# Patient Record
Sex: Male | Born: 1955 | Race: White | Hispanic: No | Marital: Married | Smoking: Former smoker
Health system: Southern US, Community
[De-identification: ages and names within clinical notes are randomized; demographics above are authoritative.]

## PROBLEM LIST (undated history)

## (undated) DIAGNOSIS — R338 Other retention of urine: Secondary | ICD-10-CM

## (undated) DIAGNOSIS — I10 Essential (primary) hypertension: Secondary | ICD-10-CM

## (undated) DIAGNOSIS — T4145XA Adverse effect of unspecified anesthetic, initial encounter: Secondary | ICD-10-CM

## (undated) DIAGNOSIS — R768 Other specified abnormal immunological findings in serum: Secondary | ICD-10-CM

## (undated) DIAGNOSIS — Z8679 Personal history of other diseases of the circulatory system: Secondary | ICD-10-CM

## (undated) DIAGNOSIS — Z960 Presence of urogenital implants: Secondary | ICD-10-CM

## (undated) DIAGNOSIS — N401 Enlarged prostate with lower urinary tract symptoms: Secondary | ICD-10-CM

## (undated) DIAGNOSIS — T8859XA Other complications of anesthesia, initial encounter: Secondary | ICD-10-CM

## (undated) DIAGNOSIS — Z860100 Personal history of colon polyps, unspecified: Secondary | ICD-10-CM

## (undated) DIAGNOSIS — Z978 Presence of other specified devices: Secondary | ICD-10-CM

## (undated) HISTORY — PX: INGUINAL HERNIA REPAIR: SUR1180

## (undated) HISTORY — DX: Essential (primary) hypertension: I10

## (undated) HISTORY — PX: ANAL FISSURE REPAIR: SHX2312

## (undated) HISTORY — PX: COLONOSCOPY WITH PROPOFOL: SHX5780

---

## 1999-08-27 ENCOUNTER — Emergency Department (HOSPITAL_COMMUNITY): Admission: EM | Admit: 1999-08-27 | Discharge: 1999-08-27 | Payer: Self-pay | Admitting: Emergency Medicine

## 2000-01-14 ENCOUNTER — Emergency Department (HOSPITAL_COMMUNITY): Admission: EM | Admit: 2000-01-14 | Discharge: 2000-01-14 | Payer: Self-pay

## 2000-07-31 ENCOUNTER — Encounter: Payer: Self-pay | Admitting: Neurology

## 2000-07-31 ENCOUNTER — Encounter: Admission: RE | Admit: 2000-07-31 | Discharge: 2000-07-31 | Payer: Self-pay | Admitting: Neurology

## 2003-11-17 ENCOUNTER — Observation Stay (HOSPITAL_COMMUNITY): Admission: EM | Admit: 2003-11-17 | Discharge: 2003-11-18 | Payer: Self-pay | Admitting: Emergency Medicine

## 2006-07-02 ENCOUNTER — Ambulatory Visit: Payer: Self-pay | Admitting: Family Medicine

## 2006-10-28 ENCOUNTER — Emergency Department (HOSPITAL_COMMUNITY): Admission: EM | Admit: 2006-10-28 | Discharge: 2006-10-28 | Payer: Self-pay | Admitting: Emergency Medicine

## 2008-05-18 ENCOUNTER — Emergency Department (HOSPITAL_COMMUNITY): Admission: EM | Admit: 2008-05-18 | Discharge: 2008-05-18 | Payer: Self-pay | Admitting: Emergency Medicine

## 2013-03-05 ENCOUNTER — Other Ambulatory Visit (HOSPITAL_COMMUNITY): Payer: Self-pay | Admitting: Urology

## 2013-03-05 DIAGNOSIS — D49519 Neoplasm of unspecified behavior of unspecified kidney: Secondary | ICD-10-CM

## 2013-03-12 ENCOUNTER — Ambulatory Visit (HOSPITAL_COMMUNITY): Admission: RE | Admit: 2013-03-12 | Payer: BC Managed Care – PPO | Source: Ambulatory Visit

## 2013-11-10 LAB — HM COLONOSCOPY

## 2013-12-01 DIAGNOSIS — M5136 Other intervertebral disc degeneration, lumbar region: Secondary | ICD-10-CM | POA: Insufficient documentation

## 2013-12-01 DIAGNOSIS — M51369 Other intervertebral disc degeneration, lumbar region without mention of lumbar back pain or lower extremity pain: Secondary | ICD-10-CM | POA: Insufficient documentation

## 2013-12-01 DIAGNOSIS — M5416 Radiculopathy, lumbar region: Secondary | ICD-10-CM | POA: Insufficient documentation

## 2015-05-15 ENCOUNTER — Encounter (HOSPITAL_COMMUNITY): Payer: Self-pay | Admitting: *Deleted

## 2015-05-15 ENCOUNTER — Emergency Department (HOSPITAL_COMMUNITY)
Admission: EM | Admit: 2015-05-15 | Discharge: 2015-05-15 | Disposition: A | Discharge status: Home / Self Care | Payer: BLUE CROSS/BLUE SHIELD | Attending: Emergency Medicine | Admitting: Emergency Medicine

## 2015-05-15 DIAGNOSIS — R339 Retention of urine, unspecified: Secondary | ICD-10-CM | POA: Diagnosis not present

## 2015-05-15 DIAGNOSIS — F17200 Nicotine dependence, unspecified, uncomplicated: Secondary | ICD-10-CM | POA: Insufficient documentation

## 2015-05-15 DIAGNOSIS — N401 Enlarged prostate with lower urinary tract symptoms: Secondary | ICD-10-CM | POA: Insufficient documentation

## 2015-05-15 DIAGNOSIS — R300 Dysuria: Secondary | ICD-10-CM | POA: Diagnosis present

## 2015-05-15 DIAGNOSIS — N400 Enlarged prostate without lower urinary tract symptoms: Secondary | ICD-10-CM

## 2015-05-15 LAB — COMPREHENSIVE METABOLIC PANEL
ALBUMIN: 4.2 g/dL (ref 3.5–5.0)
ALK PHOS: 63 U/L (ref 38–126)
ALT: 147 U/L — AB (ref 17–63)
ANION GAP: 11 (ref 5–15)
AST: 84 U/L — AB (ref 15–41)
BUN: 10 mg/dL (ref 6–20)
CHLORIDE: 103 mmol/L (ref 101–111)
CO2: 20 mmol/L — ABNORMAL LOW (ref 22–32)
Calcium: 9 mg/dL (ref 8.9–10.3)
Creatinine, Ser: 0.87 mg/dL (ref 0.61–1.24)
GFR calc Af Amer: >60 mL/min (ref >60)
GFR calc non Af Amer: >60 mL/min (ref >60)
Glucose, Bld: 158 mg/dL — ABNORMAL HIGH (ref 65–99)
Potassium: 3.7 mmol/L (ref 3.5–5.1)
SODIUM: 134 mmol/L — AB (ref 135–145)
Total Bilirubin: 1.5 mg/dL — ABNORMAL HIGH (ref 0.3–1.2)
Total Protein: 8 g/dL (ref 6.5–8.1)

## 2015-05-15 LAB — CBC WITH DIFFERENTIAL/PLATELET
BASOS ABS: 0 10*3/uL (ref 0.0–0.1)
BASOS PCT: 0 %
EOS ABS: 0 10*3/uL (ref 0.0–0.7)
Eosinophils Relative: 0 %
HCT: 43 % (ref 39.0–52.0)
HEMOGLOBIN: 15.4 g/dL (ref 13.0–17.0)
Lymphocytes Relative: 13 %
Lymphs Abs: 0.9 10*3/uL (ref 0.7–4.0)
MCH: 33.3 pg (ref 26.0–34.0)
MCHC: 35.8 g/dL (ref 30.0–36.0)
MCV: 93.1 fL (ref 78.0–100.0)
Monocytes Absolute: 0.2 10*3/uL (ref 0.1–1.0)
Monocytes Relative: 2 %
NEUTROS PCT: 85 %
Neutro Abs: 6.1 10*3/uL (ref 1.7–7.7)
Platelets: 160 10*3/uL (ref 150–400)
RBC: 4.62 MIL/uL (ref 4.22–5.81)
RDW: 12.3 % (ref 11.5–15.5)
WBC: 7.2 10*3/uL (ref 4.0–10.5)

## 2015-05-15 LAB — URINALYSIS, ROUTINE W REFLEX MICROSCOPIC
Bilirubin Urine: NEGATIVE
Glucose, UA: NEGATIVE mg/dL
Ketones, ur: 40 mg/dL — AB
LEUKOCYTES UA: NEGATIVE
NITRITE: NEGATIVE
Protein, ur: NEGATIVE mg/dL
SPECIFIC GRAVITY, URINE: 1.019 (ref 1.005–1.030)
pH: 6 (ref 5.0–8.0)

## 2015-05-15 LAB — URINE MICROSCOPIC-ADD ON

## 2015-05-15 MED ORDER — OXYCODONE-ACETAMINOPHEN 5-325 MG PO TABS: 1.0000 | ORAL_TABLET | Freq: Once | ORAL | Status: DC

## 2015-05-15 MED ORDER — ONDANSETRON 4 MG PO TBDP
8.0000 mg | ORAL_TABLET | Freq: Once | ORAL | Status: AC
  Administered 2015-05-15: 8 mg via ORAL
  Filled 2015-05-15: qty 2

## 2015-05-15 MED ORDER — KETOROLAC TROMETHAMINE 30 MG/ML IJ SOLN
60.0000 mg | Freq: Once | INTRAMUSCULAR | Status: DC
  Filled 2015-05-15: qty 2

## 2015-05-15 NOTE — Discharge Instructions (Signed)
Continue your Terazosin.  Follow up with Urology.  Acute Urinary Retention, Male Acute urinary retention is the temporary inability to urinate. This is a common problem in older men. As men age their prostates become larger and block the flow of urine from the bladder. This is usually a problem that has come on gradually.  HOME CARE INSTRUCTIONS If you are sent home with a Foley catheter and a drainage system, you will need to discuss the best course of action with your health care provider. While the catheter is in, maintain a good intake of fluids. Keep the drainage bag emptied and lower than your catheter. This is so that contaminated urine will not flow back into your bladder, which could lead to a urinary tract infection. There are two main types of drainage bags. One is a large bag that usually is used at night. It has a good capacity that will allow you to sleep through the night without having to empty it. The second type is called a leg bag. It has a smaller capacity, so it needs to be emptied more frequently. However, the main advantage is that it can be attached by a leg strap and can go underneath your clothing, allowing you the freedom to move about or leave your home. Only take over-the-counter or prescription medicines for pain, discomfort, or fever as directed by your health care provider.  SEEK MEDICAL CARE IF:  You develop a low-grade fever.  You experience spasms or leakage of urine with the spasms. SEEK IMMEDIATE MEDICAL CARE IF:  1. You develop chills or fever. 2. Your catheter stops draining urine. 3. Your catheter falls out. 4. You start to develop increased bleeding that does not respond to rest and increased fluid intake. MAKE SURE YOU: 1. Understand these instructions. 2. Will watch your condition. 3. Will get help right away if you are not doing well or get worse.   This information is not intended to replace advice given to you by your health care provider. Make  sure you discuss any questions you have with your health care provider.   Document Released: 08/21/2000 Document Revised: 09/29/2014 Document Reviewed: 10/24/2012 Elsevier Interactive Patient Education 2016 [employer]  Benign Prostatic Hypertrophy The prostate gland is part of the reproductive system of men. A normal prostate is about the size and shape of a walnut. The prostate gland produces a fluid that is mixed with sperm to make semen. This gland surrounds the urethra and is located in front of the rectum and just below the bladder. The bladder is where urine is stored. The urethra is the tube through which urine passes from the bladder to get out of the body. The prostate grows as a man ages. An enlarged prostate not caused by cancer is called benign prostatic hypertrophy (BPH). An enlarged prostate can press on the urethra. This can make it harder to pass urine. In the early stages of enlargement, the bladder can get by with a narrowed urethra by forcing the urine through. If the problem gets worse, medical or surgical treatment may be required.  This condition should be followed by your health care provider. The accumulation of urine in the bladder can cause infection. Back pressure and infection can progress to bladder damage and kidney (renal) failure. If needed, your health care provider may refer you to a specialist in kidney and prostate disease (urologist). CAUSES  BPH is a common health problem in men older than 50 years. This condition is a normal  part of aging. However, not all men will develop problems from this condition. If the enlargement grows away from the urethra, then there will not be any compression of the urethra and resistance to urine flow.If the growth is toward the urethra and compresses it, you will experience difficulty urinating.  SYMPTOMS   Not able to completely empty your bladder.  Getting up often during the night to urinate.  Need to urinate frequently  during the day.  Difficultly starting urine flow.  Decrease in size and strength of your urine stream.  Dribbling after urination.  Pain on urination (more common with infection).  Inability to pass urine. This needs immediate treatment.  The development of a urinary tract infection. DIAGNOSIS  These tests will help your health care provider understand your problem: 5. A thorough history and physical examination. 6. A urination history, with the number of times you urinate, the amounts of urine, the strength of the urine stream, and the feeling of emptiness or fullness after urinating. 7. A postvoid bladder scan that measures any amount of urine that may remain in your bladder after you finish urinating. 8. Digital rectal exam. In a rectal exam, your health care provider checks your prostate by putting a gloved, lubricated finger into your rectum to feel the back of your prostate gland. This exam detects the size of your gland and abnormal lumps or growths. 9. Exam of your urine (urinalysis). 10. Prostate specific antigen (PSA) screening. This is a blood test used to screen for prostate cancer. 11. Rectal ultrasonography. This test uses sound waves to electronically produce a picture of your prostate gland. TREATMENT  Once symptoms begin, your health care provider will monitor your condition. Of the men with this condition, one third will have symptoms that stabilize, one third will have symptoms that improve, and one third will have symptoms that progress in the first year. Mild symptoms may not need treatment. Simple observation and yearly exams may be all that is required. Medicines and surgery are options for more severe problems. Your health care provider can help you make an informed decision for what is best. Two classes of medicines are available for relief of prostate symptoms: 4. Medicines that shrink the prostate. This helps relieve symptoms. These medicines take time to work,  and it may be months before any improvement is seen. 1. Uncommon side effects include problems with sexual function. 5. Medicines to relax the muscle of the prostate. This also relieves the obstruction by reducing any compression on the urethra.This group of medicines work much faster than those that reduce the size of the prostate gland. Usually, one can experience improvement in days to weeks.. 1. Side effects can include dizziness, fatigue, lightheadedness, and retrograde ejaculation (diminished volume of ejaculate). Several types of surgical treatments are available for relief of prostate symptoms: 1. Transurethral resection of the prostate (TURP)--In this treatment, an instrument is inserted through opening at the tip of the penis. It is used to cut away pieces of the inner core of the prostate. The pieces are removed through the same opening of the penis. This removes the obstruction and helps get rid of the symptoms. 2. Transurethral incision (TUIP)--In this procedure, small cuts are made in the prostate. This lessens the prostates pressure on the urethra. 3. Transurethral microwave thermotherapy (TUMT)--This procedure uses microwaves to create heat. The heat destroys and removes a small amount of prostate tissue. 4. Transurethral needle ablation (TUNA)--This is a procedure that uses radio frequencies to do the  same as TUMT. 5. Interstitial laser coagulation (ILC)--This is a procedure that uses a laser to do the same as TUMT and TUNA. 6. Transurethral electrovaporization (TUVP)--This is a procedure that uses electrodes to do the same as the procedures listed above. SEEK MEDICAL CARE IF:  1. You develop a fever. 2. There is unexplained back pain. 3. Symptoms are not helped by medicines prescribed. 4. You develop side effects from the medicine you are taking. 5. Your urine becomes very dark or has a bad smell. 6. Your lower abdomen becomes distended and you have difficulty passing your  urine. SEEK IMMEDIATE MEDICAL CARE IF:   You are suddenly unable to urinate. This is an emergency. You should be seen immediately.  There are large amounts of blood or clots in the urine.  Your urinary problems become unmanageable.  You develop lightheadedness, severe dizziness, or you feel faint.  You develop moderate to severe low back or flank pain.  You develop chills or fever.   This information is not intended to replace advice given to you by your health care provider. Make sure you discuss any questions you have with your health care provider.   Document Released: 05/15/2005 Document Revised: 05/20/2013 Document Reviewed: 11/28/2012 Elsevier Interactive Patient Education 2016 [place], Adult A Foley catheter is a soft, flexible tube that is placed into the bladder to drain urine. A Foley catheter may be inserted if:  You leak urine or are not able to control when you urinate (urinary incontinence).  You are not able to urinate when you need to (urinary retention).  You had prostate surgery or surgery on the genitals.  You have certain medical conditions, such as multiple sclerosis, dementia, or a spinal cord injury. If you are going home with a Foley catheter in place, follow the instructions below. TAKING CARE OF THE CATHETER 12. Wash your hands with soap and water. 13. Using mild soap and warm water on a clean washcloth:  Clean the area on your body closest to the catheter insertion site using a circular motion, moving away from the catheter. Never wipe toward the catheter because this could sweep bacteria up into the urethra and cause infection.  Remove all traces of soap. Pat the area dry with a clean towel. For males, reposition the foreskin. 57. Attach the catheter to your leg so there is no tension on the catheter. Use adhesive tape or a leg strap. If you are using adhesive tape, remove any sticky residue left behind by the previous tape  you used. 15. Keep the drainage bag below the level of the bladder, but keep it off the floor. 16. Check throughout the day to be sure the catheter is working and urine is draining freely. Make sure the tubing does not become kinked. 17. Do not pull on the catheter or try to remove it. Pulling could damage internal tissues. TAKING CARE OF THE DRAINAGE BAGS You will be given two drainage bags to take home. One is a large overnight drainage bag, and the other is a smaller leg bag that fits underneath clothing. You may wear the overnight bag at any time, but you should never wear the smaller leg bag at night. Follow the instructions below for how to empty, change, and clean your drainage bags. Emptying the Drainage Bag You must empty your drainage bag when it is  - full or at least 2-3 times a day. 6. Wash your hands with soap and water. 7. Keep  the drainage bag below your hips, below the level of your bladder. This stops urine from going back into the tubing and into your bladder. 8. Hold the dirty bag over the toilet or a clean container. 9. Open the pour spout at the bottom of the bag and empty the urine into the toilet or container. Do not let the pour spout touch the toilet, container, or any other surface. Doing so can place bacteria on the bag, which can cause an infection. 10. Clean the pour spout with a gauze pad or cotton ball that has rubbing alcohol on it. 11. Close the pour spout. 12. Attach the bag to your leg with adhesive tape or a leg strap. 13. Wash your hands well. Changing the Drainage Bag Change your drainage bag once a month or sooner if it starts to smell bad or look dirty. Below are steps to follow when changing the drainage bag. 7. Wash your hands with soap and water. 8. Pinch off the rubber catheter so that urine does not spill out. 9. Disconnect the catheter tube from the drainage tube at the connection valve. Do not let the tubes touch any surface. 10. Clean the end of  the catheter tube with an alcohol wipe. Use a different alcohol wipe to clean the end of the drainage tube. 11. Connect the catheter tube to the drainage tube of the clean drainage bag. 12. Attach the new bag to the leg with adhesive tape or a leg strap. Avoid attaching the new bag too tightly. 13. Wash your hands well. Cleaning the Drainage Bag 7. Wash your hands with soap and water. 8. Wash the bag in warm, soapy water. 9. Rinse the bag thoroughly with warm water. 10. Fill the bag with a solution of white vinegar and water (1 cup vinegar to 1 qt warm water [.2 L vinegar to 1 L warm water]). Close the bag and soak it for 30 minutes in the solution. 11. Rinse the bag with warm water. 12. Hang the bag to dry with the pour spout open and hanging downward. 13. Store the clean bag (once it is dry) in a clean plastic bag. 14. Wash your hands well. PREVENTING INFECTION  Wash your hands before and after handling your catheter.  Take showers daily and wash the area where the catheter enters your body. Do not take baths. Replace wet leg straps with dry ones, if this applies.  Do not use powders, sprays, or lotions on the genital area. Only use creams, lotions, or ointments as directed by your caregiver.  For females, wipe from front to back after each bowel movement.  Drink enough fluids to keep your urine clear or pale yellow unless you have a fluid restriction.  Do not let the drainage bag or tubing touch or lie on the floor.  Wear cotton underwear to absorb moisture and to keep your skin drier. SEEK MEDICAL CARE IF:   Your urine is cloudy or smells unusually bad.  Your catheter becomes clogged.  You are not draining urine into the bag or your bladder feels full.  Your catheter starts to leak. SEEK IMMEDIATE MEDICAL CARE IF:   You have pain, swelling, redness, or pus where the catheter enters the body.  You have pain in the abdomen, legs, lower back, or bladder.  You have a  fever.  You see blood fill the catheter, or your urine is pink or red.  You have nausea, vomiting, or chills.  Your catheter gets pulled out.  MAKE SURE YOU:   Understand these instructions.  Will watch your condition.  Will get help right away if you are not doing well or get worse.   This information is not intended to replace advice given to you by your health care provider. Make sure you discuss any questions you have with your health care provider.   Document Released: 05/15/2005 Document Revised: 09/29/2013 Document Reviewed: 05/06/2012 Elsevier Interactive Patient Education [employer].

## 2015-05-15 NOTE — ED Notes (Addendum)
Pt states that the pain he is experiencing is making him nauseated.  Pt states that he has been constipated since this morning, pt attempted to give himself an enema, he states that this not relieve his pain or his constipation.

## 2015-05-15 NOTE — ED Notes (Signed)
The pt has had difficulty urinating since 0900am. He has been voiding in small amounts until 2100 and he has not been able to void since then.  Hx of prostate problems

## 2015-05-15 NOTE — ED Notes (Signed)
The pt and the pts wife want a note placed in the chart not to gived narcotics or sedatives of any type

## 2015-05-15 NOTE — ED Provider Notes (Signed)
By signing my name below, I, Irene Pap, attest that this documentation has been prepared under the direction and in the presence of Schleicher, DO. Electronically Signed: Irene Pap, ED Scribe. 05/15/2015. 3:54 AM.  TIME SEEN: 3:50 AM  CHIEF COMPLAINT: dysuria  HPI:  HPI Comments: Douglas Barnes is a 59 y.o. male history of hepatitis C, BPH and previous episode of urinary retention who presents to the Emergency Department complaining of difficulty urinating onset 19 hours ago. Pt has been having difficulty urinating since 9 AM when he was also constipated. He reports giving himself an enema to no relief. He reports that he has a hx of prostate problems and has been taking Terazosin, but reports that it has not worked today. He states that he has had a foley catheter once before. He reports that he has been voiding in small amounts until 9 PM last night, and has been unable to urinate since then. He reports associated nausea and has been taking ibuprofen to no relief. He states now in the ED that he feels much better after an in and out catheter which revealed 700 mL. He denies changes in medication, taking over-the-counter medication of ibuprofen, fever, chills, vomiting, diarrhea, bladder or bowel incontinence, back pain, numbness, weakness, or lightheadedness.  Denies alcohol or acetaminophen use.   ROS: See HPI Constitutional: no fever  Eyes: no drainage  ENT: no runny nose   Cardiovascular:  no chest pain  Resp: no SOB  GI: no vomiting GU: dysuria Integumentary: no rash  Allergy: no hives  Musculoskeletal: no leg swelling  Neurological: no slurred speech ROS otherwise negative  PAST MEDICAL HISTORY/PAST SURGICAL HISTORY:  History reviewed. No pertinent past medical history.  MEDICATIONS:  Prior to Admission medications   Not on File    ALLERGIES:  No Known Allergies  SOCIAL HISTORY:  Social History  Substance Use Topics  . Smoking status: Current Every  Day Smoker  . Smokeless tobacco: Not on file  . Alcohol Use: No    FAMILY HISTORY: No family history on file.  EXAM: BP 156/95 mmHg  Pulse 109  Temp(Src) 98.1 F (36.7 C)  Resp 22  Ht 5\' 8"  (1.727 m)  Wt 167 lb 7 oz (75.949 kg)  BMI 25.46 kg/m2  SpO2 96% CONSTITUTIONAL: Alert and oriented and responds appropriately to questions. Well-appearing; well-nourished HEAD: Normocephalic EYES: Conjunctivae clear, PERRL ENT: normal nose; no rhinorrhea; moist mucous membranes; pharynx without lesions noted NECK: Supple, no meningismus, no LAD  CARD: RRR; S1 and S2 appreciated; no murmurs, no clicks, no rubs, no gallops RESP: Normal chest excursion without splinting or tachypnea; breath sounds clear and equal bilaterally; no wheezes, no rhonchi, no rales, no hypoxia or respiratory distress, speaking full sentences ABD/GI: Normal bowel sounds; non-distended; soft, non-tender, no rebound, no guarding, no peritoneal signs BACK:  The back appears normal and is non-tender to palpation, there is no CVA tenderness EXT: Normal ROM in all joints; non-tender to palpation; no edema; normal capillary refill; no cyanosis, no calf tenderness or swelling    SKIN: Normal color for age and race; warm NEURO: Moves all extremities equally, sensation to light touch intact diffusely, cranial nerves II through XII intact, normal gait PSYCH: The patient's mood and manner are appropriate. Grooming and personal hygiene are appropriate.  MEDICAL DECISION MAKING: Patient here with urinary retention from BPH. Bladder scan showed only 100 mL of urine. Given he appeared uncomfortable, I&O catheterization performed, and patient voided approximate 700 mL.  He  reports feeling much better. Have offered him a Foley catheter which he refuses. Urine shows blood but no other sign of infection other than bacteria. Culture is pending. Hemodynamically stable. Labs show mild elevation of his AST, ALT and total bilirubin which he reports  is chronic because of his hepatitis C. No right upper quadrant tenderness on exam.  ED PROGRESS: Patient reports that he feels that he needs to urinate again and is unable to do so. He is now requesting that we place a Foley catheter. We'll also give him a leg bag. He has urology follow-up. Discussed return precautions and Foley catheter care. He verbalized understanding and is comfortable with this plan.     I personally performed the services described in this documentation, which was scribed in my presence. The recorded information has been reviewed and is accurate.    [place], DO 05/15/15 [phone]

## 2015-05-16 LAB — URINE CULTURE: CULTURE: NO GROWTH

## 2015-05-26 ENCOUNTER — Encounter (HOSPITAL_COMMUNITY): Payer: Self-pay | Admitting: Emergency Medicine

## 2015-05-26 ENCOUNTER — Emergency Department (HOSPITAL_COMMUNITY)
Admission: EM | Admit: 2015-05-26 | Discharge: 2015-05-26 | Disposition: A | Discharge status: Home / Self Care | Payer: BLUE CROSS/BLUE SHIELD | Attending: Emergency Medicine | Admitting: Emergency Medicine

## 2015-05-26 DIAGNOSIS — R7401 Elevation of levels of liver transaminase levels: Secondary | ICD-10-CM

## 2015-05-26 DIAGNOSIS — R110 Nausea: Secondary | ICD-10-CM | POA: Insufficient documentation

## 2015-05-26 DIAGNOSIS — N401 Enlarged prostate with lower urinary tract symptoms: Secondary | ICD-10-CM | POA: Insufficient documentation

## 2015-05-26 DIAGNOSIS — Z87438 Personal history of other diseases of male genital organs: Secondary | ICD-10-CM

## 2015-05-26 DIAGNOSIS — F17200 Nicotine dependence, unspecified, uncomplicated: Secondary | ICD-10-CM | POA: Diagnosis not present

## 2015-05-26 DIAGNOSIS — R339 Retention of urine, unspecified: Secondary | ICD-10-CM | POA: Insufficient documentation

## 2015-05-26 DIAGNOSIS — R740 Nonspecific elevation of levels of transaminase and lactic acid dehydrogenase [LDH]: Secondary | ICD-10-CM

## 2015-05-26 DIAGNOSIS — R000 Tachycardia, unspecified: Secondary | ICD-10-CM | POA: Insufficient documentation

## 2015-05-26 DIAGNOSIS — R1030 Lower abdominal pain, unspecified: Secondary | ICD-10-CM | POA: Diagnosis present

## 2015-05-26 LAB — COMPREHENSIVE METABOLIC PANEL
ALBUMIN: 4.1 g/dL (ref 3.5–5.0)
ALT: 125 U/L — ABNORMAL HIGH (ref 17–63)
ANION GAP: 12 (ref 5–15)
AST: 75 U/L — AB (ref 15–41)
Alkaline Phosphatase: 58 U/L (ref 38–126)
BILIRUBIN TOTAL: 0.8 mg/dL (ref 0.3–1.2)
BUN: 8 mg/dL (ref 6–20)
CO2: 21 mmol/L — AB (ref 22–32)
Calcium: 9.2 mg/dL (ref 8.9–10.3)
Chloride: 107 mmol/L (ref 101–111)
Creatinine, Ser: 0.92 mg/dL (ref 0.61–1.24)
GFR calc Af Amer: >60 mL/min (ref >60)
GFR calc non Af Amer: >60 mL/min (ref >60)
GLUCOSE: 154 mg/dL — AB (ref 65–99)
POTASSIUM: 3.7 mmol/L (ref 3.5–5.1)
SODIUM: 140 mmol/L (ref 135–145)
TOTAL PROTEIN: 8 g/dL (ref 6.5–8.1)

## 2015-05-26 LAB — URINALYSIS, ROUTINE W REFLEX MICROSCOPIC
Bilirubin Urine: NEGATIVE
Glucose, UA: NEGATIVE mg/dL
KETONES UR: 15 mg/dL — AB
LEUKOCYTES UA: NEGATIVE
NITRITE: NEGATIVE
PH: 5 (ref 5.0–8.0)
Protein, ur: NEGATIVE mg/dL
SPECIFIC GRAVITY, URINE: 1.013 (ref 1.005–1.030)

## 2015-05-26 LAB — CBC WITH DIFFERENTIAL/PLATELET
BASOS ABS: 0 10*3/uL (ref 0.0–0.1)
BASOS PCT: 0 %
Eosinophils Absolute: 0 10*3/uL (ref 0.0–0.7)
Eosinophils Relative: 0 %
HEMATOCRIT: 45.1 % (ref 39.0–52.0)
Hemoglobin: 15.7 g/dL (ref 13.0–17.0)
Lymphocytes Relative: 16 %
Lymphs Abs: 1.5 10*3/uL (ref 0.7–4.0)
MCH: 32.5 pg (ref 26.0–34.0)
MCHC: 34.8 g/dL (ref 30.0–36.0)
MCV: 93.4 fL (ref 78.0–100.0)
MONO ABS: 0.5 10*3/uL (ref 0.1–1.0)
Monocytes Relative: 5 %
NEUTROS ABS: 7.4 10*3/uL (ref 1.7–7.7)
NEUTROS PCT: 79 %
Platelets: 173 10*3/uL (ref 150–400)
RBC: 4.83 MIL/uL (ref 4.22–5.81)
RDW: 12.5 % (ref 11.5–15.5)
WBC: 9.4 10*3/uL (ref 4.0–10.5)

## 2015-05-26 LAB — URINE MICROSCOPIC-ADD ON

## 2015-05-26 LAB — LIPASE, BLOOD: Lipase: 27 U/L (ref 11–51)

## 2015-05-26 NOTE — ED Notes (Signed)
Pt from home for eval of urinary retention, pt states hx of BPH and states had catheter taken out yesterday, was able to urinate last night but has not been able to urinate since 0900 this morning. Refusal of pain meds in triage, pt unable to sit due to pain.

## 2015-05-26 NOTE — Discharge Instructions (Signed)
Keep your foley in place until you see your urologist. Continue your home medications for BPH. Follow up with [facility] in the morning at your already scheduled appointment. Use tylenol or motrin as needed for pain. Return to the ER for changes or worsening symptoms.   Acute Urinary Retention, Male Acute urinary retention is when you are unable to pee (urinate). Acute urinary retention is common in older men. Prostates can get bigger, which blocks the flow of pee.  HOME CARE  Drink enough fluids to keep your pee clear or pale yellow.  If you are sent home with a tube that drains the bladder (catheter), there will be a drainage bag attached to it. There are two types of bags. One is big that you can wear at night without having to empty it. One is smaller and needs to be emptied more often.  Keep the drainage bag empty.  Keep the drainage bag lower than your catheter.  Only take medicine as told by your doctor. GET HELP IF:  You have a low-grade fever.  You have spasms or you are leaking pee when you have spasms. GET HELP RIGHT AWAY IF:   You have chills or a fever.  Your catheter stops draining pee.  Your catheter falls out.  You have increased bleeding that does not stop after you have rested and increased the amount of fluids you had been drinking. MAKE SURE YOU:   Understand these instructions.  Will watch your condition.  Will get help right away if you are not doing well or get worse.   This information is not intended to replace advice given to you by your health care provider. Make sure you discuss any questions you have with your health care provider.   Document Released: 11/01/2007 Document Revised: 09/29/2014 Document Reviewed: 10/24/2012 Elsevier Interactive Patient Education 2016 Else[employer] Inc.  Nausea, Adult Nausea means you feel sick to your stomach or need to throw up (vomit). It may be a sign of a more serious problem. If nausea gets worse, you may  throw up. If you throw up a lot, you may lose too much body fluid (dehydration). HOME CARE   Get plenty of rest.  Ask your doctor how to replace body fluid losses (rehydrate).  Eat small amounts of food. Sip liquids more often.  Take all medicines as told by your doctor. GET HELP RIGHT AWAY IF:  You have a fever.  You pass out (faint).  You keep throwing up or have blood in your throw up.  You are very weak, have dry lips or a dry mouth, or you are very thirsty (dehydrated).  You have dark or bloody poop (stool).  You have very bad chest or belly (abdominal) pain.  You do not get better after 2 days, or you get worse.  You have a headache. MAKE SURE YOU:  Understand these instructions.  Will watch your condition.  Will get help right away if you are not doing well or get worse.   This information is not intended to replace advice given to you by your health care provider. Make sure you discuss any questions you have with your health care provider.   Document Released: 05/04/2011 Document Revised: 08/07/2011 Document Reviewed: 05/04/2011 Elsevier Interactive Patient Education [employer].

## 2015-05-26 NOTE — ED Provider Notes (Signed)
CSN: [id number]     Arrival date & time 05/26/15  1833 History   First MD Initiated Contact with Patient 05/26/15 1959     Chief Complaint  Patient presents with  . Urinary Retention     (Consider location/radiation/quality/duration/timing/severity/associated sxs/prior Treatment) HPI Comments: Douglas Barnes is a 59 y.o. male with a PMHx of hepC, BPH, and recurrent urinary retention, who presents to the ED with complaints of urinary retention 10 hours. Patient had his Foley catheter removed yesterday at 8 AM at [facility], he was able to urinate until 9 AM this morning and he began having urinary retention. He tried taking Rapaflo which did not help his symptoms. Associated symptoms include 10/10 suprapubic pressure pain which is constant and radiating into his lower back, worse with feeling the urge to urinate, and unrelieved with ibuprofen. Associated symptoms include nausea and increased urgency and frequency without any urine produced. He has an appointment with [facility] tomorrow but was told to come to the ER for any urinary retention after hours.   He denies fevers, chills, CP, SOB, vomiting, diarrhea, constipation, obstipation, melena, hematochezia, hematuria in yesterday's urine, testicular pain/swelling, penile discharge, rectal pain, myalgias, arthralgias, numbness, tingling, weakness, cauda equina symptoms/perianal numbness.   The history is provided by the patient and medical records. No language interpreter was used.    Past Medical History  Diagnosis Date  . BPH (benign prostatic hyperplasia)    History reviewed. No pertinent past surgical history. No family history on file. Social History  Substance Use Topics  . Smoking status: Current Every Day Smoker  . Smokeless tobacco: None  . Alcohol Use: No    Review of Systems  Constitutional: Negative for fever and chills.  Respiratory: Negative for shortness of breath.   Cardiovascular: Negative for  chest pain.  Gastrointestinal: Positive for nausea and abdominal pain (suprapubic). Negative for vomiting, diarrhea, constipation, blood in stool and rectal pain.  Genitourinary: Positive for frequency, decreased urine volume (retention) and difficulty urinating. Negative for hematuria, flank pain, discharge, scrotal swelling and testicular pain.  Musculoskeletal: Negative for myalgias and arthralgias.  Skin: Negative for color change.  Allergic/Immunologic: Negative for immunocompromised state.  Neurological: Negative for weakness and numbness.  Psychiatric/Behavioral: Negative for confusion.   10 Systems reviewed and are negative for acute change except as noted in the HPI.    Allergies  Review of patient's allergies indicates no known allergies.  Home Medications   Prior to Admission medications   Medication Sig Start Date End Date Taking? Authorizing Provider  terazosin (HYTRIN) 5 MG capsule Take 5 mg by mouth 2 (two) times daily.    Historical Provider, MD   BP 134/80 mmHg  Pulse 120  Temp(Src) 98 F (36.7 C) (Oral)  Resp 16  Ht 5\' 8"  (1.727 m)  Wt 74.844 kg  BMI 25.09 kg/m2  SpO2 97% Physical Exam  Constitutional: He is oriented to person, place, and time. He appears well-developed and well-nourished.  Non-toxic appearance. He appears distressed.  Afebrile, nontoxic, appears distressed, in pain pacing around the room  HENT:  Head: Normocephalic and atraumatic.  Mouth/Throat: Oropharynx is clear and moist and mucous membranes are normal.  Eyes: Conjunctivae and EOM are normal. Right eye exhibits no discharge. Left eye exhibits no discharge.  Neck: Normal range of motion. Neck supple.  Cardiovascular: Regular rhythm, normal heart sounds and intact distal pulses.  Tachycardia present.  Exam reveals no gallop and no friction rub.   No murmur heard. Tachycardic in the  110-120s, likely from pain. Reg rhythm, nl s1/s2, no m/r/g, distal pulses intact, no pedal edema    Pulmonary/Chest: Effort normal and breath sounds normal. No respiratory distress. He has no decreased breath sounds. He has no wheezes. He has no rhonchi. He has no rales.  Abdominal: Soft. Normal appearance and bowel sounds are normal. He exhibits no distension. There is tenderness in the suprapubic area. There is no rigidity, no rebound, no guarding, no CVA tenderness, no tenderness at McBurney's point and negative Murphy's sign.    Soft, nondistended, +BS throughout, with moderate suprapubic TTP, no r/g/r, neg murphy's, neg mcburney's, no CVA TTP   Musculoskeletal: Normal range of motion.  Neurological: He is alert and oriented to person, place, and time. He has normal strength. No sensory deficit.  Skin: Skin is warm, dry and intact. No rash noted.  Psychiatric: He has a normal mood and affect.  Nursing note and vitals reviewed.   ED Course  Procedures (including critical care time)  Genitourinary - Bladder Scan Volume (mL): 500 mL  Labs Review Labs Reviewed  COMPREHENSIVE METABOLIC PANEL - Abnormal; Notable for the following:    CO2 21 (*)    Glucose, Bld 154 (*)    AST 75 (*)    ALT 125 (*)    All other components within normal limits  URINALYSIS, ROUTINE W REFLEX MICROSCOPIC (NOT AT [facility]) - Abnormal; Notable for the following:    Hgb urine dipstick LARGE (*)    Ketones, ur 15 (*)    All other components within normal limits  URINE MICROSCOPIC-ADD ON - Abnormal; Notable for the following:    Squamous Epithelial / LPF 0-5 (*)    Bacteria, UA RARE (*)    All other components within normal limits  URINE CULTURE  CBC WITH DIFFERENTIAL/PLATELET  LIPASE, BLOOD    Imaging Review No results found. I have personally reviewed and evaluated these images and lab results as part of my medical decision-making.   EKG Interpretation None      MDM   Final diagnoses:  Urinary retention  Elevated transaminase level  Nausea  History of BPH    59 y.o. male here with  recurrent urinary retention. Appears uncomfortable, pacing room. Known hx of BPH. Had foley removed yesterday, urinated well until 9am today. +Suprapubic tenderness and pain. Pt declines wanting pain medications, just wants foley placed. Will obtain bladder scan, basic labs to eval for other etiologies of abd pain as well as check kidney function, and place foley. Will reassess shortly.   9:58 PM Bladder scan revealed 554mL, foley placed without incidence and draining without issue, no ongoing pain or nausea. CBC w/diff unremarkable. CMP with chronically elevated AST/ALT, unchanged from prior evals. Lipase WNL. U/A with hgb, 6-30 RBCs, 0-5 squamous and WBC but rare bacteria. Doubt this is UTI. More likely due to BPH. Will d/c home with foley in place. F/up with [facility] tomorrow. Tylenol/motrin for pain. Continue home terazosin. I explained the diagnosis and have given explicit precautions to return to the ER including for any other new or worsening symptoms. The patient understands and accepts the medical plan as it's been dictated and I have answered their questions. Discharge instructions concerning home care and prescriptions have been given. The patient is STABLE and is discharged to home in good condition.  BP 121/80 mmHg  Pulse 105  Temp(Src) 98 F (36.7 C) (Oral)  Resp 16  Ht 5\' 8"  (1.727 m)  Wt 74.844 kg  BMI 25.09 kg/m2  SpO2 97%    Tamika Nou Camprubi-Soms, PA-C 05/26/15 [place], MD 06/04/15 [phone]

## 2015-05-28 LAB — URINE CULTURE: Culture: NO GROWTH

## 2015-05-30 ENCOUNTER — Encounter (HOSPITAL_COMMUNITY): Payer: Self-pay | Admitting: Nurse Practitioner

## 2015-05-30 ENCOUNTER — Emergency Department (HOSPITAL_COMMUNITY)
Admission: EM | Admit: 2015-05-30 | Discharge: 2015-05-30 | Disposition: A | Discharge status: Home / Self Care | Payer: BLUE CROSS/BLUE SHIELD | Attending: Emergency Medicine | Admitting: Emergency Medicine

## 2015-05-30 DIAGNOSIS — N400 Enlarged prostate without lower urinary tract symptoms: Secondary | ICD-10-CM | POA: Diagnosis not present

## 2015-05-30 DIAGNOSIS — F17200 Nicotine dependence, unspecified, uncomplicated: Secondary | ICD-10-CM | POA: Insufficient documentation

## 2015-05-30 DIAGNOSIS — R1030 Lower abdominal pain, unspecified: Secondary | ICD-10-CM | POA: Insufficient documentation

## 2015-05-30 DIAGNOSIS — Z79899 Other long term (current) drug therapy: Secondary | ICD-10-CM | POA: Diagnosis not present

## 2015-05-30 DIAGNOSIS — R339 Retention of urine, unspecified: Secondary | ICD-10-CM | POA: Diagnosis not present

## 2015-05-30 DIAGNOSIS — R000 Tachycardia, unspecified: Secondary | ICD-10-CM | POA: Insufficient documentation

## 2015-05-30 DIAGNOSIS — R338 Other retention of urine: Secondary | ICD-10-CM

## 2015-05-30 LAB — I-STAT CHEM 8, ED
BUN: 11 mg/dL (ref 6–20)
CALCIUM ION: 1.06 mmol/L — AB (ref 1.12–1.23)
CHLORIDE: 101 mmol/L (ref 101–111)
Creatinine, Ser: 0.9 mg/dL (ref 0.61–1.24)
GLUCOSE: 135 mg/dL — AB (ref 65–99)
HCT: 49 % (ref 39.0–52.0)
Hemoglobin: 16.7 g/dL (ref 13.0–17.0)
Potassium: 3.5 mmol/L (ref 3.5–5.1)
SODIUM: 140 mmol/L (ref 135–145)
TCO2: 22 mmol/L (ref 0–100)

## 2015-05-30 LAB — URINALYSIS, ROUTINE W REFLEX MICROSCOPIC
Bilirubin Urine: NEGATIVE
Glucose, UA: NEGATIVE mg/dL
KETONES UR: NEGATIVE mg/dL
NITRITE: POSITIVE — AB
Protein, ur: NEGATIVE mg/dL
Specific Gravity, Urine: 1.006 (ref 1.005–1.030)
pH: 5.5 (ref 5.0–8.0)

## 2015-05-30 LAB — URINE MICROSCOPIC-ADD ON

## 2015-05-30 MED ORDER — HYOSCYAMINE SULFATE 0.125 MG SL SUBL: 0.1250 mg | SUBLINGUAL_TABLET | SUBLINGUAL | Status: DC | PRN

## 2015-05-30 MED ORDER — HYOSCYAMINE SULFATE 0.125 MG SL SUBL
0.1250 mg | SUBLINGUAL_TABLET | Freq: Once | SUBLINGUAL | Status: AC
  Administered 2015-05-30: 0.125 mg via SUBLINGUAL

## 2015-05-30 MED ORDER — HYOSCYAMINE SULFATE 0.125 MG PO TABS
0.1250 mg | ORAL_TABLET | Freq: Once | ORAL | Status: DC
  Filled 2015-05-30: qty 1

## 2015-05-30 NOTE — ED Notes (Addendum)
Attempted to flush pt Urethral Catheter with no success. PA [place] notified. Instructed to remove old catheter place new catheter.No need to bladder scan per PA Elmyra Ricks due to clinical presentation and distended suprapubic area.

## 2015-05-30 NOTE — ED Notes (Addendum)
He had catheter placed in ER on Thursday for enlarged prostate. Today he woke and felt symptoms like a UTI, took his terazosin and AZO, reports decreased urinary output throughout the day and has been unable to void at all since 11 am despite forcing fluids. Denies n/v/fevesr. Urology appt scheduled for tuesday

## 2015-05-30 NOTE — ED Provider Notes (Signed)
CSN: [id number]     Arrival date & time 05/30/15  1520 History   First MD Initiated Contact with Patient 05/30/15 1642     Chief Complaint  Patient presents with  . Urinary Retention     (Consider location/radiation/quality/duration/timing/severity/associated sxs/prior Treatment) HPI   Blood pressure 157/126, pulse 132, temperature 97.7 F (36.5 C), temperature source Oral, resp. rate 18, height 5\' 8"  (1.727 m), weight 75.467 kg, SpO2 96 %.  Douglas Barnes is a 60 y.o. male complaining of abdominal pain, nausea and urinary retention, has not urinated since 11 AM this morning and patient states he's been drinking a lot of water. He woke up this morning with some urethral burning sensation and discomfort so he took [employer] and Azo. Patient denies flank pain, emesis, chest pain, shortness of breath. He's followed by Dr. Junious Silk with whom he has an appointment in 2 days. Catheter was placed in the ED 4 days ago.  Past Medical History  Diagnosis Date  . BPH (benign prostatic hyperplasia)    History reviewed. No pertinent past surgical history. History reviewed. No pertinent family history. Social History  Substance Use Topics  . Smoking status: Current Every Day Smoker  . Smokeless tobacco: None  . Alcohol Use: No    Review of Systems  10 systems reviewed and found to be negative, except as noted in the HPI.   Allergies  Review of patient's allergies indicates no known allergies.  Home Medications   Prior to Admission medications   Medication Sig Start Date End Date Taking? Authorizing Provider  hyoscyamine (LEVSIN/SL) 0.125 MG SL tablet Place 1 tablet (0.125 mg total) under the tongue every 4 (four) hours as needed. Do not take more than 1.5 mg per day. 05/30/15   Tamar Miano, PA-C  ibuprofen (ADVIL,MOTRIN) 200 MG tablet Take 400 mg by mouth every 6 (six) hours as needed for moderate pain.     Historical Provider, MD  silodosin (RAPAFLO) 8 MG CAPS capsule Take 8  mg by mouth once.    Historical Provider, MD  terazosin (HYTRIN) 5 MG capsule Take 5 mg by mouth 2 (two) times daily.    Historical Provider, MD   BP 123/84 mmHg  Pulse 86  Temp(Src) 97.9 F (36.6 C) (Oral)  Resp 14  Ht 5\' 8"  (1.727 m)  Wt 75.467 kg  BMI 25.30 kg/m2  SpO2 94% Physical Exam  Constitutional: He is oriented to person, place, and time. He appears well-developed and well-nourished.  Pacing appears acutely uncomfortable  HENT:  Head: Normocephalic.  Eyes: Conjunctivae and EOM are normal.  Cardiovascular:  Tachycardia to the 130s  Pulmonary/Chest: Effort normal. No stridor.  Abdominal: Soft. He exhibits no distension and no mass. There is tenderness. There is no rebound and no guarding.  Suprapubic tenderness to palpation with no guarding or rebound.  Musculoskeletal: Normal range of motion.  Neurological: He is alert and oriented to person, place, and time.  Psychiatric: He has a normal mood and affect.  Nursing note and vitals reviewed.   ED Course  Procedures (including critical care time) Labs Review Labs Reviewed  URINALYSIS, ROUTINE W REFLEX MICROSCOPIC (NOT AT [facility]) - Abnormal; Notable for the following:    Color, Urine ORANGE (*)    APPearance CLOUDY (*)    Hgb urine dipstick LARGE (*)    Nitrite POSITIVE (*)    Leukocytes, UA TRACE (*)    All other components within normal limits  URINE MICROSCOPIC-ADD ON - Abnormal; Notable for the  following:    Squamous Epithelial / LPF 0-5 (*)    Bacteria, UA RARE (*)    All other components within normal limits  I-STAT CHEM 8, ED - Abnormal; Notable for the following:    Glucose, Bld 135 (*)    Calcium, Ion 1.06 (*)    All other components within normal limits  URINE CULTURE    Imaging Review No results found. I have personally reviewed and evaluated these images and lab results as part of my medical decision-making.   EKG Interpretation None      MDM   Final diagnoses:  Acute urinary retention    Filed Vitals:   05/30/15 1524 05/30/15 1816  BP: 157/126 123/84  Pulse: 132 86  Temp: 97.7 F (36.5 C) 97.9 F (36.6 C)  TempSrc: Oral Oral  Resp: 18 14  Height: 5\' 8"  (1.727 m)   Weight: 75.467 kg   SpO2: 96% 94%    Medications  hyoscyamine (LEVSIN SL) SL tablet 0.125 mg (0.125 mg Sublingual Given 05/30/15 1754)    Douglas Barnes is 60 y.o. male presenting with acute urinary retention since 29 AM, patient is agitated, abdominal exam tenderness, we tried to flush the Foley with no effect, Foley was changed out and patient had 700 mL of urine, he has no elevation in his creatinine. Urinalysis is confounded by the Azo that he took, urine culture is pending but the burning and discomfort sensation that he was feeling prior to the Foley clogging has resolved with insertion of the new Foley. I think that these symptoms may be related to irritation from the Foley and will start him on Levsin, we'll hold off on antibiotics at this time. Patient and his wife have expressed concern that when they go to urology they would like to see the urologist, he has an appointment tomorrow morning. I've advised them to simply asked at [facility] if they can either see the urologist or make an appointment to be seen by the urologist rather than changing out the Foley.  Evaluation does not show pathology that would require ongoing emergent intervention or inpatient treatment. Pt is hemodynamically stable and mentating appropriately. Discussed findings and plan with patient/guardian, who agrees with care plan. All questions answered. Return precautions discussed and outpatient follow up given.   Discharge Medication List as of 05/30/2015  6:19 PM    START taking these medications   Details  hyoscyamine (LEVSIN/SL) 0.125 MG SL tablet Place 1 tablet (0.125 mg total) under the tongue every 4 (four) hours as needed. Do not take more than 1.5 mg per day., Starting 05/30/2015, Until Discontinued, [employer], PA-C 05/30/15 [place], MD 06/02/15 [id number]

## 2015-05-30 NOTE — ED Notes (Signed)
Still awaiting meds from pharmacy.  Called pharmacy and they will send medication.

## 2015-05-30 NOTE — Discharge Instructions (Signed)
Please follow with your primary care doctor in the next 2 days for a check-up. They must obtain records for further management.   Do not hesitate to return to the Emergency Department for any new, worsening or concerning symptoms.    Acute Urinary Retention, Male Acute urinary retention is the temporary inability to urinate. This is a common problem in older men. As men age their prostates become larger and block the flow of urine from the bladder. This is usually a problem that has come on gradually.  HOME CARE INSTRUCTIONS If you are sent home with a Foley catheter and a drainage system, you will need to discuss the best course of action with your health care provider. While the catheter is in, maintain a good intake of fluids. Keep the drainage bag emptied and lower than your catheter. This is so that contaminated urine will not flow back into your bladder, which could lead to a urinary tract infection. There are two main types of drainage bags. One is a large bag that usually is used at night. It has a good capacity that will allow you to sleep through the night without having to empty it. The second type is called a leg bag. It has a smaller capacity, so it needs to be emptied more frequently. However, the main advantage is that it can be attached by a leg strap and can go underneath your clothing, allowing you the freedom to move about or leave your home. Only take over-the-counter or prescription medicines for pain, discomfort, or fever as directed by your health care provider.  SEEK MEDICAL CARE IF:  You develop a low-grade fever.  You experience spasms or leakage of urine with the spasms. SEEK IMMEDIATE MEDICAL CARE IF:   You develop chills or fever.  Your catheter stops draining urine.  Your catheter falls out.  You start to develop increased bleeding that does not respond to rest and increased fluid intake. MAKE SURE YOU:  Understand these instructions.  Will watch your  condition.  Will get help right away if you are not doing well or get worse.   This information is not intended to replace advice given to you by your health care provider. Make sure you discuss any questions you have with your health care provider.   Document Released: 08/21/2000 Document Revised: 09/29/2014 Document Reviewed: 10/24/2012 Elsevier Interactive Patient Education [employer].

## 2015-06-01 LAB — URINE CULTURE: Culture: NO GROWTH

## 2015-06-21 ENCOUNTER — Emergency Department (HOSPITAL_COMMUNITY)
Admission: EM | Admit: 2015-06-21 | Discharge: 2015-06-21 | Disposition: A | Discharge status: Home / Self Care | Payer: BLUE CROSS/BLUE SHIELD | Attending: Emergency Medicine | Admitting: Emergency Medicine

## 2015-06-21 ENCOUNTER — Encounter (HOSPITAL_COMMUNITY): Payer: Self-pay | Admitting: Emergency Medicine

## 2015-06-21 DIAGNOSIS — N400 Enlarged prostate without lower urinary tract symptoms: Secondary | ICD-10-CM | POA: Insufficient documentation

## 2015-06-21 DIAGNOSIS — R39198 Other difficulties with micturition: Secondary | ICD-10-CM | POA: Insufficient documentation

## 2015-06-21 DIAGNOSIS — R339 Retention of urine, unspecified: Secondary | ICD-10-CM | POA: Diagnosis not present

## 2015-06-21 DIAGNOSIS — F17200 Nicotine dependence, unspecified, uncomplicated: Secondary | ICD-10-CM | POA: Diagnosis not present

## 2015-06-21 DIAGNOSIS — R109 Unspecified abdominal pain: Secondary | ICD-10-CM | POA: Insufficient documentation

## 2015-06-21 LAB — URINALYSIS, ROUTINE W REFLEX MICROSCOPIC
BILIRUBIN URINE: NEGATIVE
Glucose, UA: NEGATIVE mg/dL
Ketones, ur: NEGATIVE mg/dL
Nitrite: NEGATIVE
PH: 6 (ref 5.0–8.0)
Protein, ur: NEGATIVE mg/dL
SPECIFIC GRAVITY, URINE: 1.007 (ref 1.005–1.030)

## 2015-06-21 LAB — URINE MICROSCOPIC-ADD ON: Squamous Epithelial / LPF: NONE SEEN

## 2015-06-21 NOTE — ED Notes (Signed)
Pt has BPH. Pt was seen at doctor today and scheduled surgery and asked his doctor to remove the foley that he had in place due to penis irritation. Since leaving doctors office, pt began to have increased pain in his bladder. Pt was in severe pain upon entering the ED and a bladder scan showed >200 mL of urine. Pt was taken back to room and immediately cathed with a 16 foley per MD Jakubowitz order. Pt had immediate relief and foley is still draining at this time. A&Ox4 and ambulatory.

## 2015-06-21 NOTE — ED Notes (Signed)
Bed: [id number] Expected date:  Expected time:  Means of arrival:  Comments: tri

## 2015-06-21 NOTE — ED Provider Notes (Signed)
CSN: [id number]     Arrival date & time 06/21/15  1742 History   First MD Initiated Contact with Patient 06/21/15 1831     Chief Complaint  Patient presents with  . Urinary Retention     (Consider location/radiation/quality/duration/timing/severity/associated sxs/prior Treatment) HPI Patient had chronic indwelling Foley catheter removed today. He was instructed to self catheterize by his urologist. However when he self catheterized he noted blood clots and blood clots would not pass through catheter. He presents with feeling of abdominal discomfort and distention typical urinary retention he's had in the past. No treatment prior to coming here. Discomfort is severe. Nothing makes symptoms better or worse. Abdominal discomfort is just diffuse Past Medical History  Diagnosis Date  . BPH (benign prostatic hyperplasia)    History reviewed. No pertinent past surgical history. No family history on file. Social History  Substance Use Topics  . Smoking status: Current Every Day Smoker  . Smokeless tobacco: None  . Alcohol Use: No    Review of Systems  Constitutional: Negative.   HENT: Negative.   Respiratory: Negative.   Cardiovascular: Negative.   Gastrointestinal: Negative.   Genitourinary: Positive for difficulty urinating.  Musculoskeletal: Negative.   Skin: Negative.   Neurological: Negative.   Psychiatric/Behavioral: Negative.   All other systems reviewed and are negative.     Allergies  Review of patient's allergies indicates no known allergies.  Home Medications   Prior to Admission medications   Medication Sig Start Date End Date Taking? Authorizing Provider  hyoscyamine (LEVSIN/SL) 0.125 MG SL tablet Place 1 tablet (0.125 mg total) under the tongue every 4 (four) hours as needed. Do not take more than 1.5 mg per day. 05/30/15  Yes Nicole Pisciotta, PA-C  ibuprofen (ADVIL,MOTRIN) 200 MG tablet Take 400 mg by mouth every 6 (six) hours as needed for moderate pain.    Yes  Historical Provider, MD  silodosin (RAPAFLO) 8 MG CAPS capsule Take 8 mg by mouth once.   Yes Historical Provider, MD   BP 165/125 mmHg  Pulse 132  Temp(Src) 98.4 F (36.9 C) (Oral)  Resp 25  SpO2 100% Physical Exam  Constitutional: He appears well-developed and well-nourished. No distress.  HENT:  Head: Normocephalic and atraumatic.  Eyes: Conjunctivae are normal. Pupils are equal, round, and reactive to light.  Neck: Neck supple. No tracheal deviation present. No thyromegaly present.  Cardiovascular: Normal rate and regular rhythm.   No murmur heard. Pulmonary/Chest: Effort normal and breath sounds normal.  Abdominal: Soft. Bowel sounds are normal. He exhibits no distension. There is no tenderness.  Genitourinary: Penis normal.  Musculoskeletal: Normal range of motion. He exhibits no edema or tenderness.  Neurological: He is alert. Coordination normal.  Skin: Skin is warm and dry. No rash noted.  Psychiatric: He has a normal mood and affect.  Nursing note and vitals reviewed.   ED Course  Procedures (including critical care time) Labs Review Labs Reviewed  URINALYSIS, ROUTINE W REFLEX MICROSCOPIC (NOT AT [facility])    Imaging Review No results found. I have personally reviewed and evaluated these images and lab results as part of my medical decision-making.   EKG Interpretation None     I examined patient after Foley catheter inserted by nurse. Patient had immediate relief after catheter inserted and felt ready to go home. Results for orders placed or performed during the hospital encounter of 06/21/15  Urinalysis, Routine w reflex microscopic (not at [facility])  Result Value Ref Range   Color, Urine YELLOW YELLOW  APPearance CLOUDY (A) CLEAR   Specific Gravity, Urine 1.007 1.005 - 1.030   pH 6.0 5.0 - 8.0   Glucose, UA NEGATIVE NEGATIVE mg/dL   Hgb urine dipstick LARGE (A) NEGATIVE   Bilirubin Urine NEGATIVE NEGATIVE   Ketones, ur NEGATIVE NEGATIVE mg/dL   Protein, ur  NEGATIVE NEGATIVE mg/dL   Nitrite NEGATIVE NEGATIVE   Leukocytes, UA MODERATE (A) NEGATIVE  Urine microscopic-add on  Result Value Ref Range   Squamous Epithelial / LPF NONE SEEN NONE SEEN   WBC, UA TOO NUMEROUS TO COUNT 0 - 5 WBC/hpf   RBC / HPF 6-30 0 - 5 RBC/hpf   Bacteria, UA RARE (A) NONE SEEN   No results found.  MDM  He will go home with Foley catheter with leg bag. Urine sent for culture. He is asked to call Dr. Junious Silk tomorrow for follow-up. Blood pressure recheck in 3 weeks Diagnosis #1 urinary retention #2 elevated blood pressure Final diagnoses:  None        Orlie Dakin, MD 06/21/15 [id number]

## 2015-06-21 NOTE — Discharge Instructions (Signed)
Foley Catheter Care, Adult Call Dr. Junious Silk tomorrow to let him know that you needed to come here to get a catheter inserted. Get your blood pressure rechecked within the next 3 weeks. Today's was mildly elevated at 146/98. A Foley catheter is a soft, flexible tube. This tube is placed into your bladder to drain pee (urine). If you go home with this catheter in place, follow the instructions below. TAKING CARE OF THE CATHETER 1. Wash your hands with soap and water. 2. Put soap and water on a clean washcloth.  Clean the skin where the tube goes into your body.  Clean away from the tube site.  Never wipe toward the tube.  Clean the area using a circular motion.  Remove all the soap. Pat the area dry with a clean towel. For males, reposition the skin that covers the end of the penis (foreskin). 3. Attach the tube to your leg with tape or a leg strap. Do not stretch the tube tight. If you are using tape, remove any stickiness left behind by past tape you used. 4. Keep the drainage bag below your hips. Keep it off the floor. 5. Check your tube during the day. Make sure it is working and draining. Make sure the tube does not curl, twist, or bend. 6. Do not pull on the tube or try to take it out. TAKING CARE OF THE DRAINAGE BAGS You will have a large overnight drainage bag and a small leg bag. You may wear the overnight bag any time. Never wear the small bag at night. Follow the directions below. Emptying the Drainage Bag Empty your drainage bag when it is  - full or at least 2-3 times a day. 1. Wash your hands with soap and water. 2. Keep the drainage bag below your hips. 3. Hold the dirty bag over the toilet or clean container. 4. Open the pour spout at the bottom of the bag. Empty the pee into the toilet or container. Do not let the pour spout touch anything. 5. Clean the pour spout with a gauze pad or cotton ball that has rubbing alcohol on it. 6. Close the pour spout. 7. Attach the bag  to your leg with tape or a leg strap. 8. Wash your hands well. Changing the Drainage Bag Change your bag once a month or sooner if it starts to smell or look dirty.  1. Wash your hands with soap and water. 2. Pinch the rubber tube so that pee does not spill out. 3. Disconnect the catheter tube from the drainage tube at the connection valve. Do not let the tubes touch anything. 4. Clean the end of the catheter tube with an alcohol wipe. Clean the end of a the drainage tube with a different alcohol wipe. 5. Connect the catheter tube to the drainage tube of the clean drainage bag. 6. Attach the new bag to the leg with tape or a leg strap. Avoid attaching the new bag too tightly. 7. Wash your hands well. Cleaning the Drainage Bag 1. Wash your hands with soap and water. 2. Wash the bag in warm, soapy water. 3. Rinse the bag with warm water. 4. Fill the bag with a mixture of white vinegar and water (1 cup vinegar to 1 quart warm water [.2 liter vinegar to 1 liter warm water]). Close the bag and soak it for 30 minutes in the solution. 5. Rinse the bag with warm water. 6. Hang the bag to dry with the pour spout  open and hanging downward. 7. Store the clean bag (once it is dry) in a clean plastic bag. 8. Wash your hands well. PREVENT INFECTION  Wash your hands before and after touching your tube.  Take showers every day. Wash the skin where the tube enters your body. Do not take baths. Replace wet leg straps with dry ones, if this applies.  Do not use powders, sprays, or lotions on the genital area. Only use creams, lotions, or ointments as told by your doctor.  For females, wipe from front to back after going to the bathroom.  Drink enough fluids to keep your pee clear or pale yellow unless you are told not to have too much fluid (fluid restriction).  Do not let the drainage bag or tubing touch or lie on the floor.  Wear cotton underwear to keep the area dry. GET HELP IF:  Your pee is  cloudy or smells unusually bad.  Your tube becomes clogged.  You are not draining pee into the bag or your bladder feels full.  Your tube starts to leak. GET HELP RIGHT AWAY IF:  You have pain, puffiness (swelling), redness, or yellowish-white fluid (pus) where the tube enters the body.  You have pain in the belly (abdomen), legs, lower back, or bladder.  You have a fever.  You see blood fill the tube, or your pee is pink or red.  You feel sick to your stomach (nauseous), throw up (vomit), or have chills.  Your tube gets pulled out. MAKE SURE YOU:   Understand these instructions.  Will watch your condition.  Will get help right away if you are not doing well or get worse.   This information is not intended to replace advice given to you by your health care provider. Make sure you discuss any questions you have with your health care provider.   Document Released: 09/09/2012 Document Revised: 06/05/2014 Document Reviewed: 09/09/2012 Elsevier Interactive Patient Education [employer].

## 2015-06-24 LAB — URINE CULTURE
Culture: 30000
Special Requests: NORMAL

## 2015-06-25 ENCOUNTER — Telehealth (HOSPITAL_BASED_OUTPATIENT_CLINIC_OR_DEPARTMENT_OTHER): Payer: Self-pay | Admitting: Emergency Medicine

## 2015-06-25 NOTE — Progress Notes (Signed)
ED Antimicrobial Stewardship Positive Culture Follow Up   Douglas Barnes is an 60 y.o. male who presented to [facility] on 06/21/2015 with a chief complaint of  Chief Complaint  Patient presents with  . Urinary Retention    Recent Results (from the past 720 hour(s))  Urine culture     Status: None   Collection Time: 05/26/15  8:35 PM  Result Value Ref Range Status   Specimen Description URINE, CATHETERIZED  Final   Special Requests NONE  Final   Culture NO GROWTH 1 DAY  Final   Report Status 05/28/2015 FINAL  Final  Urine culture     Status: None   Collection Time: 05/30/15  5:20 PM  Result Value Ref Range Status   Specimen Description URINE, CATHETERIZED  Final   Special Requests NONE  Final   Culture NO GROWTH 2 DAYS  Final   Report Status 06/01/2015 FINAL  Final  Urine culture     Status: None   Collection Time: 06/21/15  7:14 PM  Result Value Ref Range Status   Specimen Description URINE, RANDOM  Final   Special Requests Normal  Final   Culture   Final    30,000 COLONIES/mL KLEBSIELLA PNEUMONIAE Performed at [facility]    Report Status 06/24/2015 FINAL  Final   Organism ID, Bacteria KLEBSIELLA PNEUMONIAE  Final      Susceptibility   Klebsiella pneumoniae - MIC*    AMPICILLIN 16 RESISTANT Resistant     CEFAZOLIN <=4 SENSITIVE Sensitive     CEFTRIAXONE <=1 SENSITIVE Sensitive     CIPROFLOXACIN <=0.25 SENSITIVE Sensitive     GENTAMICIN <=1 SENSITIVE Sensitive     IMIPENEM <=0.25 SENSITIVE Sensitive     NITROFURANTOIN <=16 SENSITIVE Sensitive     TRIMETH/SULFA <=20 SENSITIVE Sensitive     AMPICILLIN/SULBACTAM 4 SENSITIVE Sensitive     PIP/TAZO <=4 SENSITIVE Sensitive     * 30,000 COLONIES/mL KLEBSIELLA PNEUMONIAE    Patient with foley and complaints of urinary retention. No other signs and symptoms of UTI. Asymptomatic bacteruria, no treatment indicated.   ED Provider: Lenn Sink, PA-C  Melburn Popper, PharmD Clinical Pharmacy Resident Pager:  [phone] 06/25/2015 9:19 AM

## 2015-06-25 NOTE — Telephone Encounter (Signed)
Post ED Visit - Positive Culture Follow-up  Culture report reviewed by antimicrobial stewardship pharmacist:  []  Elenor Quinones, Pharm.D. []  Heide Guile, Pharm.D., BCPS []  Parks Neptune, Pharm.D. []  Alycia Rossetti, Pharm.D., BCPS []  [place], Pharm.D., BCPS, AAHIVP []  Legrand Como, Pharm.D., BCPS, AAHIVP []  Milus Glazier, Pharm.D. []  Stephens November, Pharm.D.  Positive urine culture Treated with none, asymptomatic, no further patient follow-up is required at this time.  Hazle Nordmann 06/25/2015, 9:44 AM

## 2015-06-28 ENCOUNTER — Other Ambulatory Visit: Payer: Self-pay | Admitting: Urology

## 2015-06-30 ENCOUNTER — Encounter (HOSPITAL_BASED_OUTPATIENT_CLINIC_OR_DEPARTMENT_OTHER): Payer: Self-pay | Admitting: *Deleted

## 2015-07-01 ENCOUNTER — Encounter (HOSPITAL_BASED_OUTPATIENT_CLINIC_OR_DEPARTMENT_OTHER): Payer: Self-pay | Admitting: *Deleted

## 2015-07-01 NOTE — Progress Notes (Signed)
NPO AFTER MN.  ARRIVE AT 0600.  NEEDS HG.  

## 2015-07-08 NOTE — H&P (Signed)
History of Present Illness              f/u - PCP Dr. Hall Busing    1- right renal cyst - back pain, MRI 2014 lumbar spine -- I reviewed all the images (pt brought disk) and the report states a 1.5 cm complex cystic lesion off the inferior pole of the right kidney. It appears the complex nature of the cyst was that it was more dense than a simple cyst as it may have contained some hemorrhagic or proteinaceous debris. It was recommended he undergo CT of the abdomen and pelvis with IV contrast in a multiphasic nature. The MRI did in fact show multilevel arthropathy.  -Nov 2014 - Renal ultrasound - right kidney is normal in appearance, RLP hypoechoic structure with increased through transmission of 2.4 x 1.58 x 1.58 cm consistent with a benign cyst. No solid mass stone or hydronephrosis was seen. The ureter was noted. The left kidney appeared normal without mass and hydronephrosis. The bladder area appeared normal. The prostate was enlarged. The ureters were noted near the bladder. The postvoid was 12 cc.    2- BPH with BOO - voids with a weak stream. He has frequency and urgency on occasion. Nocturia x 3-4. He has had no gross hematuria. He does have a history of BPH and is maintained on terazosin.  -Oct 2014 discontinued Terazosin, started tamsulosin  -Nov 2014 started tamsulosin. He has nocturia x 2-3. His stream has improved but continues to remain weak and very slow on some days. He has no dysuria or gross hematuria. He wants to stop the medicine.     3--PCa screening -- June 2014: bun 11, cr 0.74, PSA 2.7; pt declined DRE - reports Dr. Hall Busing performed it Apr 2014 and indeed it is marked on the exam sheet.   -Sep 2015 normal DRE      Jan 2017 int hx   Patient returns for continued management of BPH and lower urinary tract symptoms. He had a back procedure done about 6 months ago and developed urinary retention. He tried Rapaflo for a while. he also tried terazosin and tamsulosin in the  past. He got back on track with developed constipation and retention again last month. He's had a catheter since then Rapaflo and Hytrin are listed in the med sheet but patient does not think he still continued. He did have one episode of gross hematuria after the Foley was placed requiring bladder irrigation.   Past Medical History Problems  1. History of Anxiety (F41.9) 2. History of Arthritis 3. History of esophageal reflux (Z87.19) 4. History of hepatitis (Z86.19)  Surgical History Problems  1. History of No Surgical Problems  Current Meds 1. Ibuprofen TABS;  Therapy: (Recorded:23Jan2017) to Recorded 2. Levsin 0.125 MG Oral Tablet; TAKE 1 TABLET 4 times daily PRN Bladder spasms/pain;  Therapy: [id number] to (Last Rx:06Jan2017)  Requested for: [id number] Ordered 3. Multi-Day Vitamins TABS;  Therapy: (Recorded:20Jul2016) to Recorded  Allergies Medication  1. No Known Drug Allergies  Family History Problems  1. Family history of Cancer : Mother 2. Family history of Colon Cancer : Father 3. Family history of Death In The Family Father 4. Family history of Death In The Family Mother 45. Family history of Family Health Status Number Of Children   1 son 1 daughter  Social History Problems  1. Denied: History of Alcohol Use (History) 2. Caffeine Use   4 per day 3. Former smoker [phone])   smoked for 10yrs quit  1  1/2 yrs ago 4. Marital History - Currently Married 5. Occupation:   GM  Vitals Vital Signs [Data Includes: Last 1 Day]  Recorded: 23Jan2017 12:59PM  Blood Pressure: 136 / 92 Temperature: 97.8 F Heart Rate: 98  Physical Exam Constitutional: Well nourished and well developed . No acute distress.  Pulmonary: No respiratory distress and normal respiratory rhythm and effort.  Cardiovascular: Heart rate and rhythm are normal . No peripheral edema.  Neuro/Psych:. Mood and affect are appropriate.    Results/Data Urine [Data Includes: Last 1 Day]   [id number]   COLOR YELLOW   APPEARANCE CLOUDY   SPECIFIC GRAVITY <1.005   pH 6.0   GLUCOSE NEGATIVE   BILIRUBIN NEGATIVE   KETONE NEGATIVE   BLOOD 3+   PROTEIN NEGATIVE   NITRITE POSITIVE   LEUKOCYTE ESTERASE 2+   SQUAMOUS EPITHELIAL/HPF 0-5 HPF  WBC 10-20 WBC/HPF  RBC 3-10 RBC/HPF  BACTERIA MANY HPF  CRYSTALS NONE SEEN HPF  CASTS NONE SEEN LPF  Yeast NONE SEEN HPF   Procedure  Procedure: Cystoscopy   Indication: Hematuria. Lower Urinary Tract Symptoms.  Informed Consent: Risks, benefits, and potential adverse events were discussed and informed consent was obtained from the patient.  Prep: The patient was prepped with betadine.  Antibiotic prophylaxis: Trimethoprim/Sulfamethoxazole.  Procedure Note:  Urethral meatus:. No abnormalities.  Anterior urethra: No abnormalities.  Prostatic urethra: No abnormalities . The lateral prostatic lobes were enlarged.  Bladder: Visulization was clear. The ureteral orifices were in the normal anatomic position bilaterally and had clear efflux of urine. A systematic survey of the bladder demonstrated no bladder tumors or stones. The mucosa was smooth without abnormalities. The patient tolerated the procedure well.  Complications: None. filled 200 ml, voided about 100 ml. he was then prepped and draped and in and out catheterized and told how to perform.    Assessment Assessed  1. BPH (benign prostatic hypertrophy) with urinary retention (N40.1,R33.8) 2. Other retention of urine (R33.8) 3. Complex renal cyst (N28.1) 4. Gross hematuria (R31.0)  Plan Acute urinary retention  1. PVR U/S; Status:Canceled - Date of Service;  BPH (benign prostatic hypertrophy) with urinary retention  2. Follow-up Schedule Surgery Office  Follow-up  Status: Complete  Done: [id number] 3. Cath, simple, wIinsert Temp Cath; Status:Hold For - Appointment,Date of Service;  Requested [id number];  Gross hematuria  4. AU CT-HEMATURIA PROTOCOL; Status:Hold For -  Appointment,PreCert,Date of  Service,Print; Requested [id number];  Health Maintenance  5. UA With REFLEX; [Do Not Release]; Status:Complete;   [id number] 12:48PM Other retention of urine  6. Cysto; Status:Complete;   [id number]  Discussion/Summary          BPH - with urinary retention-we discussed again the nature risk and benefits of alpha-blocker's, 5 alpha reductase inhibitors and procedures such as TURP or greenlight. We discussed risks stricture, incontinence, need for staged procedure, bleeding among others. All questions answered. We'll set him up for greenlight photo vaporization. In the meantime he was taught CIC today. His prostate is very large we might consider TURP as his prostate was quite friable.    Urinary retention-he'll continue CIC at least twice a day. Also we restarted Rapaflo.    Kidney / renal cysts, gross hematuria - likely from catheter. We'll evaluate with CT.      cc; Dr. Lemmie Evens Electronically signed by : Festus Aloe, M.D.; Jun 21 2015  3:01PM EST   Prior Cx klebsiella  was started on NF.

## 2015-07-09 ENCOUNTER — Ambulatory Visit (HOSPITAL_BASED_OUTPATIENT_CLINIC_OR_DEPARTMENT_OTHER): Payer: BLUE CROSS/BLUE SHIELD | Admitting: Anesthesiology

## 2015-07-09 ENCOUNTER — Encounter (HOSPITAL_BASED_OUTPATIENT_CLINIC_OR_DEPARTMENT_OTHER): Payer: Self-pay | Admitting: *Deleted

## 2015-07-09 ENCOUNTER — Inpatient Hospital Stay (HOSPITAL_COMMUNITY): Payer: BLUE CROSS/BLUE SHIELD | Admitting: Anesthesiology

## 2015-07-09 ENCOUNTER — Encounter (HOSPITAL_COMMUNITY)
Admission: AD | Disposition: A | Discharge status: Home / Self Care | Payer: Self-pay | Source: Ambulatory Visit | Attending: Urology

## 2015-07-09 ENCOUNTER — Encounter (HOSPITAL_BASED_OUTPATIENT_CLINIC_OR_DEPARTMENT_OTHER)
Admission: RE | Disposition: A | Discharge status: Home / Self Care | Payer: Self-pay | Source: Ambulatory Visit | Attending: Urology

## 2015-07-09 ENCOUNTER — Other Ambulatory Visit: Payer: Self-pay | Admitting: Urology

## 2015-07-09 ENCOUNTER — Encounter (HOSPITAL_COMMUNITY): Payer: Self-pay | Admitting: *Deleted

## 2015-07-09 ENCOUNTER — Inpatient Hospital Stay (HOSPITAL_BASED_OUTPATIENT_CLINIC_OR_DEPARTMENT_OTHER)
Admission: AD | Admit: 2015-07-09 | Discharge: 2015-07-11 | DRG: 713 | Disposition: A | Discharge status: Home / Self Care | Payer: BLUE CROSS/BLUE SHIELD | Source: Ambulatory Visit | Attending: Urology | Admitting: Urology

## 2015-07-09 ENCOUNTER — Ambulatory Visit (HOSPITAL_BASED_OUTPATIENT_CLINIC_OR_DEPARTMENT_OTHER)
Admission: RE | Admit: 2015-07-09 | Discharge: 2015-07-09 | Disposition: A | Discharge status: Home / Self Care | Payer: BLUE CROSS/BLUE SHIELD | Source: Ambulatory Visit | Attending: Urology | Admitting: Urology

## 2015-07-09 DIAGNOSIS — N401 Enlarged prostate with lower urinary tract symptoms: Principal | ICD-10-CM

## 2015-07-09 DIAGNOSIS — I10 Essential (primary) hypertension: Secondary | ICD-10-CM | POA: Diagnosis present

## 2015-07-09 DIAGNOSIS — Z86010 Personal history of colonic polyps: Secondary | ICD-10-CM

## 2015-07-09 DIAGNOSIS — K219 Gastro-esophageal reflux disease without esophagitis: Secondary | ICD-10-CM | POA: Diagnosis present

## 2015-07-09 DIAGNOSIS — F419 Anxiety disorder, unspecified: Secondary | ICD-10-CM | POA: Diagnosis present

## 2015-07-09 DIAGNOSIS — R3912 Poor urinary stream: Secondary | ICD-10-CM | POA: Diagnosis present

## 2015-07-09 DIAGNOSIS — F17210 Nicotine dependence, cigarettes, uncomplicated: Secondary | ICD-10-CM | POA: Diagnosis present

## 2015-07-09 DIAGNOSIS — N281 Cyst of kidney, acquired: Secondary | ICD-10-CM | POA: Diagnosis present

## 2015-07-09 DIAGNOSIS — N320 Bladder-neck obstruction: Secondary | ICD-10-CM | POA: Diagnosis present

## 2015-07-09 DIAGNOSIS — M1990 Unspecified osteoarthritis, unspecified site: Secondary | ICD-10-CM | POA: Diagnosis present

## 2015-07-09 DIAGNOSIS — M549 Dorsalgia, unspecified: Secondary | ICD-10-CM | POA: Diagnosis present

## 2015-07-09 DIAGNOSIS — R319 Hematuria, unspecified: Secondary | ICD-10-CM | POA: Diagnosis present

## 2015-07-09 DIAGNOSIS — R338 Other retention of urine: Secondary | ICD-10-CM | POA: Diagnosis present

## 2015-07-09 DIAGNOSIS — R310 Gross hematuria: Secondary | ICD-10-CM

## 2015-07-09 DIAGNOSIS — N138 Other obstructive and reflux uropathy: Secondary | ICD-10-CM | POA: Diagnosis present

## 2015-07-09 DIAGNOSIS — Z800 Family history of malignant neoplasm of digestive organs: Secondary | ICD-10-CM

## 2015-07-09 DIAGNOSIS — N3289 Other specified disorders of bladder: Secondary | ICD-10-CM | POA: Diagnosis not present

## 2015-07-09 HISTORY — DX: Benign prostatic hyperplasia with lower urinary tract symptoms: N40.1

## 2015-07-09 HISTORY — DX: Other retention of urine: R33.8

## 2015-07-09 HISTORY — DX: Other specified abnormal immunological findings in serum: R76.8

## 2015-07-09 HISTORY — DX: Personal history of colon polyps, unspecified: Z86.0100

## 2015-07-09 HISTORY — DX: Presence of other specified devices: Z97.8

## 2015-07-09 HISTORY — DX: Adverse effect of unspecified anesthetic, initial encounter: T41.45XA

## 2015-07-09 HISTORY — DX: Personal history of colonic polyps: Z86.010

## 2015-07-09 HISTORY — DX: Presence of urogenital implants: Z96.0

## 2015-07-09 HISTORY — PX: TRANSURETHRAL RESECTION OF PROSTATE: SHX73

## 2015-07-09 HISTORY — PX: GREEN LIGHT LASER TURP (TRANSURETHRAL RESECTION OF PROSTATE: SHX6260

## 2015-07-09 HISTORY — DX: Other complications of anesthesia, initial encounter: T88.59XA

## 2015-07-09 HISTORY — DX: Personal history of other diseases of the circulatory system: Z86.79

## 2015-07-09 LAB — HEMOGLOBIN AND HEMATOCRIT, BLOOD
HCT: 32.5 % — ABNORMAL LOW (ref 39.0–52.0)
HEMOGLOBIN: 11.7 g/dL — AB (ref 13.0–17.0)

## 2015-07-09 LAB — POCT HEMOGLOBIN-HEMACUE: HEMOGLOBIN: 16 g/dL (ref 13.0–17.0)

## 2015-07-09 LAB — SURGICAL PCR SCREEN
MRSA, PCR: NEGATIVE
Staphylococcus aureus: NEGATIVE

## 2015-07-09 SURGERY — TRANSURETHRAL RESECTION OF THE PROSTATE WITH GYRUS INSTRUMENTS
Anesthesia: General | Site: Prostate

## 2015-07-09 SURGERY — GREEN LIGHT LASER TURP (TRANSURETHRAL RESECTION OF PROSTATE
Anesthesia: General

## 2015-07-09 MED ORDER — MIDAZOLAM HCL 2 MG/2ML IJ SOLN
INTRAMUSCULAR | Status: AC
  Filled 2015-07-09: qty 2

## 2015-07-09 MED ORDER — PROPOFOL 10 MG/ML IV BOLUS
INTRAVENOUS | Status: AC
  Filled 2015-07-09: qty 20

## 2015-07-09 MED ORDER — DEXAMETHASONE SODIUM PHOSPHATE 10 MG/ML IJ SOLN
INTRAMUSCULAR | Status: AC
  Filled 2015-07-09: qty 1

## 2015-07-09 MED ORDER — ALBUTEROL SULFATE HFA 108 (90 BASE) MCG/ACT IN AERS
INHALATION_SPRAY | RESPIRATORY_TRACT | Status: AC
  Filled 2015-07-09: qty 6.7

## 2015-07-09 MED ORDER — CEFAZOLIN SODIUM-DEXTROSE 2-3 GM-% IV SOLR
INTRAVENOUS | Status: AC
  Filled 2015-07-09: qty 50

## 2015-07-09 MED ORDER — SODIUM CHLORIDE 0.9 % IV SOLN
INTRAVENOUS | Status: DC
  Administered 2015-07-10: 17:00:00 via INTRAVENOUS

## 2015-07-09 MED ORDER — CEFAZOLIN SODIUM 1-5 GM-% IV SOLN
1.0000 g | INTRAVENOUS | Status: DC
  Filled 2015-07-09: qty 50

## 2015-07-09 MED ORDER — PHENYLEPHRINE HCL 10 MG/ML IJ SOLN
INTRAMUSCULAR | Status: DC | PRN
  Administered 2015-07-09 (×2): 80 ug via INTRAVENOUS

## 2015-07-09 MED ORDER — PROPOFOL 10 MG/ML IV BOLUS
INTRAVENOUS | Status: DC | PRN
  Administered 2015-07-09: 40 mg via INTRAVENOUS
  Administered 2015-07-09: 200 mg via INTRAVENOUS

## 2015-07-09 MED ORDER — LACTATED RINGERS IV SOLN
INTRAVENOUS | Status: DC
  Administered 2015-07-09 (×2): via INTRAVENOUS
  Filled 2015-07-09: qty 1000

## 2015-07-09 MED ORDER — SENNOSIDES-DOCUSATE SODIUM 8.6-50 MG PO TABS
1.0000 | ORAL_TABLET | Freq: Two times a day (BID) | ORAL | Status: DC
  Administered 2015-07-10 (×3): 1 via ORAL
  Filled 2015-07-09 (×3): qty 1

## 2015-07-09 MED ORDER — PROPOFOL 10 MG/ML IV BOLUS
INTRAVENOUS | Status: DC | PRN
Start: 2015-07-09 — End: 2015-07-09
  Administered 2015-07-09: 200 mg via INTRAVENOUS

## 2015-07-09 MED ORDER — FENTANYL CITRATE (PF) 100 MCG/2ML IJ SOLN
INTRAMUSCULAR | Status: AC
  Filled 2015-07-09: qty 2

## 2015-07-09 MED ORDER — PROMETHAZINE HCL 25 MG/ML IJ SOLN
6.2500 mg | INTRAMUSCULAR | Status: DC | PRN
  Filled 2015-07-09: qty 1

## 2015-07-09 MED ORDER — DEXAMETHASONE SODIUM PHOSPHATE 10 MG/ML IJ SOLN
INTRAMUSCULAR | Status: DC | PRN
  Administered 2015-07-09: 10 mg via INTRAVENOUS

## 2015-07-09 MED ORDER — HYDROMORPHONE HCL 1 MG/ML IJ SOLN
0.2500 mg | INTRAMUSCULAR | Status: DC | PRN
  Administered 2015-07-09 (×4): 0.5 mg via INTRAVENOUS

## 2015-07-09 MED ORDER — HYDROMORPHONE HCL 1 MG/ML IJ SOLN
INTRAMUSCULAR | Status: AC
  Filled 2015-07-09: qty 1

## 2015-07-09 MED ORDER — SODIUM CHLORIDE 0.9 % IR SOLN
Status: DC | PRN
  Administered 2015-07-09: 1000 mL

## 2015-07-09 MED ORDER — FENTANYL CITRATE (PF) 100 MCG/2ML IJ SOLN
INTRAMUSCULAR | Status: AC
Start: 2015-07-09 — End: 2015-07-09
  Filled 2015-07-09: qty 2

## 2015-07-09 MED ORDER — OXYBUTYNIN CHLORIDE 5 MG PO TABS
5.0000 mg | ORAL_TABLET | Freq: Three times a day (TID) | ORAL | Status: DC
  Administered 2015-07-09: 5 mg via ORAL
  Filled 2015-07-09: qty 1

## 2015-07-09 MED ORDER — OXYBUTYNIN CHLORIDE 5 MG PO TABS
ORAL_TABLET | ORAL | Status: AC
  Filled 2015-07-09: qty 1

## 2015-07-09 MED ORDER — FENTANYL CITRATE (PF) 100 MCG/2ML IJ SOLN
INTRAMUSCULAR | Status: DC | PRN
  Administered 2015-07-09 (×3): 50 ug via INTRAVENOUS
  Administered 2015-07-09: 100 ug via INTRAVENOUS
  Administered 2015-07-09: 50 ug via INTRAVENOUS

## 2015-07-09 MED ORDER — ALBUTEROL SULFATE HFA 108 (90 BASE) MCG/ACT IN AERS
INHALATION_SPRAY | RESPIRATORY_TRACT | Status: DC | PRN
  Administered 2015-07-09 (×2): 2 via RESPIRATORY_TRACT

## 2015-07-09 MED ORDER — SUGAMMADEX SODIUM 200 MG/2ML IV SOLN
INTRAVENOUS | Status: AC
  Filled 2015-07-09: qty 2

## 2015-07-09 MED ORDER — HYOSCYAMINE SULFATE 0.125 MG SL SUBL
0.1250 mg | SUBLINGUAL_TABLET | SUBLINGUAL | Status: DC | PRN
  Administered 2015-07-10 – 2015-07-11 (×4): 0.125 mg via SUBLINGUAL

## 2015-07-09 MED ORDER — MIDAZOLAM HCL 5 MG/5ML IJ SOLN
INTRAMUSCULAR | Status: DC | PRN
  Administered 2015-07-09: 2 mg via INTRAVENOUS

## 2015-07-09 MED ORDER — MEPERIDINE HCL 50 MG/ML IJ SOLN: 6.2500 mg | INTRAMUSCULAR | Status: DC | PRN

## 2015-07-09 MED ORDER — HYOSCYAMINE SULFATE 0.125 MG SL SUBL
0.1250 mg | SUBLINGUAL_TABLET | SUBLINGUAL | Status: DC | PRN
  Administered 2015-07-09: 0.125 mg via SUBLINGUAL
  Filled 2015-07-09: qty 1

## 2015-07-09 MED ORDER — ONDANSETRON HCL 4 MG/2ML IJ SOLN
INTRAMUSCULAR | Status: AC
  Filled 2015-07-09: qty 2

## 2015-07-09 MED ORDER — FENTANYL CITRATE (PF) 100 MCG/2ML IJ SOLN
INTRAMUSCULAR | Status: DC | PRN
  Administered 2015-07-09: 100 ug via INTRAVENOUS
  Administered 2015-07-09 (×2): 50 ug via INTRAVENOUS

## 2015-07-09 MED ORDER — DEXTROSE-NACL 5-0.45 % IV SOLN: INTRAVENOUS | Status: DC

## 2015-07-09 MED ORDER — SUCCINYLCHOLINE CHLORIDE 20 MG/ML IJ SOLN
INTRAMUSCULAR | Status: DC | PRN
  Administered 2015-07-09: 100 mg via INTRAVENOUS

## 2015-07-09 MED ORDER — PHENYLEPHRINE 40 MCG/ML (10ML) SYRINGE FOR IV PUSH (FOR BLOOD PRESSURE SUPPORT)
PREFILLED_SYRINGE | INTRAVENOUS | Status: AC
  Filled 2015-07-09: qty 10

## 2015-07-09 MED ORDER — LIDOCAINE HCL (CARDIAC) 20 MG/ML IV SOLN
INTRAVENOUS | Status: DC | PRN
  Administered 2015-07-09: 50 mg via INTRAVENOUS

## 2015-07-09 MED ORDER — BELLADONNA ALKALOIDS-OPIUM 16.2-60 MG RE SUPP
RECTAL | Status: DC | PRN
  Administered 2015-07-09: 1 via RECTAL

## 2015-07-09 MED ORDER — ACETAMINOPHEN 500 MG PO TABS
1000.0000 mg | ORAL_TABLET | Freq: Four times a day (QID) | ORAL | Status: AC
  Administered 2015-07-10 (×2): 1000 mg via ORAL
  Filled 2015-07-09 (×3): qty 2

## 2015-07-09 MED ORDER — ROCURONIUM BROMIDE 100 MG/10ML IV SOLN
INTRAVENOUS | Status: AC
  Filled 2015-07-09: qty 1

## 2015-07-09 MED ORDER — FENTANYL CITRATE (PF) 100 MCG/2ML IJ SOLN
25.0000 ug | INTRAMUSCULAR | Status: DC | PRN
  Administered 2015-07-09 (×2): 50 ug via INTRAVENOUS
  Filled 2015-07-09: qty 1

## 2015-07-09 MED ORDER — SODIUM CHLORIDE 0.9 % IR SOLN
Status: DC | PRN
Start: 2015-07-09 — End: 2015-07-09
  Administered 2015-07-09 (×3): 3000 mL via INTRAVESICAL
  Administered 2015-07-09: 1000 mL
  Administered 2015-07-09 (×2): 3000 mL via INTRAVESICAL

## 2015-07-09 MED ORDER — BELLADONNA ALKALOIDS-OPIUM 16.2-60 MG RE SUPP
RECTAL | Status: AC
  Filled 2015-07-09: qty 1

## 2015-07-09 MED ORDER — ROCURONIUM BROMIDE 100 MG/10ML IV SOLN
INTRAVENOUS | Status: DC | PRN
  Administered 2015-07-09: 30 mg via INTRAVENOUS

## 2015-07-09 MED ORDER — LIDOCAINE HCL 2 % EX GEL
CUTANEOUS | Status: AC
  Filled 2015-07-09: qty 5

## 2015-07-09 MED ORDER — ONDANSETRON HCL 4 MG/2ML IJ SOLN: 4.0000 mg | INTRAMUSCULAR | Status: DC | PRN

## 2015-07-09 MED ORDER — PROPOFOL 500 MG/50ML IV EMUL
INTRAVENOUS | Status: AC
  Filled 2015-07-09: qty 50

## 2015-07-09 MED ORDER — IBUPROFEN 200 MG PO TABS
ORAL_TABLET | ORAL | Status: AC
  Filled 2015-07-09: qty 2

## 2015-07-09 MED ORDER — HYDROMORPHONE HCL 1 MG/ML IJ SOLN
1.0000 mg | INTRAMUSCULAR | Status: DC | PRN
  Administered 2015-07-09 – 2015-07-11 (×9): 1 mg via INTRAVENOUS
  Filled 2015-07-09 (×9): qty 1

## 2015-07-09 MED ORDER — ONDANSETRON HCL 4 MG/2ML IJ SOLN
INTRAMUSCULAR | Status: DC | PRN
  Administered 2015-07-09: 4 mg via INTRAVENOUS

## 2015-07-09 MED ORDER — CEFAZOLIN SODIUM-DEXTROSE 2-3 GM-% IV SOLR
2.0000 g | INTRAVENOUS | Status: AC
  Administered 2015-07-09: 2 g via INTRAVENOUS
  Filled 2015-07-09: qty 50

## 2015-07-09 MED ORDER — STERILE WATER FOR IRRIGATION IR SOLN
Status: DC | PRN
  Administered 2015-07-09: 500 mL

## 2015-07-09 MED ORDER — SODIUM CHLORIDE 0.9 % IR SOLN
Status: DC | PRN
  Administered 2015-07-09: 18000 mL

## 2015-07-09 MED ORDER — LACTATED RINGERS IV SOLN
INTRAVENOUS | Status: DC | PRN
  Administered 2015-07-09: 19:00:00 via INTRAVENOUS

## 2015-07-09 MED ORDER — OXYCODONE HCL 5 MG PO TABS
5.0000 mg | ORAL_TABLET | ORAL | Status: DC | PRN
  Administered 2015-07-10 – 2015-07-11 (×4): 5 mg via ORAL
  Filled 2015-07-09 (×4): qty 1

## 2015-07-09 MED ORDER — DEXAMETHASONE SODIUM PHOSPHATE 4 MG/ML IJ SOLN
INTRAMUSCULAR | Status: DC | PRN
  Administered 2015-07-09: 10 mg via INTRAVENOUS

## 2015-07-09 MED ORDER — LIDOCAINE HCL (CARDIAC) 20 MG/ML IV SOLN
INTRAVENOUS | Status: AC
  Filled 2015-07-09: qty 20

## 2015-07-09 MED ORDER — PROPOFOL 10 MG/ML IV BOLUS
INTRAVENOUS | Status: AC
  Filled 2015-07-09: qty 40

## 2015-07-09 MED ORDER — HYOSCYAMINE SULFATE 0.125 MG SL SUBL
SUBLINGUAL_TABLET | SUBLINGUAL | Status: AC
  Filled 2015-07-09: qty 1

## 2015-07-09 MED ORDER — IBUPROFEN 400 MG PO TABS
400.0000 mg | ORAL_TABLET | Freq: Four times a day (QID) | ORAL | Status: DC | PRN
  Administered 2015-07-09: 400 mg via ORAL
  Filled 2015-07-09: qty 1

## 2015-07-09 MED ORDER — LIDOCAINE HCL (CARDIAC) 20 MG/ML IV SOLN
INTRAVENOUS | Status: AC
  Filled 2015-07-09: qty 5

## 2015-07-09 MED ORDER — SUGAMMADEX SODIUM 200 MG/2ML IV SOLN
INTRAVENOUS | Status: DC | PRN
  Administered 2015-07-09: 200 mg via INTRAVENOUS

## 2015-07-09 MED ORDER — LACTATED RINGERS IV SOLN: INTRAVENOUS | Status: DC

## 2015-07-09 MED ORDER — KETOROLAC TROMETHAMINE 30 MG/ML IJ SOLN
INTRAMUSCULAR | Status: AC
  Filled 2015-07-09: qty 1

## 2015-07-09 MED ORDER — ACETAMINOPHEN 10 MG/ML IV SOLN
INTRAVENOUS | Status: AC
  Filled 2015-07-09: qty 100

## 2015-07-09 MED ORDER — FENTANYL CITRATE (PF) 100 MCG/2ML IJ SOLN
INTRAMUSCULAR | Status: AC
  Filled 2015-07-09: qty 4

## 2015-07-09 MED ORDER — LIDOCAINE HCL (CARDIAC) 20 MG/ML IV SOLN
INTRAVENOUS | Status: DC | PRN
  Administered 2015-07-09: 100 mg via INTRAVENOUS

## 2015-07-09 MED ORDER — CIPROFLOXACIN IN D5W 400 MG/200ML IV SOLN
400.0000 mg | Freq: Once | INTRAVENOUS | Status: AC
  Administered 2015-07-09: 400 mg via INTRAVENOUS

## 2015-07-09 MED ORDER — CIPROFLOXACIN IN D5W 400 MG/200ML IV SOLN
INTRAVENOUS | Status: AC
  Filled 2015-07-09: qty 200

## 2015-07-09 MED ORDER — ONDANSETRON HCL 4 MG/2ML IJ SOLN: 4.0000 mg | Freq: Once | INTRAMUSCULAR | Status: DC | PRN

## 2015-07-09 MED ORDER — ACETAMINOPHEN 10 MG/ML IV SOLN
INTRAVENOUS | Status: DC | PRN
  Administered 2015-07-09: 1000 mg via INTRAVENOUS

## 2015-07-09 SURGICAL SUPPLY — 28 items
BAG DRAIN URO-CYSTO SKYTR STRL (DRAIN) ×3 IMPLANT
BAG URINE DRAINAGE (UROLOGICAL SUPPLIES) ×3 IMPLANT
CATH COUDE FOLEY 2W 5CC 18FR (CATHETERS) IMPLANT
CATH FOLEY 2WAY SLVR  5CC 18FR (CATHETERS)
CATH FOLEY 2WAY SLVR 18FR 30CC (CATHETERS) ×3 IMPLANT
CATH FOLEY 2WAY SLVR 5CC 18FR (CATHETERS) IMPLANT
CLOTH BEACON ORANGE TIMEOUT ST (SAFETY) ×3 IMPLANT
ELECT BIVAP BIPO 22/24 DONUT (ELECTROSURGICAL)
ELECT LOOP MED HF 24F 12D (CUTTING LOOP) IMPLANT
ELECTRD BIVAP BIPO 22/24 DONUT (ELECTROSURGICAL) IMPLANT
GLOVE BIO SURGEON STRL SZ7.5 (GLOVE) ×6 IMPLANT
GOWN STRL REUS W/ TWL XL LVL3 (GOWN DISPOSABLE) ×1 IMPLANT
GOWN STRL REUS W/TWL XL LVL3 (GOWN DISPOSABLE) ×2
HOLDER FOLEY CATH W/STRAP (MISCELLANEOUS) ×3 IMPLANT
IV NS 1000ML (IV SOLUTION) ×2
IV NS 1000ML BAXH (IV SOLUTION) ×1 IMPLANT
IV NS IRRIG 3000ML ARTHROMATIC (IV SOLUTION) ×15 IMPLANT
IV SET EXTENSION GRAVITY 40 LF (IV SETS) ×3 IMPLANT
KIT ROOM TURNOVER WOR (KITS) ×3 IMPLANT
LASER FIBER /GREENLIGHT LASER (Laser) ×3 IMPLANT
LASER GREENLIGHT RENTAL P/PROC (Laser) ×3 IMPLANT
LOOP CUT BIPOLAR 24F LRG (ELECTROSURGICAL) IMPLANT
MANIFOLD NEPTUNE II (INSTRUMENTS) ×3 IMPLANT
PACK CYSTO (CUSTOM PROCEDURE TRAY) ×3 IMPLANT
SYR 30ML LL (SYRINGE) IMPLANT
SYRINGE IRR TOOMEY STRL 70CC (SYRINGE) IMPLANT
TUBE CONNECTING 12'X1/4 (SUCTIONS) ×1
TUBE CONNECTING 12X1/4 (SUCTIONS) ×2 IMPLANT

## 2015-07-09 SURGICAL SUPPLY — 18 items
BAG URINE DRAINAGE (UROLOGICAL SUPPLIES) ×4 IMPLANT
BAG URO CATCHER STRL LF (MISCELLANEOUS) ×2 IMPLANT
CATH FOLEY 3WAY 30CC 22FR (CATHETERS) IMPLANT
CATH HEMA 3WAY 30CC 22FR COUDE (CATHETERS) ×2 IMPLANT
GLOVE BIO SURGEON STRL SZ7.5 (GLOVE) ×2 IMPLANT
GLOVE BIOGEL M STRL SZ7.5 (GLOVE) ×6 IMPLANT
GOWN STRL REUS W/TWL LRG LVL3 (GOWN DISPOSABLE) ×4 IMPLANT
HOLDER FOLEY CATH W/STRAP (MISCELLANEOUS) ×4 IMPLANT
IV NS IRRIG 3000ML ARTHROMATIC (IV SOLUTION) ×12 IMPLANT
KIT BASIN OR (CUSTOM PROCEDURE TRAY) ×2 IMPLANT
LOOP CUT BIPOLAR 24F LRG (ELECTROSURGICAL) ×2 IMPLANT
MANIFOLD NEPTUNE II (INSTRUMENTS) ×2 IMPLANT
NS IRRIG 1000ML POUR BTL (IV SOLUTION) ×2 IMPLANT
PACK CYSTO (CUSTOM PROCEDURE TRAY) ×2 IMPLANT
PLUG CATH AND CAP STER (CATHETERS) ×2 IMPLANT
SYR 30ML LL (SYRINGE) ×4 IMPLANT
SYRINGE IRR TOOMEY STRL 70CC (SYRINGE) ×2 IMPLANT
TUBING CONNECTING 10 (TUBING) ×2 IMPLANT

## 2015-07-09 NOTE — Anesthesia Procedure Notes (Addendum)
Procedure Name: LMA Insertion Date/Time: 07/09/2015 7:46 AM Performed by: Wanita Chamberlain Pre-anesthesia Checklist: Patient being monitored, Suction available, Emergency Drugs available, Patient identified and Timeout performed Patient Re-evaluated:Patient Re-evaluated prior to inductionOxygen Delivery Method: Circle system utilized Preoxygenation: Pre-oxygenation with 100% oxygen Intubation Type: IV induction Ventilation: Mask ventilation without difficulty LMA: LMA inserted LMA Size: 5.0 Number of attempts: 1 Airway Equipment and Method: Bite block Placement Confirmation: positive ETCO2 and breath sounds checked- equal and bilateral Tube secured with: Tape Dental Injury: Teeth and Oropharynx as per pre-operative assessment

## 2015-07-09 NOTE — Anesthesia Postprocedure Evaluation (Signed)
Anesthesia Post Note  Patient: Douglas Barnes  Procedure(s) Performed: Procedure(s) (LRB): GREEN LIGHT LASER TURP (TRANSURETHRAL RESECTION OF PROSTATE (N/A)  Patient location during evaluation: PACU Anesthesia Type: General Level of consciousness: awake and alert Pain management: pain level controlled Vital Signs Assessment: post-procedure vital signs reviewed and stable Respiratory status: spontaneous breathing, nonlabored ventilation, respiratory function stable and patient connected to nasal cannula oxygen Cardiovascular status: blood pressure returned to baseline and stable Postop Assessment: no signs of nausea or vomiting Anesthetic complications: no    Last Vitals:  Filed Vitals:   07/09/15 1000 07/09/15 1044  BP: 113/79 118/77  Pulse: 78 76  Temp:  36.8 C  Resp: 15 16    Last Pain:  Filed Vitals:   07/09/15 1053  PainSc: Kulm [name]

## 2015-07-09 NOTE — Transfer of Care (Signed)
Immediate Anesthesia Transfer of Care Note  Patient: Douglas Barnes  Procedure(s) Performed: Procedure(s): CYSTOSCOPY WITH CLOT EVACUATION //FULGERATION OF BLEEDERS (N/A)  Patient Location: PACU  Anesthesia Type:General  Level of Consciousness: awake, alert  and oriented  Airway & Oxygen Therapy: Patient Spontanous Breathing and Patient connected to face mask oxygen  Post-op Assessment: Report given to RN and Post -op Vital signs reviewed and stable  Post vital signs: Reviewed and stable  Last Vitals:  Filed Vitals:   07/09/15 1440  BP: 151/91  Pulse: 96  Temp: 36.4 C  Resp: 16    Complications: No apparent anesthesia complications

## 2015-07-09 NOTE — Anesthesia Procedure Notes (Signed)
Procedure Name: Intubation Date/Time: 07/09/2015 8:06 PM Performed by: Noralyn Pick D Pre-anesthesia Checklist: Patient identified, Emergency Drugs available, Suction available and Patient being monitored Patient Re-evaluated:Patient Re-evaluated prior to inductionOxygen Delivery Method: Circle System Utilized Preoxygenation: Pre-oxygenation with 100% oxygen Intubation Type: IV induction Ventilation: Mask ventilation without difficulty Laryngoscope Size: Mac and 4 Grade View: Grade III Tube type: Oral Tube size: 7.5 mm Number of attempts: 1 Airway Equipment and Method: Stylet and Oral airway Placement Confirmation: ETT inserted through vocal cords under direct vision,  positive ETCO2 and breath sounds checked- equal and bilateral Secured at: 22 cm Tube secured with: Tape Dental Injury: Teeth and Oropharynx as per pre-operative assessment

## 2015-07-09 NOTE — Progress Notes (Signed)
Resumed care of patient at this time. Report received from C. Sullivan, RN. Will continue to monitor pt closely and carry out plan of care. Asaro, Andruw Battie I   

## 2015-07-09 NOTE — Anesthesia Preprocedure Evaluation (Signed)
Anesthesia Evaluation  Patient identified by MRN, date of birth, ID band Patient awake    Reviewed: Allergy & Precautions, NPO status   Airway Mallampati: I  TM Distance: >3 FB Neck ROM: Full    Dental   Pulmonary Current Smoker,    Pulmonary exam normal        Cardiovascular Normal cardiovascular exam     Neuro/Psych    GI/Hepatic   Endo/Other    Renal/GU      Musculoskeletal   Abdominal   Peds  Hematology   Anesthesia Other Findings   Reproductive/Obstetrics                             Anesthesia Physical Anesthesia Plan  ASA: II and emergent  Anesthesia Plan: General   Post-op Pain Management:    Induction: Intravenous  Airway Management Planned: Oral ETT  Additional Equipment:   Intra-op Plan:   Post-operative Plan: Extubation in OR  Informed Consent: I have reviewed the patients History and Physical, chart, labs and discussed the procedure including the risks, benefits and alternatives for the proposed anesthesia with the patient or authorized representative who has indicated his/her understanding and acceptance.     Plan Discussed with: CRNA and Surgeon  Anesthesia Plan Comments:         Anesthesia Quick Evaluation

## 2015-07-09 NOTE — Op Note (Signed)
Preoperative diagnosis: BPH, urinary retention Postoperative diagnosis: Same  Procedure: Greenlight photo vaporization of the prostate  Surgeon: Junious Silk  Anesthesia: Gen.  Indication for procedure: 60 year old with BPH and urinary retention. He had been symptomatic for a few years. He was brought for surgical management. He failed a voiding trial. He was also having recurrent gross hematuria. Findings: On exam under anesthesia the penis was circumcised without mass or lesion. Testicles were centered bilaterally and palpably normal. On digital rectal exam the prostate was about 60 g and smooth without hard area or nodule.  On cystoscopy there was trilobar hypertrophy with a large median lobe and a left greater than right lateral lobe. The bladder was otherwise unremarkable. There were no stones or foreign bodies in the bladder.  Description of procedure: After consent was obtained patient brought to the operating room. After adequate anesthesia he was placed in lithotomy position and prepped and draped in the usual sterile fashion. A timeout was performed to confirm the patient and procedure. An exam under anesthesia was performed and I placed a B&O suppository. The laser scope was passed per urethra and the bladder inspected. I couldn't initially identify the ureteral orifices are the trigone. Made an incision at 5:00 and 7:00 to define the median lobe bronchus down to the bladder neck and down toward the veru. Looking through these incisions I was able to identify the right ureteral orifice and the trigone but was not ever able to locate the left ureteral orifice. However I did noted the region of the right UO and the trigone This in view periodically. Also these areas were normal after all vaporization. The left lateral lobe was much bigger and I believe the left ureteral orifice was hidden and more lateral. I then vaporized the median lobe keeping the laser pointed medially and working right and  then left and met down toward the veru. This created a good channel. I then vaporized the lateral lobes starting with the right from bladder neck to apex and then the left lateral lobe. The bladder was drained and there was good hemostasis and excellent channel under low pressure. There was some residual lateral lobe tissue on both sides which was vaporized. Again this created a good channel. The trigone and right ureteral orifice were again visualized noted to be normal. The trigone as far across as I could see it toward the left was normal. The scope was removed and an 18 French catheter was placed the balloon inflated to 21 mL and seated at the bladder neck. The irrigation was clear. He was awakened taken to recovery room in stable condition.  Complications: None  Blood loss: Minimal  Specimens: None  Drains: 18 French Foley catheter

## 2015-07-09 NOTE — H&P (Signed)
Douglas Barnes is an 60 y.o. male.    Chief Complaint: Clot retention after laser prostate surgery  HPI:   1 - Clot retention after laser prostate surgery - pt s/p greenlight PVP this AM for refractory BPH / outlet obstruction. Went home with foley but then developed bloody drainage with clots, then retention and severe SP pain. Presented to our office and found to be in frank retention where 10F hematuria cathere placed, 500cc clot removed and prepared for direct hospital admission.  No strong blood thinners. Had "half a biscuit" around 11:30 AM.   Past Medical History  Diagnosis Date  . BPH (benign prostatic hypertrophy) with urinary retention   . Foley catheter in place   . Complication of anesthesia     "fear of not waking up"  . History of colon polyps   . Hepatitis C antibody test positive     dx 1997--  pt is getting treatment in near future (last enzymes 12 /2016 stable per pt and in epic)  . History of hypertension     no medication since 2014 lost weight and decrease stress    Past Surgical History  Procedure Laterality Date  . Inguinal hernia repair Right age 20  . Colonoscopy with propofol  last one 2015    History reviewed. No pertinent family history. Social History:  reports that he has been smoking Cigarettes.  He has a 3.75 pack-year smoking history. He has never used smokeless tobacco. He reports that he does not drink alcohol. His drug history is not on file.  Allergies: No Known Allergies  Medications Prior to Admission  Medication Sig Dispense Refill  . hyoscyamine (LEVSIN/SL) 0.125 MG SL tablet Place 1 tablet (0.125 mg total) under the tongue every 4 (four) hours as needed. Do not take more than 1.5 mg per day. (Patient taking differently: Place 0.125 mg under the tongue every 4 (four) hours as needed for cramping. Do not take more than 1.5 mg per day.) 13 tablet 0  . ibuprofen (ADVIL,MOTRIN) 200 MG tablet Take 400 mg by mouth every 6 (six) hours as  needed for moderate pain.       Results for orders placed or performed during the hospital encounter of 07/09/15 (from the past 48 hour(s))  Hemoglobin-hemacue, POC     Status: None   Collection Time: 07/09/15  6:34 AM  Result Value Ref Range   Hemoglobin 16.0 13.0 - 17.0 g/dL   No results found.  Review of Systems  Constitutional: Negative.   HENT: Negative.   Eyes: Negative.   Cardiovascular: Negative.   Genitourinary: Positive for urgency and hematuria.       On / off clot retention.   Musculoskeletal: Negative.   Skin: Negative.   Neurological: Negative.   Endo/Heme/Allergies: Negative.   Psychiatric/Behavioral: Negative.     Blood pressure 151/91, pulse 96, temperature 97.6 F (36.4 C), temperature source Oral, resp. rate 16, height 5\' 8"  (1.727 m), weight 75.751 kg (167 lb), SpO2 96 %. Physical Exam  Constitutional: He appears well-developed.  HENT:  Head: Normocephalic.  Eyes: Pupils are equal, round, and reactive to light.  Neck: Normal range of motion.  Cardiovascular: Normal rate.   Respiratory: Effort normal.  GI: Soft.  Genitourinary:  Grossly bloody urine per foley.   Musculoskeletal: Normal range of motion.  Neurological: He is alert.  Skin: Skin is warm.  Psychiatric: He has a normal mood and affect. His behavior is normal. Judgment and thought content normal.  Assessment/Plan  1 - Clot retention after laser prostate surgery - admit, IVF, NPO, Pain control NS CBI, low threshold for take-back for operative cysto - clot evac and fulgeration should he not clear on CBI or if Hgb declien.   Alexis Frock, MD 07/09/2015, 4:49 PM

## 2015-07-09 NOTE — Op Note (Signed)
Douglas Barnes, Douglas Barnes NO.:  [id number]  MEDICAL RECORD NO.:  [id number]  LOCATION:  49                         FACILITY:  [facility]  PHYSICIAN:  Alexis Frock, MD     DATE OF BIRTH:  [birth year]  DATE OF PROCEDURE: 07/09/2015                               OPERATIVE REPORT  DIAGNOSIS:  Refractory clot, urinary retention.  PROCEDURE:  Cystoscopy, clot evacuation, and fulguration of bleeders.  ESTIMATED BLOOD LOSS:  Approximately 300 mL of old formed clot. Approximately 100 mL of new blood.  FINDINGS: 1. Large volume formed clot in the urinary bladder. 2. Oozing from the right apical prostatic fossa. 3. Very large volume prostatic fossa.  DRAINS:  A 22-French 3-way Foley catheter to normal saline irrigation, approximately 4 drops per second, efflux light pink.  30 mL sterile water in the balloon on light traction.  INDICATIONS:  Mr. Douglas Barnes is a 60 year old gentleman with history of obstructive voiding symptoms, who underwent GreenLight photovaporization of prostate earlier today and had uneventful immediate postoperative course.  He was discharged home, but unfortunately developed worsening gross hematuria with clots, eventually leading to clot urinary retention.  He presented to the office where he was found to be in frank clot retention.  Bedside irrigation and clot evacuation was performed; however, level of continued bleeding was impressive; therefore, he was directly admitted, was placed on continuous bladder irrigation.  He underwent a brief trial of this with catheter on traction, but remained with hematuria and residual clots and it was felt that proceeding with operative intervention, cysto clot evacuation would be warranted. Informed consent was obtained and placed in the medical record.  PROCEDURE IN DETAIL:  The patient being Douglas Barnes, was verified. Procedure being cystoscopy with clot evacuation was confirmed. Procedure was carried  out.  Time-out was performed.  Intravenous antibiotics were administered.  General anesthesia was introduced.  The patient was placed into a low lithotomy position and sterile field was created by prepping and draping the patient's penis, perineum, and proximal thighs using iodine x3 after removal of his in-situ catheter. Next, cystourethroscopy was performed using a 26-French resectoscope sheath with visual obturator.  Inspection of the anterior urethra was unremarkable.  Inspection of the posterior urethra revealed large volume prostatic fossa as expected.  There was large volume clot at the urinary bladder.  This was irrigated with a Toomey syringe, approximately 300 mL of old clot was evacuated.  Next, using loop electrode, very careful fulguration was performed in the entire prostatic fossa, which then resulted in excellent hemostasis.  There was more impressive blood from the right apical region.  Special attention was directed to fulguration of this area.  Following these maneuvers, hemostasis appeared excellent, the bladder was carefully inspected.  There was no evidence of perforation.  The resectoscope sheath was then exchanged for a new 22- [place] 3-way Foley catheter with 30 mL sterile water in the balloon. This was placed on catheter strap traction and normal saline irrigation, approximately 3- 4 drops per second.  The efflux was light pink and procedure was terminated.  The patient tolerated the procedure well.  There were no immediate periprocedural complications.  The patient was taken to the postanesthesia care unit  in stable condition for continued in-house observation, bladder irrigation and serial labs.          ______________________________ Alexis Frock, MD     TM/MEDQ  D:  07/09/2015  T:  07/09/2015  Job:  [id number]

## 2015-07-09 NOTE — Progress Notes (Signed)
Foley catheter clotted, unable to drain.  Catheter irrigated with 50cc NS and several large clots evacuated. Dr. Tresa Moore notified. Stacey Drain

## 2015-07-09 NOTE — Interval H&P Note (Signed)
History and Physical Interval Note:  07/09/2015 7:29 AM  Douglas Barnes  has presented today for surgery, with the diagnosis of BENIGN PROSTATIC HYPERTROPHY, RETENTION  The various methods of treatment have been discussed with the patient and family. After consideration of risks, benefits and other options for treatment, the patient has consented to  Procedure(s): GREEN LIGHT LASER TURP (TRANSURETHRAL RESECTION OF PROSTATE (N/A) as a surgical intervention .  The patient's history has been reviewed, patient examined, no change in status, stable for surgery. Discussed post-op catheter, bleeding among the rest. I have reviewed the patient's chart and labs.  Questions were answered to the patient's satisfaction.  He has been well. Urine clear. No fever. On abx.    Haywood Meinders

## 2015-07-09 NOTE — Anesthesia Postprocedure Evaluation (Signed)
Anesthesia Post Note  Patient: CLETUS DELOSANGELES  Procedure(s) Performed: Procedure(s) (LRB): CYSTOSCOPY WITH CLOT EVACUATION //FULGERATION OF BLEEDERS (N/A)  Patient location during evaluation: PACU Anesthesia Type: General Level of consciousness: awake and alert Pain management: pain level controlled Vital Signs Assessment: post-procedure vital signs reviewed and stable Respiratory status: spontaneous breathing, nonlabored ventilation, respiratory function stable and patient connected to nasal cannula oxygen Cardiovascular status: blood pressure returned to baseline and stable Postop Assessment: no signs of nausea or vomiting Anesthetic complications: no    Last Vitals:  Filed Vitals:   07/09/15 2205 07/09/15 2215  BP:  120/86  Pulse: 93 84  Temp:  36.3 C  Resp: 12 16    Last Pain:  Filed Vitals:   07/09/15 2233  PainSc: 6                  Trinady Milewski DAVID

## 2015-07-09 NOTE — Transfer of Care (Signed)
Immediate Anesthesia Transfer of Care Note  Patient: Douglas Barnes  Procedure(s) Performed: Procedure(s): GREEN LIGHT LASER TURP (TRANSURETHRAL RESECTION OF PROSTATE (N/A)  Patient Location: PACU  Anesthesia Type:General  Level of Consciousness: awake, alert , oriented and patient cooperative  Airway & Oxygen Therapy: Patient Spontanous Breathing and Patient connected to nasal cannula oxygen  Post-op Assessment: Report given to RN and Post -op Vital signs reviewed and stable  Post vital signs: Reviewed and stable  Last Vitals:  Filed Vitals:   07/09/15 0621  BP: 129/89  Pulse: 82  Temp: 37.1 C  Resp: 16    Complications: No apparent anesthesia complications

## 2015-07-09 NOTE — Progress Notes (Addendum)
   Pt s/p Greenlight PVP and developed bleeding at home. He clotted off catheter in office. A large 3 way was placed, catheter irrigated and pt admitted.   PE: In NAD, in good spirits Catheter has clotted off again.   I irrigated catheter, some small clots, but pt still bleeding. I started CBI.   A/P BPH with obstruction Urinary retention Gross hematuria  I believe he is going to need to go back to OR for cysto, clot evac, prostate fulguration/TURP to stop this bleeding. If he stops on CBI he will be at high risk to start again. I discussed the nature, risks, benefits and alternative with patient. Discussed with patient Dr. Tresa Moore would perform the procedure on call this evening. I discussed with Dr. Tresa Moore and appreciate his care of this patient.

## 2015-07-09 NOTE — Progress Notes (Signed)
Resumed care of patient. Agree with previous assessment. Will continue to monitor. 

## 2015-07-09 NOTE — Anesthesia Preprocedure Evaluation (Addendum)
Anesthesia Evaluation  Patient identified by MRN, date of birth, ID band Patient awake    Reviewed: Allergy & Precautions, NPO status , Patient's Chart, lab work & pertinent test results  History of Anesthesia Complications Negative for: history of anesthetic complications  Airway Mallampati: II  TM Distance: >3 FB Neck ROM: Full    Dental  (+) Dental Advisory Given, Partial Upper   Pulmonary Current Smoker,    Pulmonary exam normal breath sounds clear to auscultation       Cardiovascular Exercise Tolerance: Good negative cardio ROS Normal cardiovascular exam Rhythm:Regular Rate:Normal     Neuro/Psych Anxiety negative neurological ROS  negative psych ROS   GI/Hepatic negative GI ROS, (+) Hepatitis -, C  Endo/Other  negative endocrine ROS  Renal/GU negative Renal ROS   BPH with retention     Musculoskeletal negative musculoskeletal ROS (+)   Abdominal   Peds  Hematology negative hematology ROS (+)   Anesthesia Other Findings Day of surgery medications reviewed with the patient.  Pt with chronic back pain. Now getting steroid injections that control pain. 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  Reproductive/Obstetrics                           Anesthesia Physical Anesthesia Plan  ASA: II  Anesthesia Plan: General   Post-op Pain Management:    Induction: Intravenous  Airway Management Planned: LMA  Additional Equipment:   Intra-op Plan:   Post-operative Plan: Extubation in OR  Informed Consent: I have reviewed the patients History and Physical, chart, labs and discussed the procedure including the risks, benefits and alternatives for the proposed anesthesia with the patient or authorized representative who has indicated his/her understanding and acceptance.   Dental advisory given  Plan Discussed  with: CRNA  Anesthesia Plan Comments: (Risks/benefits of general anesthesia discussed with patient including risk of damage to teeth, lips, gum, and tongue, nausea/vomiting, allergic reactions to medications, and the possibility of heart attack, stroke and death.  All patient questions answered.  Patient wishes to proceed.)        Anesthesia Quick Evaluation

## 2015-07-09 NOTE — Discharge Instructions (Signed)
Foley Catheter Care, Adult A Foley catheter is a soft, flexible tube that is placed into the bladder to drain urine. A Foley catheter may be inserted if:  You leak urine or are not able to control when you urinate (urinary incontinence).  You are not able to urinate when you need to (urinary retention).  You had prostate surgery or surgery on the genitals.  You have certain medical conditions, such as multiple sclerosis, dementia, or a spinal cord injury. If you are going home with a Foley catheter in place, follow the instructions below. TAKING CARE OF THE CATHETER  Wash your hands with soap and water.  Using mild soap and warm water on a clean washcloth:  Clean the area on your body closest to the catheter insertion site using a circular motion, moving away from the catheter. Never wipe toward the catheter because this could sweep bacteria up into the urethra and cause infection.  Remove all traces of soap. Pat the area dry with a clean towel. For males, reposition the foreskin.  Attach the catheter to your leg so there is no tension on the catheter. Use adhesive tape or a leg strap. If you are using adhesive tape, remove any sticky residue left behind by the previous tape you used.  Keep the drainage bag below the level of the bladder, but keep it off the floor.  Check throughout the day to be sure the catheter is working and urine is draining freely. Make sure the tubing does not become kinked.  Do not pull on the catheter or try to remove it. Pulling could damage internal tissues. TAKING CARE OF THE DRAINAGE BAGS You will be given two drainage bags to take home. One is a large overnight drainage bag, and the other is a smaller leg bag that fits underneath clothing. You may wear the overnight bag at any time, but you should never wear the smaller leg bag at night. Follow the instructions below for how to empty, change, and clean your drainage bags. Emptying the Drainage Bag You  must empty your drainage bag when it is  - full or at least 2-3 times a day.  Wash your hands with soap and water.  Keep the drainage bag below your hips, below the level of your bladder. This stops urine from going back into the tubing and into your bladder.  Hold the dirty bag over the toilet or a clean container.  Open the pour spout at the bottom of the bag and empty the urine into the toilet or container. Do not let the pour spout touch the toilet, container, or any other surface. Doing so can place bacteria on the bag, which can cause an infection.  Clean the pour spout with a gauze pad or cotton ball that has rubbing alcohol on it.  Close the pour spout.  Attach the bag to your leg with adhesive tape or a leg strap.  Wash your hands well. Changing the Drainage Bag Change your drainage bag once a month or sooner if it starts to smell bad or look dirty. Below are steps to follow when changing the drainage bag.  Wash your hands with soap and water.  Pinch off the rubber catheter so that urine does not spill out.  Disconnect the catheter tube from the drainage tube at the connection valve. Do not let the tubes touch any surface.  Clean the end of the catheter tube with an alcohol wipe. Use a different alcohol wipe to clean  the end of the drainage tube.  Connect the catheter tube to the drainage tube of the clean drainage bag.  Attach the new bag to the leg with adhesive tape or a leg strap. Avoid attaching the new bag too tightly.  Wash your hands well. Cleaning the Drainage Bag 1. Wash your hands with soap and water. 2. Wash the bag in warm, soapy water. 3. Rinse the bag thoroughly with warm water. 4. Fill the bag with a solution of white vinegar and water (1 cup vinegar to 1 qt warm water [.2 L vinegar to 1 L warm water]). Close the bag and soak it for 30 minutes in the solution. 5. Rinse the bag with warm water. 6. Hang the bag to dry with the pour spout open and hanging  downward. 7. Store the clean bag (once it is dry) in a clean plastic bag. 8. Wash your hands well. PREVENTING INFECTION  Wash your hands before and after handling your catheter.  Take showers daily and wash the area where the catheter enters your body. Do not take baths. Replace wet leg straps with dry ones, if this applies.  Do not use powders, sprays, or lotions on the genital area. Only use creams, lotions, or ointments as directed by your caregiver.  For females, wipe from front to back after each bowel movement.  Drink enough fluids to keep your urine clear or pale yellow unless you have a fluid restriction.  Do not let the drainage bag or tubing touch or lie on the floor.  Wear cotton underwear to absorb moisture and to keep your skin drier. SEEK MEDICAL CARE IF:   Your urine is cloudy or smells unusually bad.  Your catheter becomes clogged.  You are not draining urine into the bag or your bladder feels full.  Your catheter starts to leak. SEEK IMMEDIATE MEDICAL CARE IF:   You have pain, swelling, redness, or pus where the catheter enters the body.  You have pain in the abdomen, legs, lower back, or bladder.  You have a fever.  You see blood fill the catheter, or your urine is pink or red.  You have nausea, vomiting, or chills.  Your catheter gets pulled out. MAKE SURE YOU:   Understand these instructions.  Will watch your condition.  Will get help right away if you are not doing well or get worse.   This information is not intended to replace advice given to you by your health care provider. Make sure you discuss any questions you have with your health care provider.   Document Released: 05/15/2005 Document Revised: 09/29/2013 Document Reviewed: 05/06/2012 Elsevier Interactive Patient Education 2016 [place] Light Laser Prostate Treatment Green light laser therapy is a procedure that uses a special high-energy laser for vaporizing extra  prostate tissue. It is less invasive than traditional methods of prostate surgery, which involve cutting out the prostate tissue. Because the tissue is vaporized rather than cut out there is generally less blood loss. LET [facility] CARE PROVIDER KNOW ABOUT:  Any allergies you have.  Any medicines you are taking, including vitamins, herbs, eye drops, creams, and over-the-counter medication.  Previous problems you or members of your family have had with the use of anesthetics.  Any blood disorders you have.  Previous surgeries you have had.  Medical conditions you have. RISKS AND COMPLICATIONS Generally, green light laser prostate treatment is a safe procedure. However, as with any procedure, complications can occur. Possible complications include:  Urinary tract infection.  Erectile dysfunction (rare).  Dry ejaculation--Semen is not released when you reach sexual climax.  Scar tissue in the urinary passage. BEFORE THE PROCEDURE   Your health care provider may discuss medicines you are taking and may advise you to stop taking specific ones.  You may be given antibiotic medicine to take as a precaution against bacterial infection.  Do not eat or drink anything for 8 hours before your procedure or as directed by your health care provider. You may have a sip of water to take any necessary medicines. PROCEDURE Depending on the size and shape of your prostate, the procedure may take 30-60 minutes. You will be given one of the following:   A medicine that makes you go to sleep (general anesthetic).  A medicine injected into your spine that numbs your body below the waist (spinal anesthetic). Sedation is usually given with spinal anesthetic so you will be relaxed. A tube containing viewing scopes and instruments will be inserted through your penis so that no cuts (incisions) are needed. A thin fiber is put through the tube and positioned next to the excess prostate tissue. Pulses of  laser light come from the end of the fiber and are projected onto the excess tissue. The laser beam is absorbed by your blood, which becomes hot enough to vaporize the excess prostate tissue. This laser beam will seal off the blood vessels, decreasing bleeding. The tube with the viewing scopes, instruments, and thin fiber will be removed and replaced with a temporary catheter. AFTER THE PROCEDURE  After the surgery, you will be sent to the recovery room for a short time. Depending on factors such as the amount of prostate tissue vaporized, the strength of your bladder, and the amount of bleeding expected, the catheter may be removed. Generally, overnight stay is not needed and you will be sent home on the same day as the procedure. You may be sent home with elastic support stockings to help prevent blood clots in your legs.    This information is not intended to replace advice given to you by your health care provider. Make sure you discuss any questions you have with your health care provider.   Document Released: 08/22/2007 Document Revised: 05/20/2013 Document Reviewed: 11/04/2012 Elsevier Interactive Patient Education 2016 [place] [facility] Care Instructions  Activity: Get plenty of rest for the remainder of the day. A responsible adult should stay with you for 24 hours following the procedure.  For the next 24 hours, DO NOT: -Drive a car -Paediatric nurse -Drink alcoholic beverages -Take any medication unless instructed by your physician -Make any legal decisions or sign important papers.  Meals: Start with liquid foods such as gelatin or soup. Progress to regular foods as tolerated. Avoid greasy, spicy, heavy foods. If nausea and/or vomiting occur, drink only clear liquids until the nausea and/or vomiting subsides. Call your physician if vomiting continues.  Special Instructions/Symptoms: Your throat may feel dry or sore from the anesthesia or the breathing  tube placed in your throat during surgery. If this causes discomfort, gargle with warm salt water. The discomfort should disappear within 24 hours.  If you had a scopolamine patch placed behind your ear for the management of post- operative nausea and/or vomiting:  1. The medication in the patch is effective for 72 hours, after which it should be removed.  Wrap patch in a tissue and discard in the trash. Wash hands thoroughly with soap and  include dry mouth, dizziness or visual disturbances. 3. Avoid touching the patch. Wash your hands with soap and water after contact with the patch.      

## 2015-07-09 NOTE — Brief Op Note (Signed)
07/09/2015  9:02 PM  PATIENT:  Douglas Barnes  60 y.o. male  PRE-OPERATIVE DIAGNOSIS:  refractory gross hematuria  POST-OPERATIVE DIAGNOSIS:  gross hematuria Benign prostatic hyperplasia with obstruction  PROCEDURE:  Procedure(s): CYSTOSCOPY WITH CLOT EVACUATION //FULGERATION OF BLEEDERS (N/A)  SURGEON:  Surgeon(s) and Role:    * Alexis Frock, MD - Primary  PHYSICIAN ASSISTANT:   ASSISTANTS: Verdis Frederickson MD   ANESTHESIA:   general  EBL:     BLOOD ADMINISTERED:none  DRAINS: 55F 3 way foley to NS irrigation   LOCAL MEDICATIONS USED:  NONE  SPECIMEN:  No Specimen  DISPOSITION OF SPECIMEN:  N/A  COUNTS:  YES  TOURNIQUET:  * No tourniquets in log *  DICTATION: .Other Dictation: Dictation Number [id number]  PLAN OF CARE: Admit for overnight observation  PATIENT DISPOSITION:  PACU - hemodynamically stable.   Delay start of Pharmacological VTE agent (>24hrs) due to surgical blood loss or risk of bleeding: yes

## 2015-07-10 DIAGNOSIS — R338 Other retention of urine: Secondary | ICD-10-CM | POA: Diagnosis present

## 2015-07-10 DIAGNOSIS — N401 Enlarged prostate with lower urinary tract symptoms: Secondary | ICD-10-CM | POA: Diagnosis present

## 2015-07-10 DIAGNOSIS — M549 Dorsalgia, unspecified: Secondary | ICD-10-CM | POA: Diagnosis present

## 2015-07-10 DIAGNOSIS — K219 Gastro-esophageal reflux disease without esophagitis: Secondary | ICD-10-CM | POA: Diagnosis present

## 2015-07-10 DIAGNOSIS — F17210 Nicotine dependence, cigarettes, uncomplicated: Secondary | ICD-10-CM | POA: Diagnosis present

## 2015-07-10 DIAGNOSIS — R339 Retention of urine, unspecified: Secondary | ICD-10-CM | POA: Diagnosis present

## 2015-07-10 DIAGNOSIS — R310 Gross hematuria: Secondary | ICD-10-CM | POA: Diagnosis present

## 2015-07-10 DIAGNOSIS — F419 Anxiety disorder, unspecified: Secondary | ICD-10-CM | POA: Diagnosis present

## 2015-07-10 DIAGNOSIS — N138 Other obstructive and reflux uropathy: Secondary | ICD-10-CM | POA: Diagnosis present

## 2015-07-10 DIAGNOSIS — N3289 Other specified disorders of bladder: Secondary | ICD-10-CM | POA: Diagnosis not present

## 2015-07-10 DIAGNOSIS — R3912 Poor urinary stream: Secondary | ICD-10-CM | POA: Diagnosis present

## 2015-07-10 DIAGNOSIS — M1990 Unspecified osteoarthritis, unspecified site: Secondary | ICD-10-CM | POA: Diagnosis present

## 2015-07-10 DIAGNOSIS — N320 Bladder-neck obstruction: Secondary | ICD-10-CM | POA: Diagnosis present

## 2015-07-10 DIAGNOSIS — I10 Essential (primary) hypertension: Secondary | ICD-10-CM | POA: Diagnosis present

## 2015-07-10 DIAGNOSIS — N281 Cyst of kidney, acquired: Secondary | ICD-10-CM | POA: Diagnosis present

## 2015-07-10 DIAGNOSIS — Z800 Family history of malignant neoplasm of digestive organs: Secondary | ICD-10-CM | POA: Diagnosis not present

## 2015-07-10 DIAGNOSIS — Z86010 Personal history of colonic polyps: Secondary | ICD-10-CM | POA: Diagnosis not present

## 2015-07-10 LAB — BASIC METABOLIC PANEL
ANION GAP: 8 (ref 5–15)
BUN: 17 mg/dL (ref 6–20)
CALCIUM: 8.1 mg/dL — AB (ref 8.9–10.3)
CO2: 24 mmol/L (ref 22–32)
CREATININE: 0.89 mg/dL (ref 0.61–1.24)
Chloride: 105 mmol/L (ref 101–111)
GLUCOSE: 182 mg/dL — AB (ref 65–99)
Potassium: 3.9 mmol/L (ref 3.5–5.1)
Sodium: 137 mmol/L (ref 135–145)

## 2015-07-10 LAB — HEMOGLOBIN AND HEMATOCRIT, BLOOD
HEMATOCRIT: 30.7 % — AB (ref 39.0–52.0)
Hemoglobin: 10.8 g/dL — ABNORMAL LOW (ref 13.0–17.0)

## 2015-07-10 NOTE — Progress Notes (Signed)
Spoke with MD on Call, Toya Smothers regarding giving pt 1,000 mg of Tylenol with Hepatitis C. Per MD it is okay to give scheduled doses of Tylenol for this pt. Will continue to monitor closely. Carnella [place] I

## 2015-07-10 NOTE — Progress Notes (Signed)
1 Day Post-Op Subjective: Douglas Barnes, foley to traction with minimal CBI with urine pink to clear. Hemodynamically stabel. C/o some minimal bladder spasms, improved with hyoscyamine.    Objective: Vital signs in last 24 hours: Temp:  [97.4 F (36.3 C)-98.4 F (36.9 C)] 98.4 F (36.9 C) (02/11 0520) Pulse Rate:  [76-107] 88 (02/11 0520) Resp:  [11-16] 16 (02/11 0520) BP: (109-152)/(63-97) 152/64 mmHg (02/11 0520) SpO2:  [96 %-100 %] 98 % (02/11 0520) Weight:  [75.751 kg (167 lb)] 75.751 kg (167 lb) (02/10 1440)  Intake/Output from previous day: 02/10 0701 - 02/11 0700 In: 2600 [I.V.:800] Out: 7240 [Urine:7240] Intake/Output this shift:    Physical Exam:  General: Alert and oriented CV: RRR Lungs: Clear Abdomen: Soft, ND Foley: Hematuria catheter in place, off traction, urine pink to clear without clot Ext: NT, No erythema  Lab Results:  Recent Labs  07/09/15 0634 07/09/15 2154 07/10/15 0515  HGB 16.0 11.7* 10.8*  HCT  --  32.5* 30.7*   BMET  Recent Labs  07/10/15 0515  NA 137  K 3.9  CL 105  CO2 24  GLUCOSE 182*  BUN 17  CREATININE 0.89  CALCIUM 8.1*     Studies/Results: No results found.  Assessment/Plan: POD 1 s/p Greenlight TURP 0000000 complicated by post-operative clot urinary retention and need for cystoscopy clot evacuation and fulguration, now improving with clear urine on minimal CBI.  -Continue foley catheter, take off traction, CBI off, may restart to keep urine pink to clear as needed, may d/c CBI this afternoon if does well all day -Regular diet -Saline lock -Bowel regimen -ICS, OOB with assistance   LOS: 1 day   Star Age 07/10/2015, 7:34 AM    I have seen and examined the patient and agree with assesment and plan.  S: POD 1 s/p PVP, then clot evac / fulgeration.   O: NAD with family at bedside Non-labored breathing Foley c/d/i with clear yellow urine on slow CBI  A/P 1 - stop bladder irrigation, but keep foley. 2 -  remain in house, likely DC AM tomorrow as long as no more clots / retention off CBI x 24 hrs.

## 2015-07-11 DIAGNOSIS — R319 Hematuria, unspecified: Secondary | ICD-10-CM | POA: Diagnosis present

## 2015-07-11 LAB — BASIC METABOLIC PANEL
ANION GAP: 6 (ref 5–15)
BUN: 15 mg/dL (ref 6–20)
CALCIUM: 8.3 mg/dL — AB (ref 8.9–10.3)
CHLORIDE: 108 mmol/L (ref 101–111)
CO2: 28 mmol/L (ref 22–32)
CREATININE: 0.83 mg/dL (ref 0.61–1.24)
GFR calc non Af Amer: >60 mL/min (ref >60)
GLUCOSE: 117 mg/dL — AB (ref 65–99)
Potassium: 3.6 mmol/L (ref 3.5–5.1)
Sodium: 142 mmol/L (ref 135–145)

## 2015-07-11 LAB — HEMOGLOBIN AND HEMATOCRIT, BLOOD
HCT: 27.3 % — ABNORMAL LOW (ref 39.0–52.0)
Hemoglobin: 9.8 g/dL — ABNORMAL LOW (ref 13.0–17.0)

## 2015-07-11 MED ORDER — BELLADONNA ALKALOIDS-OPIUM 16.2-60 MG RE SUPP
1.0000 | Freq: Four times a day (QID) | RECTAL | Status: DC | PRN
  Administered 2015-07-11: 1 via RECTAL
  Filled 2015-07-11: qty 1

## 2015-07-11 NOTE — Progress Notes (Signed)
Patient and spouse given discharge, medication, and follow up instructions, verbalized understanding, personal medications returned from pharmacy, IV removed, family to transport home.

## 2015-07-11 NOTE — Discharge Summary (Signed)
Physician Discharge Summary  Patient ID: Douglas Barnes MRN: [id number] DOB/AGE: July 19, 1955 60 y.o.  Admit date: 07/09/2015 Discharge date: 07/11/2015  Admission Diagnoses: gross hematuria refractory gross hematuria  Discharge Diagnoses:  Active Problems:   BPH with obstruction/lower urinary tract symptoms   Hematuria, gross  POA Past Medical History  Diagnosis Date  . BPH (benign prostatic hypertrophy) with urinary retention   . Foley catheter in place   . Complication of anesthesia     "fear of not waking up"  . History of colon polyps   . Hepatitis C antibody test positive     dx 1997--  pt is getting treatment in near future (last enzymes 12 /2016 stable per pt and in epic)  . History of hypertension     no medication since 2014 lost weight and decrease stress    Discharged Condition: good  Procedures: 07/09/15 Cystourethroscopy with clot evacuation and fulguration of bleeding tissue  Hospital Course:   Patient underwent KTP greenlight laser transurethral resection of the prostate on 07/09/2015 he returned several hours postoperatively due to clot urinary retention, several clots were manually irrigated and it was determined that he needed to return to the operating room for clot evacuation and fulguration. This performed on the evening of 07/09/2015. He was noted to have a very large prostate with significant remaining tissue which was fulgurated and roughly 500 mL of clot was evacuated. Hematuria catheter was placed and he was put on continuous bladder irrigation. Urine immediately following the procedure was cherry red with no clots on moderate drip CBI. On postoperative day 1 on minimal CBI had clear pink urine. CBI was stopped. Pain was controlled, he was ambulatory. He was deemed stable for discharge with Foley catheter in place on postoperative day 2. He'll follow up in clinic next week with Dr. Junious Silk on Wednesday for trial of void. Foley catheter teaching and leg bag  were provided by the nursing staff.   Significant Diagnostic Studies: No results found.  Discharge Exam: Blood pressure 120/72, pulse 85, temperature 98.7 F (37.1 C), temperature source Oral, resp. rate 16, height 5\' 8"  (1.727 m), weight 75.751 kg (167 lb), SpO2 98 %.   Physical Exam:  Vital signs in last 24 hours: Temp:  [97.6 F (36.4 C)-98.7 F (37.1 C)] 98.7 F (37.1 C) (02/12 0530) Pulse Rate:  [81-86] 85 (02/12 0530) Resp:  [16] 16 (02/12 0530) BP: (115-135)/(58-75) 120/72 mmHg (02/12 0530) SpO2:  [98 %-99 %] 98 % (02/12 0530) Constitutional:  Alert and oriented, No acute distress Cardiovascular: Regular rate and rhythm, No JVD Respiratory: Normal respiratory effort, SORA GI: Abdomen is soft, nontender, nondistended, no abdominal masses Genitourinary: No CVAT. Foley catheter in place draining clear urine. Rectal: deferred Neurologic: Grossly intact, no focal deficits Psychiatric: Normal mood and affect   Disposition: 01-Home or Self Care     Medication List    ASK your doctor about these medications        hyoscyamine 0.125 MG SL tablet  Commonly known as:  LEVSIN/SL  Place 1 tablet (0.125 mg total) under the tongue every 4 (four) hours as needed. Do not take more than 1.5 mg per day.     ibuprofen 200 MG tablet  Commonly known as:  ADVIL,MOTRIN  Take 400 mg by mouth every 6 (six) hours as needed for moderate pain.        Signed: Star Age 07/11/2015, 7:41 AM   I have seen / examined the patient and agree as per above.  Briefly.  S: s/p cysto clot-evacuation from re-bleed after prostate lase surgery. No recurrent gross hematuria / catheter clotting x 24 hrs off irrigation.  O: NAD, Wife at bedside RRR Non-labored breathing Foley c/d/i with medium yellow urine, no clots off irrigation NO c/c/e  A/P: DC home with foley, will keep appt as scheduled next week for trial of void.

## 2015-07-12 ENCOUNTER — Encounter (HOSPITAL_BASED_OUTPATIENT_CLINIC_OR_DEPARTMENT_OTHER): Payer: Self-pay | Admitting: Urology

## 2015-07-13 ENCOUNTER — Encounter (HOSPITAL_COMMUNITY): Payer: Self-pay | Admitting: Urology

## 2015-08-30 DIAGNOSIS — A630 Anogenital (venereal) warts: Secondary | ICD-10-CM | POA: Diagnosis not present

## 2015-09-27 ENCOUNTER — Other Ambulatory Visit (HOSPITAL_COMMUNITY): Payer: Self-pay | Admitting: Gastroenterology

## 2015-09-27 DIAGNOSIS — B1920 Unspecified viral hepatitis C without hepatic coma: Secondary | ICD-10-CM

## 2015-09-28 DIAGNOSIS — B1920 Unspecified viral hepatitis C without hepatic coma: Secondary | ICD-10-CM | POA: Diagnosis not present

## 2015-10-13 ENCOUNTER — Ambulatory Visit (HOSPITAL_COMMUNITY)
Admission: RE | Admit: 2015-10-13 | Discharge: 2015-10-13 | Disposition: A | Discharge status: Home / Self Care | Payer: BLUE CROSS/BLUE SHIELD | Source: Ambulatory Visit | Attending: Gastroenterology | Admitting: Gastroenterology

## 2015-10-13 DIAGNOSIS — B1920 Unspecified viral hepatitis C without hepatic coma: Secondary | ICD-10-CM | POA: Diagnosis not present

## 2015-10-13 DIAGNOSIS — I77811 Abdominal aortic ectasia: Secondary | ICD-10-CM | POA: Insufficient documentation

## 2015-10-13 DIAGNOSIS — K8020 Calculus of gallbladder without cholecystitis without obstruction: Secondary | ICD-10-CM | POA: Diagnosis not present

## 2015-11-03 DIAGNOSIS — M5416 Radiculopathy, lumbar region: Secondary | ICD-10-CM | POA: Diagnosis not present

## 2015-11-03 DIAGNOSIS — M5136 Other intervertebral disc degeneration, lumbar region: Secondary | ICD-10-CM | POA: Diagnosis not present

## 2015-11-04 DIAGNOSIS — K8020 Calculus of gallbladder without cholecystitis without obstruction: Secondary | ICD-10-CM | POA: Diagnosis not present

## 2015-11-04 DIAGNOSIS — K219 Gastro-esophageal reflux disease without esophagitis: Secondary | ICD-10-CM | POA: Diagnosis not present

## 2015-11-04 DIAGNOSIS — Z23 Encounter for immunization: Secondary | ICD-10-CM | POA: Diagnosis not present

## 2015-11-04 DIAGNOSIS — B1920 Unspecified viral hepatitis C without hepatic coma: Secondary | ICD-10-CM | POA: Diagnosis not present

## 2015-12-03 DIAGNOSIS — Z23 Encounter for immunization: Secondary | ICD-10-CM | POA: Diagnosis not present

## 2015-12-03 DIAGNOSIS — B1920 Unspecified viral hepatitis C without hepatic coma: Secondary | ICD-10-CM | POA: Diagnosis not present

## 2016-01-26 ENCOUNTER — Encounter: Payer: Self-pay | Admitting: Emergency Medicine

## 2016-01-26 ENCOUNTER — Emergency Department
Admission: EM | Admit: 2016-01-26 | Discharge: 2016-01-26 | Disposition: A | Discharge status: Home / Self Care | Payer: BLUE CROSS/BLUE SHIELD | Attending: Emergency Medicine | Admitting: Emergency Medicine

## 2016-01-26 DIAGNOSIS — I10 Essential (primary) hypertension: Secondary | ICD-10-CM | POA: Insufficient documentation

## 2016-01-26 DIAGNOSIS — R030 Elevated blood-pressure reading, without diagnosis of hypertension: Secondary | ICD-10-CM | POA: Diagnosis not present

## 2016-01-26 DIAGNOSIS — Z79899 Other long term (current) drug therapy: Secondary | ICD-10-CM | POA: Insufficient documentation

## 2016-01-26 DIAGNOSIS — Z0130 Encounter for examination of blood pressure without abnormal findings: Secondary | ICD-10-CM | POA: Diagnosis not present

## 2016-01-26 DIAGNOSIS — F17210 Nicotine dependence, cigarettes, uncomplicated: Secondary | ICD-10-CM | POA: Diagnosis not present

## 2016-01-26 DIAGNOSIS — M5136 Other intervertebral disc degeneration, lumbar region: Secondary | ICD-10-CM | POA: Diagnosis not present

## 2016-01-26 DIAGNOSIS — IMO0001 Reserved for inherently not codable concepts without codable children: Secondary | ICD-10-CM

## 2016-01-26 DIAGNOSIS — M5416 Radiculopathy, lumbar region: Secondary | ICD-10-CM | POA: Diagnosis not present

## 2016-01-26 LAB — BASIC METABOLIC PANEL
Anion gap: 7 (ref 5–15)
BUN: 10 mg/dL (ref 6–20)
CHLORIDE: 109 mmol/L (ref 101–111)
CO2: 24 mmol/L (ref 22–32)
CREATININE: 0.81 mg/dL (ref 0.61–1.24)
Calcium: 9.2 mg/dL (ref 8.9–10.3)
GFR calc Af Amer: >60 mL/min (ref >60)
GFR calc non Af Amer: >60 mL/min (ref >60)
Glucose, Bld: 123 mg/dL — ABNORMAL HIGH (ref 65–99)
Potassium: 3.9 mmol/L (ref 3.5–5.1)
SODIUM: 140 mmol/L (ref 135–145)

## 2016-01-26 LAB — CBC
HEMATOCRIT: 44.1 % (ref 40.0–52.0)
HEMOGLOBIN: 16.1 g/dL (ref 13.0–18.0)
MCH: 33.8 pg (ref 26.0–34.0)
MCHC: 36.6 g/dL — AB (ref 32.0–36.0)
MCV: 92.4 fL (ref 80.0–100.0)
Platelets: 187 10*3/uL (ref 150–440)
RBC: 4.77 MIL/uL (ref 4.40–5.90)
RDW: 12.8 % (ref 11.5–14.5)
WBC: 8.1 10*3/uL (ref 3.8–10.6)

## 2016-01-26 NOTE — ED Triage Notes (Signed)
Pt was sent over from PCP for further eval of high blood pressure. Pt was being seen for his Lumbar radiculitis and had an injection done today.

## 2016-01-26 NOTE — ED Provider Notes (Signed)
[facility] Emergency Department Provider Note  ____________________________________________  Time seen: Approximately 9:17 AM  I have reviewed the triage vital signs and the nursing notes.   HISTORY  Chief Complaint Hypertension   HPI Douglas Barnes is a 60 y.o. male with a history of hypertension not on medication since 2014 after losing weight, BPH, hepatitis C, and chronic back pain presents from physical rehab clinic for evaluation of elevated blood pressure. Patient was at the clinic today for a steroid injection in his spine which he gets regularly. He was noted to have elevated blood pressure in 3 separate measurements and he was sent to the ED for further evaluation. Patient denies chest pain, shortness of breath, abdominal pain, numbness or weakness of his extremities, facial droop, gait instability. Patient has a h/o chronic back pain and reports that his pain is 5/10 and same as always, located in the lumbar region, non radiating.  NO saddle anesthesia, urinary or bowel incontinence or retention. He reports he is used to be on blood pressure medication however hasn't required any in the last 3 years since losing weight. Patient is unclear why he was sent here for evaluation. He does endorse a mild frontal headache that his been having on a daily basis for months that usually resolves with tylenol or motrin. Currently HA is 1/10. No changes in vision, N/V  Past Medical History:  Diagnosis Date  . BPH (benign prostatic hypertrophy) with urinary retention   . Complication of anesthesia    "fear of not waking up"  . Foley catheter in place   . Hepatitis C antibody test positive    dx 1997--  pt is getting treatment in near future (last enzymes 12 /2016 stable per pt and in epic)  . History of colon polyps   . History of hypertension    no medication since 2014 lost weight and decrease stress    Patient Active Problem List   Diagnosis Date Noted    . Hematuria 07/11/2015  . BPH with obstruction/lower urinary tract symptoms 07/09/2015  . Hematuria, gross 07/09/2015    Past Surgical History:  Procedure Laterality Date  . COLONOSCOPY WITH PROPOFOL  last one 2015  . GREEN LIGHT LASER TURP (TRANSURETHRAL RESECTION OF PROSTATE N/A 07/09/2015   Procedure: GREEN LIGHT LASER TURP (TRANSURETHRAL RESECTION OF PROSTATE;  Surgeon: Festus Aloe, MD;  Location: [facility];  Service: Urology;  Laterality: N/A;  . INGUINAL HERNIA REPAIR Right age 7  . TRANSURETHRAL RESECTION OF PROSTATE N/A 07/09/2015   Procedure: CYSTOSCOPY WITH CLOT EVACUATION //FULGERATION OF BLEEDERS;  Surgeon: Alexis Frock, MD;  Location: [facility];  Service: Urology;  Laterality: N/A;    Prior to Admission medications   Medication Sig Start Date End Date Taking? Authorizing Provider  acetaminophen (TYLENOL) 325 MG tablet Take 650 mg by mouth every 6 (six) hours as needed.   Yes Historical Provider, MD  ibuprofen (ADVIL,MOTRIN) 200 MG tablet Take 400 mg by mouth every 6 (six) hours as needed for moderate pain.    Yes Historical Provider, MD  Ledipasvir-Sofosbuvir (HARVONI) 90-400 MG TABS Take by mouth.   Yes Historical Provider, MD  hyoscyamine (LEVSIN/SL) 0.125 MG SL tablet Place 1 tablet (0.125 mg total) under the tongue every 4 (four) hours as needed. Do not take more than 1.5 mg per day. Patient not taking: Reported on 01/26/2016 05/30/15   Elmyra Ricks Pisciotta, PA-C    Allergies Review of patient's allergies indicates no known allergies.  No  family history on file.  Social History Social History  Substance Use Topics  . Smoking status: Current Every Day Smoker    Packs/day: 0.25    Years: 15.00    Types: Cigarettes  . Smokeless tobacco: Never Used     Comment: 5 CIG. PER DAY  . Alcohol use No    Review of Systems  Constitutional: Negative for fever. Eyes: Negative for visual changes. ENT: Negative for sore throat. Cardiovascular: Negative for  chest pain. Respiratory: Negative for shortness of breath. Gastrointestinal: Negative for abdominal pain, vomiting or diarrhea. Genitourinary: Negative for dysuria. Musculoskeletal: Negative for back pain. Skin: Negative for rash. Neurological: Negative for weakness or numbness. + HA  ____________________________________________   PHYSICAL EXAM:  VITAL SIGNS: ED Triage Vitals  Enc Vitals Group     BP 01/26/16 0855 (!) 173/115     Pulse Rate 01/26/16 0855 66     Resp 01/26/16 0855 18     Temp 01/26/16 0855 97.8 F (36.6 C)     Temp Source 01/26/16 0855 Oral     SpO2 01/26/16 0855 99 %     Weight 01/26/16 0855 180 lb (81.6 kg)     Height 01/26/16 0855 5\' 9"  (1.753 m)     Head Circumference --      Peak Flow --      Pain Score 01/26/16 0856 6     Pain Loc --      Pain Edu? --      Excl. in [place]? --     Constitutional: Alert and oriented. Well appearing and in no apparent distress. HEENT:      Head: Normocephalic and atraumatic.         Eyes: Conjunctivae are normal. Sclera is non-icteric. EOMI. PERRL      Mouth/Throat: Mucous membranes are moist.       Neck: Supple with no signs of meningismus. Cardiovascular: Regular rate and rhythm. No murmurs, gallops, or rubs. 2+ symmetrical distal pulses are present in all extremities. No JVD. Respiratory: Normal respiratory effort. Lungs are clear to auscultation bilaterally. No wheezes, crackles, or rhonchi.  Gastrointestinal: Soft, non tender, and non distended with positive bowel sounds. No rebound or guarding. Genitourinary: No CVA tenderness. Musculoskeletal: Nontender with normal range of motion in all extremities. No edema, cyanosis, or erythema of extremities. Neurologic: Normal speech and language. A & O x3, PERRL, no nystagmus, CN II-XII intact, motor testing reveals good tone and bulk throughout. There is no evidence of pronator drift or dysmetria. Muscle strength is 5/5 throughout. Deep tendon reflexes are 2+ throughout with  downgoing toes. Sensory examination is intact. Gait is normal. Skin: Skin is warm, dry and intact. No rash noted. Psychiatric: Mood and affect are normal. Speech and behavior are normal.  ____________________________________________   LABS (all labs ordered are listed, but only abnormal results are displayed)  Labs Reviewed  CBC - Abnormal; Notable for the following:       Result Value   MCHC 36.6 (*)    All other components within normal limits  BASIC METABOLIC PANEL - Abnormal; Notable for the following:    Glucose, Bld 123 (*)    All other components within normal limits   ____________________________________________  EKG  ED ECG REPORT I, Rudene Re, the attending physician, personally viewed and interpreted this ECG. Normal sinus rhythm, rate of 63, normal intervals, normal axis, no ST elevations or depressions.  ____________________________________________  RADIOLOGY  none  ____________________________________________   PROCEDURES  Procedure(s) performed: None Procedures  Critical Care performed:  None ____________________________________________   INITIAL IMPRESSION / ASSESSMENT AND PLAN / ED COURSE  60 y.o. male with a history of hypertension not on medication since 2014 after losing weight, BPH, hepatitis C, and chronic back pain presents from physical rehab clinic for evaluation of asymptomatic elevated blood pressure after receiving a steroid injection on his back earlier this morning at PCP's office. Patient with no chest pain. Patient has 5 out of 10 back pain which is his baseline. VS showing elevated BP otherwise WNL. Neurologically intact. Unclear why patient was sent here. EKG within normal limits. No indication for imaging.   Patient with asymptomatic hypertension while in the doctor's office after receiving steroid injection on his back. BP probably component of pain and recent procedure. No intervention required. Plan to have patient f/u with PCP  for re-check BP and further management as needed.  Clinical Course    Pertinent labs & imaging results that were available during my care of the patient were reviewed by me and considered in my medical decision making (see chart for details).    ____________________________________________   FINAL CLINICAL IMPRESSION(S) / ED DIAGNOSES  Final diagnoses:  Elevated blood pressure      NEW MEDICATIONS STARTED DURING THIS VISIT:  New Prescriptions   No medications on file     Note:  This document was prepared using Dragon voice recognition software and may include unintentional dictation errors.    Rudene Re, MD 01/26/16 [phone]

## 2016-01-26 NOTE — Discharge Instructions (Signed)
Follow-up with her doctor in a week to have your blood pressure reevaluated. Return to the emergency room if you have a severe headache, chest pain, shortness of breath, abdominal pain, facial droop, weakness or numbness in one side of your body, or any other symptoms that are concerning to you.

## 2016-01-27 DIAGNOSIS — I10 Essential (primary) hypertension: Secondary | ICD-10-CM | POA: Diagnosis not present

## 2016-02-23 ENCOUNTER — Ambulatory Visit (INDEPENDENT_AMBULATORY_CARE_PROVIDER_SITE_OTHER): Payer: BLUE CROSS/BLUE SHIELD | Admitting: Family Medicine

## 2016-02-23 ENCOUNTER — Encounter: Payer: Self-pay | Admitting: Family Medicine

## 2016-02-23 VITALS — BP 116/75 | HR 82 | Ht 67.0 in | Wt 173.1 lb

## 2016-02-23 DIAGNOSIS — N138 Other obstructive and reflux uropathy: Secondary | ICD-10-CM

## 2016-02-23 DIAGNOSIS — B1920 Unspecified viral hepatitis C without hepatic coma: Secondary | ICD-10-CM | POA: Insufficient documentation

## 2016-02-23 DIAGNOSIS — Z23 Encounter for immunization: Secondary | ICD-10-CM | POA: Diagnosis not present

## 2016-02-23 DIAGNOSIS — I10 Essential (primary) hypertension: Secondary | ICD-10-CM | POA: Diagnosis not present

## 2016-02-23 DIAGNOSIS — Z87891 Personal history of nicotine dependence: Secondary | ICD-10-CM | POA: Diagnosis not present

## 2016-02-23 DIAGNOSIS — N401 Enlarged prostate with lower urinary tract symptoms: Secondary | ICD-10-CM | POA: Diagnosis not present

## 2016-02-23 DIAGNOSIS — B182 Chronic viral hepatitis C: Secondary | ICD-10-CM | POA: Diagnosis not present

## 2016-02-23 DIAGNOSIS — Z86010 Personal history of colonic polyps: Secondary | ICD-10-CM

## 2016-02-23 MED ORDER — HYDROCHLOROTHIAZIDE 25 MG PO TABS: 25.0000 mg | ORAL_TABLET | Freq: Every day | ORAL | 0 refills | Status: DC

## 2016-02-23 NOTE — Patient Instructions (Signed)
Mediterranean Diet  Why follow it? Research shows. . Those who follow the Mediterranean diet have a reduced risk of heart disease  . The diet is associated with a reduced incidence of Parkinson's and Alzheimer's diseases . People following the diet may have longer life expectancies and lower rates of chronic diseases  . The Dietary Guidelines for Americans recommends the Mediterranean diet as an eating plan to promote health and prevent disease  What Is the Mediterranean Diet?  . Healthy eating plan based on typical foods and recipes of Mediterranean-style cooking . The diet is primarily a plant based diet; these foods should make up a majority of meals   Starches - Plant based foods should make up a majority of meals - They are an important sources of vitamins, minerals, energy, antioxidants, and fiber - Choose whole grains, foods high in fiber and minimally processed items  - Typical grain sources include wheat, oats, barley, corn, brown rice, bulgar, farro, millet, polenta, couscous  - Various types of beans include chickpeas, lentils, fava beans, black beans, white beans   Fruits  Veggies - Large quantities of antioxidant rich fruits & veggies; 6 or more servings  - Vegetables can be eaten raw or lightly drizzled with oil and cooked  - Vegetables common to the traditional Mediterranean Diet include: artichokes, arugula, beets, broccoli, brussel sprouts, cabbage, carrots, celery, collard greens, cucumbers, eggplant, kale, leeks, lemons, lettuce, mushrooms, okra, onions, peas, peppers, potatoes, pumpkin, radishes, rutabaga, shallots, spinach, sweet potatoes, turnips, zucchini - Fruits common to the Mediterranean Diet include: apples, apricots, avocados, cherries, clementines, dates, figs, grapefruits, grapes, melons, nectarines, oranges, peaches, pears, pomegranates, strawberries, tangerines  Fats - Replace butter and margarine with healthy oils, such as olive oil, canola oil, and tahini   - Limit nuts to no more than a handful a day  - Nuts include walnuts, almonds, pecans, pistachios, pine nuts  - Limit or avoid candied, honey roasted or heavily salted nuts - Olives are central to the [employer] - can be eaten whole or used in a variety of dishes   Meats Protein - Limiting red meat: no more than a few times a month - When eating red meat: choose lean cuts and keep the portion to the size of deck of cards - Eggs: approx. 0 to 4 times a week  - Fish and lean poultry: at least 2 a week  - Healthy protein sources include, chicken, [place], lean beef, lamb - Increase intake of seafood such as tuna, salmon, trout, mackerel, shrimp, scallops - Avoid or limit high fat processed meats such as sausage and bacon  Dairy - Include moderate amounts of low fat dairy products  - Focus on healthy dairy such as fat free yogurt, skim milk, low or reduced fat cheese - Limit dairy products higher in fat such as whole or 2% milk, cheese, ice cream  Alcohol - Moderate amounts of red wine is ok  - No more than 5 oz daily for women (all ages) and men older than age 81  - No more than 10 oz of wine daily for men younger than 34  Other - Limit sweets and other desserts  - Use herbs and spices instead of salt to flavor foods  - Herbs and spices common to the traditional Mediterranean Diet include: basil, bay leaves, chives, cloves, cumin, fennel, garlic, lavender, marjoram, mint, oregano, parsley, pepper, rosemary, sage, savory, sumac, tarragon, thyme   It's not just a diet, it's a lifestyle:  . The  Mediterranean diet includes lifestyle factors typical of those in the region  . Foods, drinks and meals are best eaten with others and savored . Daily physical activity is important for overall good health . This could be strenuous exercise like running and aerobics . This could also be more leisurely activities such as walking, housework, yard-work, or taking the stairs . Moderation is the key;  a balanced and healthy diet accommodates most foods and drinks . Consider portion sizes and frequency of consumption of certain foods   Meal Ideas & Options:  . Breakfast:  o Whole wheat toast or whole wheat English muffins with peanut butter & hard boiled egg o Steel cut oats topped with apples & cinnamon and skim milk  o Fresh fruit: banana, strawberries, melon, berries, peaches  o Smoothies: strawberries, bananas, greek yogurt, peanut butter o Low fat greek yogurt with blueberries and granola  o Egg white omelet with spinach and mushrooms o Breakfast couscous: whole wheat couscous, apricots, skim milk, cranberries  . Sandwiches:  o Hummus and grilled vegetables (peppers, zucchini, squash) on whole wheat bread   o Grilled chicken on whole wheat pita with lettuce, tomatoes, cucumbers or tzatziki  o Tuna salad on whole wheat bread: tuna salad made with greek yogurt, olives, red peppers, capers, green onions o Garlic rosemary lamb pita: lamb sauted with garlic, rosemary, salt & pepper; add lettuce, cucumber, greek yogurt to pita - flavor with lemon juice and black pepper  . Seafood:  o Mediterranean grilled salmon, seasoned with garlic, basil, parsley, lemon juice and black pepper o Shrimp, lemon, and spinach whole-grain pasta salad made with low fat greek yogurt  o Seared scallops with lemon orzo  o Seared tuna steaks seasoned salt, pepper, coriander topped with tomato mixture of olives, tomatoes, olive oil, minced garlic, parsley, green onions and cappers  . Meats:  o Herbed greek chicken salad with kalamata olives, cucumber, feta  o Red bell peppers stuffed with spinach, bulgur, lean ground beef (or lentils) & topped with feta   o Kebabs: skewers of chicken, tomatoes, onions, zucchini, squash  o [place] burgers: made with red onions, mint, dill, lemon juice, feta cheese topped with roasted red peppers . Vegetarian o Cucumber salad: cucumbers, artichoke hearts, celery, red onion, feta  cheese, tossed in olive oil & lemon juice  o Hummus and whole grain pita points with a greek salad (lettuce, tomato, feta, olives, cucumbers, red onion) o Lentil soup with celery, carrots made with vegetable broth, garlic, salt and pepper  o Tabouli salad: parsley, bulgur, mint, scallions, cucumbers, tomato, radishes, lemon juice, olive oil, salt and pepper.     Top Ten Foods for Health  1. Water Drink at least 8 to 12 cups of water daily. Consume half of your body weight in pounds, is the amount of water in ounces to drink daily.  Ie: a 200lb person = 100 oz water daily  2. Dark Green Vegetables Eat dark green vegetables at least three to four times a week. Good options include broccoli, peppers, brussel sprouts and leafy greens like kale and spinach.  3. Whole Grains Whole grains should be included in your diet at least two to three times daily. Look for whole wheat flour, rye, oatmeal, barley, amaranth, quinoa or a multigrain. A good source of fiber includes 3 to 4 grams of fiber per serving. A great source has 5 or more grams of fiber per serving.  4. Beans and Lentils Try to eat a bean-based meal at  least once a week. Try to add legumes, including beans and lentils, to soups, stews, casseroles, salads and dips or eat them plain.  5. Fish Try to eat two to three serving of fish a week. A serving consists of 3 to 4 ounces of cooked fish. Good choices are salmon, trout, herring, bluefish, sardines and tuna.  6. Berries Include two to four servings of fruit in your diet each day. Try to eat berries such as raspberries, blueberries, blackberries and strawberries.  7. Winter Squash Eat butternut and acorn squash as well as other richly pigmented dark orange and green colored vegetables like sweet potato, cantaloupe and mango.  8. Soy 25 grams of soy protein a day is recommended as part of a low-fat diet to help lower cholesterol levels. Try tofu, soymilk, edamame soybeans, tempeh and  texturized vegetable protein (TVP).  9. Flaxseed, Nuts and Seeds Add 1 to 2 tablespoons of ground flaxseed or other seeds to food each day or include a moderate amount of nuts - 1/4 cup - in your daily diet.  10. Organic Yogurt Men and women between 57 and 84 years of age need 1000 milligrams of calcium a day and 1200 milligrams if 68 or older. Eat calcium-rich foods such as nonfat or low-fat dairy products three to four times a day. Include organic choices.

## 2016-02-23 NOTE — Assessment & Plan Note (Signed)
Started 28 days ago- 01/27/16.

## 2016-02-23 NOTE — Assessment & Plan Note (Signed)
22 yr pk hx- quit in 4/ 17

## 2016-02-23 NOTE — Progress Notes (Signed)
New patient office visit note:  Impression and Recommendations:    1. Chronic hepatitis C without hepatic coma ([place])   2. Essential hypertension   3. History of tobacco use   4. BPH with obstruction/lower urinary tract symptoms   5. History of colonic polyps   6. Need for prophylactic vaccination and inoculation against influenza   7. Need for Tdap vaccination      Hep C- txmnt per Gi- Dr Paulita Fujita.completed txmnt harvoni- has f/up soon w gi   HTN (hypertension) Started meds 28 days ago- 01/27/16.  Stable.  Needs RF of meds. Obtain bldwrk   History of tobacco use 20 yr pk hx- quit in 4/ 17   BPH/ urinary sx- stable, txmnt per urology- [place]  Pt was in the office today for 40+ minutes, with over 50% time spent in face to face counseling of various medical concerns and in coordination of care  Orders Placed This Encounter  Procedures  . Tdap vaccine greater than or equal to 7yo IM  . Flu Vaccine QUAD 36+ mos IM  . CBC with Differential/Platelet  . Comprehensive metabolic panel  . Lipid panel  . Hemoglobin A1c  . TSH  . Vitamin B12  . VITAMIN D 25 Hydroxy (Vit-D Deficiency, Fractures)  . T4, free    New Prescriptions   No medications on file    Modified Medications   Modified Medication Previous Medication   HYDROCHLOROTHIAZIDE (HYDRODIURIL) 25 MG TABLET hydrochlorothiazide (HYDRODIURIL) 25 MG tablet      Take 1 tablet (25 mg total) by mouth daily.    Take 25 mg by mouth every morning.    Discontinued Medications   HYOSCYAMINE (LEVSIN/SL) 0.125 MG SL TABLET    Place 1 tablet (0.125 mg total) under the tongue every 4 (four) hours as needed. Do not take more than 1.5 mg per day.    Return for bldwrk near future and then f/up OV with me.  The patient was counseled, risk factors were discussed, anticipatory guidance given.  Gross side effects, risk and benefits, and alternatives of medications discussed with patient.  Patient is aware that all  medications have potential side effects and we are unable to predict every side effect or drug-drug interaction that may occur.  Expresses verbal understanding and consents to current therapy plan and treatment regimen.  Please see AVS handed out to patient at the end of our visit for further patient instructions/ counseling done pertaining to today's office visit.    Note: This document was prepared using Dragon voice recognition software and may include unintentional dictation errors.  ----------------------------------------------------------------------------------------------------------------------    Subjective:      HPI: Douglas Barnes is a pleasant 60 y.o. male who presents to [place] at [facility] today to review their medical history with me and establish care.   I asked the patient to review their chronic problem list with me to ensure everything was updated and accurate.    Married- Christina- 3 kids. Pt works as Freight forwarder at [employer]  Only + on ROS is "Age related generalized muscle and jt aches".   20 pk yr hx- quit in April '17.  Needs TDap and Flu Vaccines.    Wt Readings from Last 3 Encounters:  02/23/16 173 lb 1.6 oz (78.5 kg)  01/26/16 180 lb (81.6 kg)  07/09/15 167 lb (75.8 kg)   BP Readings from Last 3 Encounters:  02/23/16 116/75  01/26/16 (!) 174/135  07/11/15 120/72   Pulse Readings from Last 3 Encounters:  02/23/16 82  01/26/16 88  07/11/15 85   BMI Readings from Last 3 Encounters:  02/23/16 27.11 kg/m  01/26/16 26.58 kg/m  07/09/15 25.39 kg/m    Patient Active Problem List   Diagnosis Date Noted  . HTN (hypertension) 02/23/2016  . History of tobacco use 02/23/2016  . Hepatitis C infection- s/p harvoni 02/23/2016  . History of colonic polyps 02/23/2016  . Hematuria 07/11/2015  . BPH with obstruction/lower urinary tract symptoms 07/09/2015  . DDD (degenerative disc disease), lumbar 12/01/2013  .  Lumbar radiculitis 12/01/2013     Past Medical History:  Diagnosis Date  . BPH (benign prostatic hypertrophy) with urinary retention   . Complication of anesthesia    "fear of not waking up"  . Foley catheter in place   . Hepatitis C antibody test positive    dx 1997--  pt is getting treatment in near future (last enzymes 12 /2016 stable per pt and in epic)  . History of colon polyps   . History of hypertension    no medication since 2014 lost weight and decrease stress  . Hypertension      Past Surgical History:  Procedure Laterality Date  . ANAL FISSURE REPAIR    . COLONOSCOPY WITH PROPOFOL  last one 2015  . GREEN LIGHT LASER TURP (TRANSURETHRAL RESECTION OF PROSTATE N/A 07/09/2015   Procedure: GREEN LIGHT LASER TURP (TRANSURETHRAL RESECTION OF PROSTATE;  Surgeon: Festus Aloe, MD;  Location: [facility];  Service: Urology;  Laterality: N/A;  . INGUINAL HERNIA REPAIR Right age 64  . TRANSURETHRAL RESECTION OF PROSTATE N/A 07/09/2015   Procedure: CYSTOSCOPY WITH CLOT EVACUATION //FULGERATION OF BLEEDERS;  Surgeon: Alexis Frock, MD;  Location: [facility];  Service: Urology;  Laterality: N/A;     Family History  Problem Relation Age of Onset  . Cancer Mother 51    BONE  . Cancer Father 101    COLON  . Hypertension Brother      History  Drug Use No    History  Alcohol Use No    History  Smoking Status  . Former Smoker  . Packs/day: 0.50  . Years: 25.00  . Types: Cigarettes  Smokeless Tobacco  . Never Used    Comment: 5 CIG. PER DAY    Patient's Medications  New Prescriptions   No medications on file  Previous Medications   ACETAMINOPHEN (TYLENOL) 325 MG TABLET    Take 650 mg by mouth every 6 (six) hours as needed.   IBUPROFEN (ADVIL,MOTRIN) 200 MG TABLET    Take 400 mg by mouth every 6 (six) hours as needed for moderate pain.    LEDIPASVIR-SOFOSBUVIR (HARVONI) 90-400 MG TABS    Take by mouth.   PROBIOTIC PRODUCT (PROBIOTIC-10) CAPS     Take 1 tablet by mouth daily.  Modified Medications   Modified Medication Previous Medication   HYDROCHLOROTHIAZIDE (HYDRODIURIL) 25 MG TABLET hydrochlorothiazide (HYDRODIURIL) 25 MG tablet      Take 1 tablet (25 mg total) by mouth daily.    Take 25 mg by mouth every morning.  Discontinued Medications   HYOSCYAMINE (LEVSIN/SL) 0.125 MG SL TABLET    Place 1 tablet (0.125 mg total) under the tongue every 4 (four) hours as needed. Do not take more than 1.5 mg per day.    Allergies: Review of patient's allergies indicates no known allergies.  Review of Systems  Constitutional: Negative.  Negative for chills, diaphoresis,  fever, malaise/fatigue and weight loss.  HENT: Negative.  Negative for congestion, sore throat and tinnitus.   Eyes: Negative.  Negative for blurred vision, double vision and photophobia.  Respiratory: Negative.  Negative for cough and wheezing.   Cardiovascular: Negative.  Negative for chest pain and palpitations.  Gastrointestinal: Negative for blood in stool, diarrhea, nausea and vomiting.  Genitourinary: Negative.  Negative for dysuria, frequency and urgency.  Musculoskeletal: Positive for myalgias. Negative for joint pain.  Skin: Negative.  Negative for itching and rash.  Neurological: Negative.  Negative for dizziness, focal weakness, weakness and headaches.  Endo/Heme/Allergies: Negative.  Negative for environmental allergies and polydipsia. Does not bruise/bleed easily.  Psychiatric/Behavioral: Negative for depression and memory loss. The patient is not nervous/anxious and does not have insomnia.      Objective:   Blood pressure 116/75, pulse 82, height 5\' 7"  (1.702 m), weight 173 lb 1.6 oz (78.5 kg). Body mass index is 27.11 kg/m. General: Well Developed, well nourished, and in no acute distress.  Neuro: Alert and oriented x3, extra-ocular muscles intact, sensation grossly intact.  HEENT: Normocephalic, atraumatic, pupils equal round reactive to light, neck  supple Skin: no gross suspicious lesions or rashes  Cardiac: Regular rate and rhythm, no murmurs rubs or gallops.  Respiratory: Essentially clear to auscultation bilaterally. Not using accessory muscles, speaking in full sentences.  Abdominal: Soft, not grossly distended Musculoskeletal: Ambulates w/o diff, FROM * 4 ext.  Vasc: less 2 sec cap RF, warm and pink  Psych:  No HI/SI, judgement and insight good, Euthymic mood. Full Affect.

## 2016-03-22 ENCOUNTER — Other Ambulatory Visit: Payer: BLUE CROSS/BLUE SHIELD

## 2016-04-12 DIAGNOSIS — M5416 Radiculopathy, lumbar region: Secondary | ICD-10-CM | POA: Diagnosis not present

## 2016-04-12 DIAGNOSIS — M5136 Other intervertebral disc degeneration, lumbar region: Secondary | ICD-10-CM | POA: Diagnosis not present

## 2016-04-17 ENCOUNTER — Telehealth: Payer: Self-pay

## 2016-04-17 NOTE — Telephone Encounter (Signed)
Pt's spouse called stating that pt's blood pressure has been running 130s/90s.  Scheduled pt for f/u with Dr. Raliegh Scarlet.  Advised pt's spouse that if blood pressure is greater than [id number] diastolically  Prior to his appointment then he should proceed to ER for evaluation and possible treatment.  She also stated that pt has been having intermittent chest pains for several months and he usually just takes an aspirin when this occurs.  Advised spouse that at any time he has chest pains, he should be seen in the ER immediately for evaluation.  Spouse expressed understanding and is agreeable.  Charyl Bigger, CMA

## 2016-04-25 ENCOUNTER — Ambulatory Visit: Payer: Self-pay | Admitting: Family Medicine

## 2016-04-25 ENCOUNTER — Other Ambulatory Visit: Payer: Self-pay

## 2016-05-09 DIAGNOSIS — M5136 Other intervertebral disc degeneration, lumbar region: Secondary | ICD-10-CM | POA: Diagnosis not present

## 2016-05-09 DIAGNOSIS — M5416 Radiculopathy, lumbar region: Secondary | ICD-10-CM | POA: Diagnosis not present

## 2016-05-11 DIAGNOSIS — B1920 Unspecified viral hepatitis C without hepatic coma: Secondary | ICD-10-CM | POA: Diagnosis not present

## 2016-05-21 ENCOUNTER — Other Ambulatory Visit: Payer: Self-pay | Admitting: Family Medicine

## 2016-05-25 ENCOUNTER — Other Ambulatory Visit: Payer: Self-pay

## 2016-05-30 ENCOUNTER — Ambulatory Visit (INDEPENDENT_AMBULATORY_CARE_PROVIDER_SITE_OTHER): Payer: BLUE CROSS/BLUE SHIELD | Admitting: Family Medicine

## 2016-05-30 ENCOUNTER — Encounter: Payer: Self-pay | Admitting: Family Medicine

## 2016-05-30 VITALS — BP 122/84 | HR 69 | Ht 67.0 in | Wt 192.6 lb

## 2016-05-30 DIAGNOSIS — E639 Nutritional deficiency, unspecified: Secondary | ICD-10-CM

## 2016-05-30 DIAGNOSIS — E65 Localized adiposity: Secondary | ICD-10-CM | POA: Insufficient documentation

## 2016-05-30 DIAGNOSIS — I10 Essential (primary) hypertension: Secondary | ICD-10-CM

## 2016-05-30 DIAGNOSIS — E66811 Obesity, class 1: Secondary | ICD-10-CM

## 2016-05-30 DIAGNOSIS — E669 Obesity, unspecified: Secondary | ICD-10-CM | POA: Diagnosis not present

## 2016-05-30 DIAGNOSIS — Z723 Lack of physical exercise: Secondary | ICD-10-CM

## 2016-05-30 DIAGNOSIS — Z87891 Personal history of nicotine dependence: Secondary | ICD-10-CM | POA: Diagnosis not present

## 2016-05-30 MED ORDER — HYDROCHLOROTHIAZIDE 25 MG PO TABS: 25.0000 mg | ORAL_TABLET | Freq: Every day | ORAL | 1 refills | Status: DC

## 2016-05-30 NOTE — Patient Instructions (Addendum)
really work on Lifestyle changes that we discussed prior.  Check Bp's at home-  Write it down on log- bring in sooner than planned if remains consistently above goal  Please give pt a Bp log    Guidelines for a Low Sodium Diet   Low Sodium Diet A main source of sodium is table salt. The average American eats five or more teaspoons of salt each day. This is about 20 times as much as the body needs. In fact, your body needs only 1/4 teaspoon of salt every day. Sodium is found naturally in foods, but a lot of it is added during processing and preparation. Many foods that do not taste salty may still be high in sodium. Large amounts of sodium can be hidden in canned, processed and convenience foods. And sodium can be found in many foods that are served at [employer].  Sodium controls fluid balance in our bodies and maintains blood volume and blood pressure. Eating too much sodium may raise blood pressure and cause fluid retention, which could lead to swelling of the legs and feet or other health issues.  When limiting sodium in your diet, a common target is to eat less than 2,000 milligrams of sodium per day.   General Guidelines for Cutting Down on Salt Eliminate salty foods from your diet and reduce the amount of salt used in cooking. Sea salt is no better than regular salt.  Choose low sodium foods. Many salt-free or reduced salt products are available. When reading food labels, low sodium is defined as 140 mg of sodium per serving.  Salt substitutes are sometimes made from potassium, so read the label. If you are on a low potassium diet, then check with your doctor before using those salt substitutes.  Be creative and season your foods with spices, herbs, lemon, garlic, ginger, vinegar and pepper. Remove the salt shaker from the table.  Read ingredient labels to identify foods high in sodium. Items with 400 mg or more of sodium are high in sodium. High sodium food additives  include salt, brine, or other items that say sodium, such as monosodium glutamate.  Eat more home-cooked meals. Foods cooked from scratch are naturally lower in sodium than most instant and boxed mixes.  Don't use softened water for cooking and drinking since it contains added salt.  Avoid medications which contain sodium such as [employer].  For more information; food composition books are available which tell how much sodium is in food. Online sources such as www.calorieking.com also list amounts.     Meats, Poultry, Fish, Legumes, Eggs and Nuts  High-Sodium Foods: Smoked, cured, salted or canned meat, fish or poultry including bacon, cold cuts, ham, frankfurters, sausage, sardines, caviar and anchovies Frozen breaded meats and dinners, such as burritos and pizza Canned entrees, such as ravioli, spam and chili Salted nuts Beans canned with salt added  Low-Sodium Alternatives: Any fresh or frozen beef, lamb, pork, poultry and fish Eggs and egg substitutes Low-sodium peanut butter Dry peas and beans (not canned) Low-sodium canned fish Drained, water or oil packed canned fish or poultry   Dairy Products  High-Sodium Foods: Buttermilk Regular and processed cheese, cheese spreads and sauces Cottage cheese  Low-Sodium Alternatives: Milk, yogurt, ice cream and ice milk Low-sodium cheeses, cream cheese, ricotta cheese and mozzarella   Breads, Grains and Cereals  High-Sodium Foods: Bread and rolls with salted tops Quick breads, self-rising flour, biscuit, pancake and waffle mixes Pizza, croutons and salted  crackers Prepackaged, processed mixes for potatoes, rice, pasta and stuffing  Low-Sodium Alternatives: Breads, bagels and rolls without salted tops Muffins and most ready-to-eat cereals All rice and pasta, but do not to add salt when cooking Low-sodium corn and flour tortillas and noodles Low-sodium crackers and breadsticks Unsalted popcorn,  chips and pretzels      Vegetables and Fruits  High-Sodium Foods: Regular canned vegetables and vegetable juices Olives, pickles, sauerkraut and other pickled vegetables Vegetables made with ham, bacon or salted pork Packaged mixes, such as scalloped or au gratin potatoes, frozen hash browns and Tater Tots Commercially prepared pasta and tomato sauces and salsa  Low-Sodium Alternatives: Fresh and frozen vegetables without sauces Low-sodium canned vegetables, sauces and juices Fresh potatoes, frozen [place] fries and instant mashed potatoes Low-salt tomato or V-8 juice. Most fresh, frozen and canned fruit Dried fruits   Soups  High-Sodium Foods: Regular canned and dehydrated soup, broth and bouillon Cup of noodles and seasoned ramen mixes  Low-Sodium Alternatives: Low-sodium canned and dehydrated soups, broth and bouillon Homemade soups without added salt   Fats, Desserts and Sweets  High-Sodium Foods: Soy sauce, seasoning salt, other sauces and marinades Bottled salad dressings, regular salad dressing with bacon bits Salted butter or margarine Instant pudding and cake Large portions of ketchup, mustard  Low-Sodium Alternatives: Vinegar, unsalted butter or margarine Vegetable oils and low sodium sauces and salad dressings Mayonnaise All desserts made without salt

## 2016-05-30 NOTE — Progress Notes (Signed)
Impression and Recommendations:    1. Essential hypertension   2. Obesity, Class I, BMI 30-34.9   3. Poor diet   4. Inactivity- not working out and no cardio   5. History of tobacco use     HTN (hypertension) - in addition to HCTZ, add losartan; So Hyzaar started today.    - Obtain labs- see below  - Lifestyle changes such as dash diet and engaging in a regular exercise program discussed with patient.   - Educational handouts provided if pt desired  - Ambulatory BP monitoring encouraged. Keep log and bring in next OV.  F/up sooner than planned if not at goal or issues.   - wt loss   Obesity, Class I, BMI 30-34.9 Explained to patient what BMI refers to, and what it means medically.    Told patient to think about it as a "medical risk stratification measurement" and how increasing BMI is associated with increasing risk/ or worsening state of various diseases such as hypertension, hyperlipidemia, diabetes, premature OA, depression etc.  American Heart Association guidelines for healthy diet, basically Mediterranean diet, and exercise guidelines of 30 minutes 5 days per week or more discussed in detail.  Health counseling performed.  All questions answered.   Poor diet Declines nutrition c/s    Inactivity- not working out and no cardio ACSM exercise recs r/w pt.  - join [employer] or just start walking.   - labs today--> see orders.  - will need yrly CPE- told to schedule     History of tobacco use Prefer BP to be lower due to his PMHx and current med issues.   Education and routine counseling performed. Handouts provided.   New Prescriptions   LOSARTAN-HYDROCHLOROTHIAZIDE (HYZAAR) 100-25 MG TABLET    Take 1 tablet by mouth daily.   VITAMIN D, ERGOCALCIFEROL, (DRISDOL) 50000 UNITS CAPS CAPSULE    Take 1 capsule (50,000 Units total) by mouth every 7 (seven) days.    Modified Medications   No medications on file    Discontinued Medications   HYDROCHLOROTHIAZIDE (HYDRODIURIL) 25 MG TABLET    TAKE 1 TABLET BY MOUTH EVERY DAY   HYDROCHLOROTHIAZIDE (HYDRODIURIL) 25 MG TABLET    Take 1 tablet (25 mg total) by mouth daily.   LEDIPASVIR-SOFOSBUVIR (HARVONI) 90-400 MG TABS    Take by mouth.     Orders Placed This Encounter  Procedures  . CBC with Differential/Platelet  . Comprehensive metabolic panel  . Lipid panel  . TSH  . T4, free  . Vitamin B12  . VITAMIN D 25 Hydroxy (Vit-D Deficiency, Fractures)  . POCT glycosylated hemoglobin (Hb A1C)     Return in about 4 months (around 09/27/2016) for Follow-up of current medical issues- BP, diet and lifestye changes.  The patient was counseled, risk factors were discussed, anticipatory guidance given.  Gross side effects, risk and benefits, and alternatives of medications discussed with patient.  Patient is aware that all medications have potential side effects and we are unable to predict every side effect or drug-drug interaction that may occur.  Expresses verbal understanding and consents to current therapy plan and treatment regimen.  Please see AVS handed out to patient at the end of our visit for further patient instructions/ counseling done pertaining to today's office visit.    Note: This document was prepared using Dragon voice recognition software and may include unintentional dictation errors.     Subjective:    Chief Complaint  Patient presents with  .  Hypertension    HPI: Douglas Barnes is a 61 y.o. male who presents to [place] [facility] today for 6-mo follow up for HTN, wt mgt, .     HTN: Home BP readings have been running in the - pt not checking.  Today- pretty well controlled. ASX.  Elevated at doc offices when he goes for his chronic pain injections.   Tolerating BP med well.  Taking as prescribed.  Denies HA, dizziness, CP, SOB, Visual changes, increasing pedal edema.   Exercise: no;  Diet Pattern: ok;  Salt Restriction: none- still  eating chips.   Wt-  Up almost 20 lbs from prior. No prudent diet or exercise.    fasting other than coffee with french vanilla.  Will obtain bld work today    Patient Care Team    Relationship Specialty Notifications Start End  Mellody Dance, DO PCP - General Family Medicine  02/23/16   Arta Silence, MD Consulting Physician Gastroenterology  02/23/16   Danella Sensing, MD Consulting Physician Dermatology  02/23/16   Festus Aloe, MD Consulting Physician Urology  02/23/16   Sharlet Salina, MD Referring Physician Physical Medicine and Rehabilitation  02/23/16    Comment: lower back/ transformainal injections     Wt Readings from Last 3 Encounters:  05/30/16 192 lb 9.6 oz (87.4 kg)  02/23/16 173 lb 1.6 oz (78.5 kg)  01/26/16 180 lb (81.6 kg)    BP Readings from Last 3 Encounters:  05/30/16 122/84  02/23/16 116/75  01/26/16 (!) 174/135    Pulse Readings from Last 3 Encounters:  05/30/16 69  02/23/16 82  01/26/16 88    BMI Readings from Last 3 Encounters:  05/30/16 30.17 kg/m  02/23/16 27.11 kg/m  01/26/16 26.58 kg/m     Lab Results  Component Value Date   CREATININE 0.74 (L) 05/30/2016   BUN 16 05/30/2016   NA 141 05/30/2016   K 4.2 05/30/2016   CL 103 05/30/2016   CO2 25 05/30/2016    Lab Results  Component Value Date   CHOL 176 05/30/2016    Lab Results  Component Value Date   HDL 36 (L) 05/30/2016    Lab Results  Component Value Date   LDLCALC 113 (H) 05/30/2016    Lab Results  Component Value Date   TRIG 134 05/30/2016    Lab Results  Component Value Date   CHOLHDL 4.9 05/30/2016    No results found for: LDLDIRECT ===================================================================  Patient Active Problem List   Diagnosis Date Noted  . Obesity, Class I, BMI 30-34.9 05/30/2016    Priority: High  . HTN (hypertension) 02/23/2016    Priority: High  . Hepatitis C infection- s/p harvoni 02/23/2016    Priority: Medium  .  History of colonic polyps 02/23/2016    Priority: Medium  . 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 Poor diet 05/30/2016    Priority: Low  . Inactivity- not working out and no cardio 05/30/2016    Priority: Low  . Hematuria- chronic (followed by Dr Junious Silk urology) 07/11/2015    Priority: Low  . History of tobacco use 02/23/2016  . DDD (degenerative disc disease), lumbar 12/01/2013  . Lumbar radiculitis 12/01/2013    Past Medical History:  Diagnosis Date  . BPH (benign prostatic hypertrophy) with urinary retention   . Complication of anesthesia    "fear of not waking up"  . Foley catheter in place   .  Hepatitis C antibody test positive    dx 1997--  pt is getting treatment in near future (last enzymes 12 /2016 stable per pt and in epic)  . History of colon polyps   . History of hypertension    no medication since 2014 lost weight and decrease stress  . Hypertension     Past Surgical History:  Procedure Laterality Date  . ANAL FISSURE REPAIR    . COLONOSCOPY WITH PROPOFOL  last one 2015  . GREEN LIGHT LASER TURP (TRANSURETHRAL RESECTION OF PROSTATE N/A 07/09/2015   Procedure: GREEN LIGHT LASER TURP (TRANSURETHRAL RESECTION OF PROSTATE;  Surgeon: Festus Aloe, MD;  Location: [facility];  Service: Urology;  Laterality: N/A;  . INGUINAL HERNIA REPAIR Right age 14  . TRANSURETHRAL RESECTION OF PROSTATE N/A 07/09/2015   Procedure: CYSTOSCOPY WITH CLOT EVACUATION //FULGERATION OF BLEEDERS;  Surgeon: Alexis Frock, MD;  Location: [facility];  Service: Urology;  Laterality: N/A;    Family History  Problem Relation Age of Onset  . Cancer Mother 25    BONE  . Cancer Father 33    COLON  . Hypertension Brother     History  Drug Use No  ,  History  Alcohol Use No  ,  History  Smoking Status  . Former Smoker  . Packs/day: 0.50  . Years: 25.00  . Types: Cigarettes  Smokeless Tobacco  . Never Used     Comment: 5 CIG. PER DAY  ,    Current Outpatient Prescriptions on File Prior to Visit  Medication Sig Dispense Refill  . acetaminophen (TYLENOL) 325 MG tablet Take 650 mg by mouth every 6 (six) hours as needed.    Marland Kitchen ibuprofen (ADVIL,MOTRIN) 200 MG tablet Take 400 mg by mouth every 6 (six) hours as needed for moderate pain.     . Probiotic Product (PROBIOTIC-10) CAPS Take 1 tablet by mouth daily.     Current Facility-Administered Medications on File Prior to Visit  Medication Dose Route Frequency Provider Last Rate Last Dose  . dextrose 5 %-0.45 % sodium chloride infusion   Intravenous Continuous Alexis Frock, MD        No Known Allergies  Review of Systems  Constitutional: Negative for diaphoresis and weight loss.  HENT: Negative for nosebleeds.   Eyes: Negative for blurred vision and double vision.  Respiratory: Negative for shortness of breath and wheezing.   Cardiovascular: Negative for chest pain and palpitations.  Gastrointestinal: Negative for diarrhea, nausea and vomiting.  Musculoskeletal: Positive for myalgias. Negative for falls.       Chronic problem   Skin: Negative for rash.  Neurological: Negative for dizziness and focal weakness.  Endo/Heme/Allergies: Negative for polydipsia.  Psychiatric/Behavioral: Negative for memory loss.    Objective:   Blood pressure 122/84, pulse 69, height 5\' 7"  (1.702 m), weight 192 lb 9.6 oz (87.4 kg). Body mass index is 30.17 kg/m. General: Well Developed, well nourished, and in no acute distress.  HEENT: Normocephalic, atraumatic, pupils equal round reactive to light, neck supple, No carotid bruits, no JVD Skin: Warm and dry, cap RF less 2 sec Cardiac: Regular rate and rhythm, S1, S2 WNL's, no murmurs rubs or gallops Respiratory: ECTA B/L, Not using accessory muscles, speaking in full sentences. NeuroM-Sk: Ambulates w/o assistance, moves ext * 4 w/o difficulty, sensation grossly intact.  Ext: scant edema b/l lower ext Psych: No  HI/SI, judgement and insight good, Euthymic mood. Full Affect.

## 2016-05-31 LAB — CBC WITH DIFFERENTIAL/PLATELET
Basophils Absolute: 0 10*3/uL (ref 0.0–0.2)
Basos: 0 %
EOS (ABSOLUTE): 0.1 10*3/uL (ref 0.0–0.4)
Eos: 2 %
Hematocrit: 48.4 % (ref 37.5–51.0)
Hemoglobin: 16.3 g/dL (ref 13.0–17.7)
Immature Grans (Abs): 0 10*3/uL (ref 0.0–0.1)
Immature Granulocytes: 0 %
Lymphocytes Absolute: 2.3 10*3/uL (ref 0.7–3.1)
Lymphs: 33 %
MCH: 31.5 pg (ref 26.6–33.0)
MCHC: 33.7 g/dL (ref 31.5–35.7)
MCV: 94 fL (ref 79–97)
Monocytes Absolute: 0.5 10*3/uL (ref 0.1–0.9)
Monocytes: 7 %
Neutrophils Absolute: 4 10*3/uL (ref 1.4–7.0)
Neutrophils: 58 %
Platelets: 200 10*3/uL (ref 150–379)
RBC: 5.17 x10E6/uL (ref 4.14–5.80)
RDW: 13.3 % (ref 12.3–15.4)
WBC: 7 10*3/uL (ref 3.4–10.8)

## 2016-05-31 LAB — COMPREHENSIVE METABOLIC PANEL WITH GFR
ALT: 14 [IU]/L (ref 0–44)
AST: 13 [IU]/L (ref 0–40)
Albumin/Globulin Ratio: 1.5 (ref 1.2–2.2)
Albumin: 4.6 g/dL (ref 3.5–5.5)
Alkaline Phosphatase: 57 [IU]/L (ref 39–117)
BUN/Creatinine Ratio: 22 — ABNORMAL HIGH (ref 9–20)
BUN: 16 mg/dL (ref 6–24)
Bilirubin Total: 1 mg/dL (ref 0.0–1.2)
CO2: 25 mmol/L (ref 18–29)
Calcium: 9.2 mg/dL (ref 8.7–10.2)
Chloride: 103 mmol/L (ref 96–106)
Creatinine, Ser: 0.74 mg/dL — ABNORMAL LOW (ref 0.76–1.27)
GFR calc Af Amer: 118 mL/min/{1.73_m2}
GFR calc non Af Amer: 102 mL/min/{1.73_m2}
Globulin, Total: 3 g/dL (ref 1.5–4.5)
Glucose: 105 mg/dL — ABNORMAL HIGH (ref 65–99)
Potassium: 4.2 mmol/L (ref 3.5–5.2)
Sodium: 141 mmol/L (ref 134–144)
Total Protein: 7.6 g/dL (ref 6.0–8.5)

## 2016-05-31 LAB — LIPID PANEL
Chol/HDL Ratio: 4.9 ratio (ref 0.0–5.0)
Cholesterol, Total: 176 mg/dL (ref 100–199)
HDL: 36 mg/dL — ABNORMAL LOW
LDL Calculated: 113 mg/dL — ABNORMAL HIGH (ref 0–99)
Triglycerides: 134 mg/dL (ref 0–149)
VLDL Cholesterol Cal: 27 mg/dL (ref 5–40)

## 2016-05-31 LAB — VITAMIN B12: VITAMIN B 12: 356 pg/mL (ref 232–1245)

## 2016-05-31 LAB — T4, FREE: Free T4: 1.13 ng/dL (ref 0.82–1.77)

## 2016-05-31 LAB — TSH: TSH: 2.34 u[IU]/mL (ref 0.450–4.500)

## 2016-05-31 LAB — VITAMIN D 25 HYDROXY (VIT D DEFICIENCY, FRACTURES): Vit D, 25-Hydroxy: 8.7 ng/mL — ABNORMAL LOW (ref 30.0–100.0)

## 2016-06-02 DIAGNOSIS — B1920 Unspecified viral hepatitis C without hepatic coma: Secondary | ICD-10-CM | POA: Diagnosis not present

## 2016-06-02 DIAGNOSIS — Z86010 Personal history of colonic polyps: Secondary | ICD-10-CM | POA: Diagnosis not present

## 2016-06-09 ENCOUNTER — Telehealth: Payer: Self-pay | Admitting: Family Medicine

## 2016-06-09 ENCOUNTER — Other Ambulatory Visit: Payer: Self-pay | Admitting: Family Medicine

## 2016-06-09 DIAGNOSIS — I10 Essential (primary) hypertension: Secondary | ICD-10-CM

## 2016-06-09 LAB — POCT GLYCOSYLATED HEMOGLOBIN (HGB A1C): Hemoglobin A1C: 5.7

## 2016-06-09 MED ORDER — VITAMIN D (ERGOCALCIFEROL) 1.25 MG (50000 UNIT) PO CAPS: 50000.0000 [IU] | ORAL_CAPSULE | ORAL | 3 refills | Status: DC

## 2016-06-09 MED ORDER — LOSARTAN POTASSIUM-HCTZ 100-25 MG PO TABS: 1.0000 | ORAL_TABLET | Freq: Every day | ORAL | 3 refills | Status: DC

## 2016-06-09 NOTE — Telephone Encounter (Signed)
Douglas Barnes called and states his blood pressure has been running high. Denies chest pains or shortness of breath. He does have headaches and feels tired. He is taking the HCTZ daily.   190/142 147/99 188/112  This morning when his blood pressure was 190/142 his wife gave him a klonopin 0.5 mg. Two hours later his blood pressure dropped to 126/78.   He states Dr Raliegh Scarlet wanted to start him on an additional BP medication at his last visit. He would like the medication sent to [place].

## 2016-06-09 NOTE — Telephone Encounter (Signed)
Patient has a clinical concern about his BP med and would like a call back

## 2016-06-09 NOTE — Progress Notes (Signed)
Patient advised of recommendations.  

## 2016-06-09 NOTE — Progress Notes (Signed)
Levada Dy, tell pt to stop his HCTZ plain BP med---> and start taking this combo tablet.   Cont to montior BP- bring in log in aboiut 3-4 months.   I D/ced HCTZ and added Hyzaar.  THANKS for letting him know

## 2016-09-04 DIAGNOSIS — B1920 Unspecified viral hepatitis C without hepatic coma: Secondary | ICD-10-CM | POA: Diagnosis not present

## 2016-09-07 NOTE — Assessment & Plan Note (Addendum)
-   in addition to HCTZ, add losartan; So Hyzaar started today.    - Obtain labs- see below  - Lifestyle changes such as dash diet and engaging in a regular exercise program discussed with patient.   - Educational handouts provided if pt desired  - Ambulatory BP monitoring encouraged. Keep log and bring in next OV.  F/up sooner than planned if not at goal or issues.   - wt loss

## 2016-09-07 NOTE — Assessment & Plan Note (Signed)
Prefer BP to be lower due to his PMHx and current med issues.

## 2016-09-07 NOTE — Assessment & Plan Note (Addendum)
ACSM exercise recs r/w pt.  - join [employer] or just start walking.   - labs today--> see orders.  - will need yrly CPE- told to schedule

## 2016-09-07 NOTE — Assessment & Plan Note (Signed)
Declines nutrition c/s

## 2016-09-07 NOTE — Assessment & Plan Note (Signed)

## 2016-09-11 ENCOUNTER — Other Ambulatory Visit: Payer: Self-pay | Admitting: Adult Health

## 2016-09-11 ENCOUNTER — Telehealth: Payer: Self-pay | Admitting: Family Medicine

## 2016-09-11 DIAGNOSIS — F419 Anxiety disorder, unspecified: Secondary | ICD-10-CM

## 2016-09-11 MED ORDER — ALPRAZOLAM 0.5 MG PO TBDP: 0.5000 mg | ORAL_TABLET | Freq: Two times a day (BID) | ORAL | 0 refills | Status: DC | PRN

## 2016-09-11 NOTE — Telephone Encounter (Signed)
RX faxed to pharmacy.  LVM informing pt.  Charyl Bigger, CMA

## 2016-09-11 NOTE — Telephone Encounter (Signed)
Patient is taking a business trip and will be flying. He said that it should be noted in his chart about his fear of flying and is requesting 8 Xanax pills for the trips duration. He would like a call today if at all possible.

## 2016-09-11 NOTE — Telephone Encounter (Signed)
10 tab rx provided. Thanks [employer]

## 2016-09-11 NOTE — Progress Notes (Signed)
Anxiety r/t air travel. 10 tabs of Alprazolam provided.

## 2016-09-11 NOTE — Telephone Encounter (Signed)
Please advise.  T. Nelson, CMA 

## 2017-01-24 DIAGNOSIS — B1920 Unspecified viral hepatitis C without hepatic coma: Secondary | ICD-10-CM | POA: Diagnosis not present

## 2017-05-09 ENCOUNTER — Other Ambulatory Visit: Payer: Self-pay | Admitting: Family Medicine

## 2017-05-24 ENCOUNTER — Other Ambulatory Visit: Payer: Self-pay

## 2017-05-24 DIAGNOSIS — I10 Essential (primary) hypertension: Secondary | ICD-10-CM

## 2017-05-24 MED ORDER — LOSARTAN POTASSIUM-HCTZ 100-25 MG PO TABS: 1.0000 | ORAL_TABLET | Freq: Every day | ORAL | 0 refills | Status: DC

## 2017-05-24 NOTE — Telephone Encounter (Signed)
Patient was last seen 05/30/2016 and per note he was to f/u in 4 months with BP logs. Patient had not scheduled appt until 05/31/2017. Will send in 1 week supply to maintain bp medication and level. He will need a 90 day supply following this refill when he arrives for his appt.

## 2017-05-31 ENCOUNTER — Ambulatory Visit (INDEPENDENT_AMBULATORY_CARE_PROVIDER_SITE_OTHER): Payer: Self-pay | Admitting: Family Medicine

## 2017-05-31 ENCOUNTER — Encounter: Payer: Self-pay | Admitting: Family Medicine

## 2017-05-31 VITALS — BP 115/85 | HR 80 | Ht 67.0 in | Wt 190.3 lb

## 2017-05-31 DIAGNOSIS — K219 Gastro-esophageal reflux disease without esophagitis: Secondary | ICD-10-CM

## 2017-05-31 DIAGNOSIS — I10 Essential (primary) hypertension: Secondary | ICD-10-CM | POA: Diagnosis not present

## 2017-05-31 DIAGNOSIS — N138 Other obstructive and reflux uropathy: Secondary | ICD-10-CM

## 2017-05-31 DIAGNOSIS — E663 Overweight: Secondary | ICD-10-CM

## 2017-05-31 DIAGNOSIS — E785 Hyperlipidemia, unspecified: Secondary | ICD-10-CM | POA: Insufficient documentation

## 2017-05-31 DIAGNOSIS — B182 Chronic viral hepatitis C: Secondary | ICD-10-CM

## 2017-05-31 DIAGNOSIS — R5383 Other fatigue: Secondary | ICD-10-CM | POA: Diagnosis not present

## 2017-05-31 DIAGNOSIS — M5136 Other intervertebral disc degeneration, lumbar region: Secondary | ICD-10-CM

## 2017-05-31 DIAGNOSIS — N401 Enlarged prostate with lower urinary tract symptoms: Secondary | ICD-10-CM

## 2017-05-31 DIAGNOSIS — E639 Nutritional deficiency, unspecified: Secondary | ICD-10-CM

## 2017-05-31 DIAGNOSIS — E786 Lipoprotein deficiency: Secondary | ICD-10-CM | POA: Insufficient documentation

## 2017-05-31 DIAGNOSIS — Z87891 Personal history of nicotine dependence: Secondary | ICD-10-CM

## 2017-05-31 DIAGNOSIS — E7800 Pure hypercholesterolemia, unspecified: Secondary | ICD-10-CM | POA: Diagnosis not present

## 2017-05-31 DIAGNOSIS — Z723 Lack of physical exercise: Secondary | ICD-10-CM

## 2017-05-31 DIAGNOSIS — Z9111 Patient's noncompliance with dietary regimen: Secondary | ICD-10-CM

## 2017-05-31 DIAGNOSIS — E559 Vitamin D deficiency, unspecified: Secondary | ICD-10-CM | POA: Insufficient documentation

## 2017-05-31 DIAGNOSIS — Z9114 Patient's other noncompliance with medication regimen: Secondary | ICD-10-CM

## 2017-05-31 MED ORDER — VITAMIN D3 125 MCG (5000 UT) PO TABS: ORAL_TABLET | ORAL | 3 refills | Status: DC

## 2017-05-31 MED ORDER — OMEPRAZOLE 20 MG PO CPDR: 20.0000 mg | DELAYED_RELEASE_CAPSULE | Freq: Every day | ORAL | 0 refills | Status: DC

## 2017-05-31 MED ORDER — LOSARTAN POTASSIUM-HCTZ 100-25 MG PO TABS: 1.0000 | ORAL_TABLET | Freq: Every day | ORAL | 0 refills | Status: DC

## 2017-05-31 MED ORDER — RANITIDINE HCL 300 MG PO TABS: 150.0000 mg | ORAL_TABLET | Freq: Two times a day (BID) | ORAL | 0 refills | Status: DC

## 2017-05-31 MED ORDER — VITAMIN D (ERGOCALCIFEROL) 1.25 MG (50000 UNIT) PO CAPS: 50000.0000 [IU] | ORAL_CAPSULE | ORAL | 1 refills | Status: DC

## 2017-05-31 NOTE — Progress Notes (Signed)
Impression and Recommendations:    1. Essential hypertension   2. Chronic hepatitis C without hepatic coma ([place])   3. Overweight (BMI 25.0-29.9)   4. History of tobacco use   5. Poor diet   6. Inactivity- not working out and no cardio   7. BPH with obstruction/lower urinary tract symptoms   8. Gastroesophageal reflux disease, esophagitis presence not specified   9. Vitamin D deficiency   10. Fatigue, unspecified type   11. Low HDL (under 40)   12. Elevated LDL cholesterol level   13. Noncompliance with diet and medication regimen   14. DDD (degenerative disc disease), lumbar     1. HTN: Pt instructed to continue taking his BP regularly. Medications are well-tolerated. Medications will be refilled at this moment. Pt instructed that prescriptions will not be refilled unless he visits the office for a follow up. 2. Overweight: Pt instructed to continue his diet of eating less junk food and drinks.  3. History of tobacco use: continue not using tobacco. Did mention he is using marijuana regularly 4. Inactivity: Pt recommended to exercise regularly regardless of physical activity he may do while at work. Handouts provided. 5. BPH: Follow up with specialist doctors for management of this disease. 6. GERD: Pt declined zantac OTC prescription. Will prescribe omeprazole and ranitidine scripts to pt today to treat his acid reflux. Handouts provided.  -  Vitamin D deficiency: Pt tolerates medications and supplements well. Instructed to continue taking these supplements (5000 IU). Will refill prescription for this.  We will add daily vitamin D as his last checked over a year ago was only 8.7.  Will check levels today.  -Fatigue: labs ordered today. See labs below.  - HDL: Follow diet and exercise guidelines. Will obtain fasting and lipid profile today to monitor these levels.  -Elevated LDL: Follow diet and exercise guidelines. Will obtain fasting and lipid profile today to monitor these  levels.  -Follow-up in one month to recheck and review labs and ensure medications are well-tolerated.  -Pt instructed to come back to the office regularly for follow-ups in order to better manage health and wellness.    - did discuss noncompliance issues with patient.    Education and routine counseling performed. Handouts provided.  Orders Placed This Encounter  Procedures  . CBC with Differential/Platelet  . Comprehensive metabolic panel  . Hemoglobin A1c  . Lipid panel  . VITAMIN D 25 Hydroxy (Vit-D Deficiency, Fractures)  . TSH  . T4, free  . Vitamin B12  . Phosphorus  . Magnesium    Meds ordered this encounter  Medications  . Cholecalciferol (VITAMIN D3) 5000 units TABS    Sig: 5,000 IU OTC vitamin D3 daily.    Dispense:  90 tablet    Refill:  3  . ranitidine (ZANTAC) 300 MG tablet    Sig: Take 0.5 tablets (150 mg total) by mouth 2 (two) times daily.    Dispense:  180 tablet    Refill:  0  . omeprazole (PRILOSEC) 20 MG capsule    Sig: Take 1 capsule (20 mg total) by mouth daily.    Dispense:  90 capsule    Refill:  0  . Vitamin D, Ergocalciferol, (DRISDOL) 50000 units CAPS capsule    Sig: Take 1 capsule (50,000 Units total) by mouth every 7 (seven) days.    Dispense:  12 capsule    Refill:  1  . losartan-hydrochlorothiazide (HYZAAR) 100-25 MG tablet    Sig: Take  1 tablet by mouth daily.    Dispense:  90 tablet    Refill:  0    Please keep appt for refills on 90 day supply     Return in about 4 weeks (around 06/28/2017) for Recheck since restarting blood pressure, reflux medicines, and review labs.  The patient was counseled, risk factors were discussed, anticipatory guidance given.  Gross side effects, risk and benefits, and alternatives of medications discussed with patient.  Patient is aware that all medications have potential side effects and we are unable to predict every side effect or drug-drug interaction that may occur.  Expresses verbal  understanding and consents to current therapy plan and treatment regimen.  Please see AVS handed out to patient at the end of our visit for further patient instructions/ counseling done pertaining to today's office visit.    Note: This document was prepared using Dragon voice recognition software and may include unintentional dictation errors.   This document serves as a record of services personally performed by Mellody Dance, DO. It was created on her behalf by Mayer Masker, a trained medical scribe. The creation of this record is based on the scribe's personal observations and the provider's statements to them.   I have reviewed medical documentation per the transcriptionist for accuracy and concur.  Mellody Dance 06/02/17 2:13 PM   Subjective:    HPI: Douglas Barnes is a 62 y.o. male who presents to [place] [facility] today for follow up for HTN. Pt was instructed to follow up on 09-29-16 but he did not come in to the office for this visit. His last visit was in May 30, 2016. He states he called and asked for additional medications and was given a prescription on 09-11-16. Pt has a PMHx of enlarged prostate and sees his doctors for regular physical examinations for this issue.    HTN:  -  His blood pressure has been controlled at home. He states he checked his BP for 90 days and he states they were "normal". He does not recall his exact BP numbers.  - Patient reports being compliant with blood pressure medications: HCTZ and losartan daily.  - Denies medication S-E   - Smoking Status noted.  - He denies new onset of: chest pain, exercise intolerance, shortness of breath, dizziness, visual changes, headache, lower extremity swelling or claudication.    Last 3 blood pressure readings in our office are as follows: BP Readings from Last 3 Encounters:  05/31/17 115/85  05/30/16 122/84  02/23/16 116/75    Pulse Readings from Last 3 Encounters:    05/31/17 80  05/30/16 69  02/23/16 82   Diet: Pt reports decreasing his coca cola and junk food intake. He says he switched to [employer].   Exercise: Pt states he has a very physical job and does work around [employer] but declines other physical activity.   Vitamin D: Pt states he feels rejuvenated after taking his vitamin D supplements and will continue taking it.  Reflux: Pt states his Ranitidine (BID) is not working well. He has had acid reflux for 3 years. He states he does not drink alcohol or smoke cigarettes anymore. He states it may be triggered from his coffee and soda intake.   Filed Weights   05/31/17 1031  Weight: 190 lb 4.8 oz (86.3 kg)      Patient Care Team    Relationship Specialty Notifications Start End  Mellody Dance, DO PCP - General  Family Medicine  02/23/16   Arta Silence, MD Consulting Physician Gastroenterology  02/23/16   Danella Sensing, MD Consulting Physician Dermatology  02/23/16   Festus Aloe, MD Consulting Physician Urology  02/23/16   Sharlet Salina, MD Referring Physician Physical Medicine and Rehabilitation  02/23/16    Comment: lower back/ transformainal injections     Lab Results  Component Value Date   CREATININE 0.99 05/31/2017   BUN 14 05/31/2017   NA 141 05/31/2017   K 4.5 05/31/2017   CL 100 05/31/2017   CO2 25 05/31/2017    Lab Results  Component Value Date   CHOL 169 05/31/2017   CHOL 176 05/30/2016    Lab Results  Component Value Date   HDL 35 (L) 05/31/2017   HDL 36 (L) 05/30/2016    Lab Results  Component Value Date   LDLCALC 114 (H) 05/31/2017   LDLCALC 113 (H) 05/30/2016    Lab Results  Component Value Date   TRIG 100 05/31/2017   TRIG 134 05/30/2016    Lab Results  Component Value Date   CHOLHDL 4.8 05/31/2017   CHOLHDL 4.9 05/30/2016    No results found for: LDLDIRECT ===================================================================   Patient Active Problem List   Diagnosis Date  Noted  . Elevated LDL cholesterol level 05/31/2017    Priority: High  . Low HDL (under 40) 05/31/2017    Priority: High  . Overweight (BMI 25.0-29.9) 05/30/2016    Priority: High  . HTN (hypertension) 02/23/2016    Priority: High  . GERD (gastroesophageal reflux disease) 05/31/2017    Priority: Medium  . Fatigue 05/31/2017    Priority: Medium  . Hepatitis C infection- s/p harvoni 02/23/2016    Priority: Medium  . History of colonic polyps 02/23/2016    Priority: Medium  . 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 Vitamin D deficiency 05/31/2017    Priority: Low  . Poor diet 05/30/2016    Priority: Low  . Inactivity- not working out and no cardio 05/30/2016    Priority: Low  . History of tobacco use 02/23/2016    Priority: Low  . Hematuria- chronic (followed by Dr Junious Silk urology) 07/11/2015    Priority: Low  . Noncompliance with diet and medication regimen 05/31/2017  . DDD (degenerative disc disease), lumbar 12/01/2013  . Lumbar radiculitis 12/01/2013     Past Medical History:  Diagnosis Date  . BPH (benign prostatic hypertrophy) with urinary retention   . Complication of anesthesia    "fear of not waking up"  . Foley catheter in place   . Hepatitis C antibody test positive    dx 1997--  pt is getting treatment in near future (last enzymes 12 /2016 stable per pt and in epic)  . History of colon polyps   . History of hypertension    no medication since 2014 lost weight and decrease stress  . Hypertension      Past Surgical History:  Procedure Laterality Date  . ANAL FISSURE REPAIR    . COLONOSCOPY WITH PROPOFOL  last one 2015  . GREEN LIGHT LASER TURP (TRANSURETHRAL RESECTION OF PROSTATE N/A 07/09/2015   Procedure: GREEN LIGHT LASER TURP (TRANSURETHRAL RESECTION OF PROSTATE;  Surgeon: Festus Aloe, MD;  Location: [facility];  Service: Urology;  Laterality: N/A;  . INGUINAL HERNIA REPAIR  Right age 24  . TRANSURETHRAL RESECTION OF PROSTATE N/A 07/09/2015   Procedure: CYSTOSCOPY WITH CLOT EVACUATION //FULGERATION OF BLEEDERS;  Surgeon:  Alexis Frock, MD;  Location: [facility];  Service: Urology;  Laterality: N/A;     Family History  Problem Relation Age of Onset  . Cancer Mother 29       BONE  . Cancer Father 64       COLON  . Hypertension Brother      Social History   Substance and Sexual Activity  Drug Use No  ,  Social History   Substance and Sexual Activity  Alcohol Use No  ,  Social History   Tobacco Use  Smoking Status Former Smoker  . Packs/day: 0.00  . Years: 25.00  . Pack years: 0.00  . Types: Cigarettes  Smokeless Tobacco Never Used  Tobacco Comment   5 CIG. PER DAY  ,    No current outpatient medications on file prior to visit.   No current facility-administered medications on file prior to visit.      No Known Allergies   Review of Systems:   General:  Denies fever, chills Optho/Auditory:   Denies visual changes, blurred vision Respiratory:   Denies SOB, cough, wheeze, DIB  Cardiovascular:   Denies chest pain, palpitations, painful respirations Gastrointestinal:   Denies nausea, vomiting, diarrhea.  Endocrine:     Denies new hot or cold intolerance Musculoskeletal:  Denies joint swelling, gait issues, or new unexplained myalgias/ arthralgias Skin:  Denies rash, suspicious lesions  Neurological:    Denies dizziness, unexplained weakness, numbness  Psychiatric/Behavioral:   Denies mood changes  Objective:    Blood pressure 115/85, pulse 80, height 5\' 7"  (1.702 m), weight 190 lb 4.8 oz (86.3 kg), SpO2 98 %.  Body mass index is 29.81 kg/m.  General: Well Developed, well nourished, and in no acute distress.  HEENT: Normocephalic, atraumatic, pupils equal round reactive to light, neck supple, No carotid bruits, no JVD Skin: Warm and dry, cap RF less 2 sec Cardiac: Regular rate and rhythm, S1, S2 WNL's, no murmurs rubs or  gallops Respiratory: ECTA B/L, Not using accessory muscles, speaking in full sentences. NeuroM-Sk: Ambulates w/o assistance, moves ext * 4 w/o difficulty, sensation grossly intact.  Ext: scant edema b/l lower ext Psych: No HI/SI, judgement and insight good, Euthymic mood. Full Affect.

## 2017-05-31 NOTE — Patient Instructions (Signed)
Guidelines for a Low Cholesterol, Low Saturated Fat Diet   Fats - Limit total intake of fats and oils. - Avoid butter, stick margarine, shortening, lard, palm and coconut oils. - Limit mayonnaise, salad dressings, gravies and sauces, unless they are homemade with low-fat ingredients. - Limit chocolate. - Choose low-fat and nonfat products, such as low-fat mayonnaise, low-fat or non-hydrogenated peanut butter, low-fat or fat-free salad dressings and nonfat gravy. - Use vegetable oil, such as canola or olive oil. - Look for margarine that does not contain trans fatty acids. - Use nuts in moderate amounts. - Read ingredient labels carefully to determine both amount and type of fat present in foods. Limit saturated and trans fats! - Avoid high-fat processed and convenience foods.  Meats and Meat Alternatives - Choose fish, chicken, [place] and lean meats. - Use dried beans, peas, lentils and tofu. - Limit egg yolks to three to four per week. - If you eat red meat, limit to no more than three servings per week and choose loin or round cuts. - Avoid fatty meats, such as bacon, sausage, franks, luncheon meats and ribs. - Avoid all organ meats, including liver.  Dairy - Choose nonfat or low-fat milk, yogurt and cottage cheese. - Most cheeses are high in fat. Choose cheeses made from non-fat milk, such as mozzarella and ricotta cheese. - Choose light or fat-free cream cheese and sour cream. - Avoid cream and sauces made with cream.  Fruits and Vegetables - Eat a wide variety of fruits and vegetables. - Use lemon juice, vinegar or "mist" olive oil on vegetables. - Avoid adding sauces, fat or oil to vegetables.  Breads, Cereals and Grains - Choose whole-grain breads, cereals, pastas and rice. - Avoid high-fat snack foods, such as granola, cookies, pies, pastries, doughnuts and croissants.  Cooking Tips - Avoid deep fried foods. - Trim visible fat off meats and remove skin from poultry  before cooking. - Bake, broil, boil, poach or roast poultry, fish and lean meats. - Drain and discard fat that drains out of meat as you cook it. - Add little or no fat to foods. - Use vegetable oil sprays to grease pans for cooking or baking. - Steam vegetables. - Use herbs or no-oil marinades to flavor foods.  Food Choices for Gastroesophageal Reflux Disease, Adult When you have gastroesophageal reflux disease (GERD), the foods you eat and your eating habits are very important. Choosing the right foods can help ease your discomfort. What guidelines do I need to follow?  Choose fruits, vegetables, whole grains, and low-fat dairy products.  Choose low-fat meat, fish, and poultry.  Limit fats such as oils, salad dressings, butter, nuts, and avocado.  Keep a food diary. This helps you identify foods that cause symptoms.  Avoid foods that cause symptoms. These may be different for everyone.  Eat small meals often instead of 3 large meals a day.  Eat your meals slowly, in a place where you are relaxed.  Limit fried foods.  Cook foods using methods other than frying.  Avoid drinking alcohol.  Avoid drinking large amounts of liquids with your meals.  Avoid bending over or lying down until 2-3 hours after eating. What foods are not recommended? These are some foods and drinks that may make your symptoms worse: Vegetables Tomatoes. Tomato juice. Tomato and spaghetti sauce. Chili peppers. Onion and garlic. Horseradish. Fruits Oranges, grapefruit, and lemon (fruit and juice). Meats High-fat meats, fish, and poultry. This includes hot dogs, ribs, ham, sausage, salami, and  bacon. Dairy Whole milk and chocolate milk. Sour cream. Cream. Butter. Ice cream. Cream cheese. Drinks Coffee and tea. Bubbly (carbonated) drinks or energy drinks. Condiments Hot sauce. Barbecue sauce. Sweets/Desserts Chocolate and cocoa. Donuts. Peppermint and spearmint. Fats and Oils High-fat foods. This  includes [place] fries and potato chips. Other Vinegar. Strong spices. This includes black pepper, white pepper, red pepper, cayenne, curry powder, cloves, ginger, and chili powder. The items listed above may not be a complete list of foods and drinks to avoid. Contact your dietitian for more information. This information is not intended to replace advice given to you by your health care provider. Make sure you discuss any questions you have with your health care provider. Document Released: 11/14/2011 Document Revised: 10/21/2015 Document Reviewed: 03/19/2013 Elsevier Interactive Patient Education  2017 [employer].

## 2017-06-01 LAB — COMPREHENSIVE METABOLIC PANEL
ALBUMIN: 4.6 g/dL (ref 3.5–5.5)
ALK PHOS: 63 IU/L (ref 39–117)
ALT: 14 IU/L (ref 0–44)
AST: 15 IU/L (ref 0–40)
Albumin/Globulin Ratio: 1.4 (ref 1.2–2.2)
BUN / CREAT RATIO: 14 (ref 9–20)
BUN: 14 mg/dL (ref 6–24)
Bilirubin Total: 1 mg/dL (ref 0.0–1.2)
CO2: 25 mmol/L (ref 20–29)
Calcium: 9.8 mg/dL (ref 8.7–10.2)
Chloride: 100 mmol/L (ref 96–106)
Creatinine, Ser: 0.99 mg/dL (ref 0.76–1.27)
GFR calc Af Amer: 96 mL/min/{1.73_m2} (ref >59)
GFR calc non Af Amer: 83 mL/min/{1.73_m2} (ref >59)
GLOBULIN, TOTAL: 3.2 g/dL (ref 1.5–4.5)
Glucose: 109 mg/dL — ABNORMAL HIGH (ref 65–99)
POTASSIUM: 4.5 mmol/L (ref 3.5–5.2)
SODIUM: 141 mmol/L (ref 134–144)
Total Protein: 7.8 g/dL (ref 6.0–8.5)

## 2017-06-01 LAB — CBC WITH DIFFERENTIAL/PLATELET
BASOS ABS: 0 10*3/uL (ref 0.0–0.2)
Basos: 0 %
EOS (ABSOLUTE): 0.2 10*3/uL (ref 0.0–0.4)
Eos: 2 %
HEMOGLOBIN: 16.5 g/dL (ref 13.0–17.7)
Hematocrit: 46.1 % (ref 37.5–51.0)
Immature Grans (Abs): 0 10*3/uL (ref 0.0–0.1)
Immature Granulocytes: 0 %
LYMPHS ABS: 1.6 10*3/uL (ref 0.7–3.1)
Lymphs: 21 %
MCH: 31.6 pg (ref 26.6–33.0)
MCHC: 35.8 g/dL — AB (ref 31.5–35.7)
MCV: 88 fL (ref 79–97)
MONOCYTES: 8 %
MONOS ABS: 0.6 10*3/uL (ref 0.1–0.9)
Neutrophils Absolute: 5.5 10*3/uL (ref 1.4–7.0)
Neutrophils: 69 %
Platelets: 259 10*3/uL (ref 150–379)
RBC: 5.22 x10E6/uL (ref 4.14–5.80)
RDW: 12.7 % (ref 12.3–15.4)
WBC: 8 10*3/uL (ref 3.4–10.8)

## 2017-06-01 LAB — T4, FREE: Free T4: 1.27 ng/dL (ref 0.82–1.77)

## 2017-06-01 LAB — LIPID PANEL
CHOL/HDL RATIO: 4.8 ratio (ref 0.0–5.0)
Cholesterol, Total: 169 mg/dL (ref 100–199)
HDL: 35 mg/dL — ABNORMAL LOW (ref >39)
LDL Calculated: 114 mg/dL — ABNORMAL HIGH (ref 0–99)
Triglycerides: 100 mg/dL (ref 0–149)
VLDL Cholesterol Cal: 20 mg/dL (ref 5–40)

## 2017-06-01 LAB — MAGNESIUM: MAGNESIUM: 2.1 mg/dL (ref 1.6–2.3)

## 2017-06-01 LAB — HEMOGLOBIN A1C
Est. average glucose Bld gHb Est-mCnc: 123 mg/dL
HEMOGLOBIN A1C: 5.9 % — AB (ref 4.8–5.6)

## 2017-06-01 LAB — TSH: TSH: 2.4 u[IU]/mL (ref 0.450–4.500)

## 2017-06-01 LAB — PHOSPHORUS: PHOSPHORUS: 2.9 mg/dL (ref 2.5–4.5)

## 2017-06-01 LAB — VITAMIN B12: Vitamin B-12: 367 pg/mL (ref 232–1245)

## 2017-06-01 LAB — VITAMIN D 25 HYDROXY (VIT D DEFICIENCY, FRACTURES): VIT D 25 HYDROXY: 40.4 ng/mL (ref 30.0–100.0)

## 2017-06-28 ENCOUNTER — Encounter: Payer: Self-pay | Admitting: Family Medicine

## 2017-06-28 ENCOUNTER — Ambulatory Visit (INDEPENDENT_AMBULATORY_CARE_PROVIDER_SITE_OTHER): Payer: BLUE CROSS/BLUE SHIELD | Admitting: Family Medicine

## 2017-06-28 VITALS — BP 119/81 | HR 84 | Ht 67.0 in | Wt 189.9 lb

## 2017-06-28 DIAGNOSIS — I10 Essential (primary) hypertension: Secondary | ICD-10-CM | POA: Diagnosis not present

## 2017-06-28 DIAGNOSIS — E559 Vitamin D deficiency, unspecified: Secondary | ICD-10-CM | POA: Diagnosis not present

## 2017-06-28 DIAGNOSIS — R7302 Impaired glucose tolerance (oral): Secondary | ICD-10-CM

## 2017-06-28 DIAGNOSIS — E786 Lipoprotein deficiency: Secondary | ICD-10-CM | POA: Diagnosis not present

## 2017-06-28 DIAGNOSIS — K219 Gastro-esophageal reflux disease without esophagitis: Secondary | ICD-10-CM | POA: Diagnosis not present

## 2017-06-28 DIAGNOSIS — E7800 Pure hypercholesterolemia, unspecified: Secondary | ICD-10-CM

## 2017-06-28 NOTE — Patient Instructions (Addendum)
Your 10-year atherosclerotic cardiovascular risk was 9.8%.  This is too high.  We will recheck in 6 months since you would like to try diet and lifestyle modification first, and if your risk is not under 7.5% at that time you will go on statin medications  Please take a once a day men's Centrum silver that has B vitamins in it.  -6 months to recheck A1c, fasting lipid profile\cholesterol, vitamin D, and B12 when you are taking  -Additional information can be found on the American Heart Association website regarding cholesterol and low-cholesterol diets  Guidelines for a Low Cholesterol, Low Saturated Fat Diet  Fats - Limit total intake of fats and oils. - Avoid butter, stick margarine, shortening, lard, palm and coconut oils. - Limit mayonnaise, salad dressings, gravies and sauces, unless they are homemade with low-fat ingredients. - Limit chocolate. - Choose low-fat and nonfat products, such as low-fat mayonnaise, low-fat or non-hydrogenated peanut butter, low-fat or fat-free salad dressings and nonfat gravy. - Use vegetable oil, such as canola or olive oil. - Look for margarine that does not contain trans fatty acids. - Use nuts in moderate amounts. - Read ingredient labels carefully to determine both amount and type of fat present in foods. Limit saturated and trans fats! - Avoid high-fat processed and convenience foods.  Meats and Meat Alternatives - Choose fish, chicken, [place] and lean meats. - Use dried beans, peas, lentils and tofu. - Limit egg yolks to three to four per week. - If you eat red meat, limit to no more than three servings per week and choose loin or round cuts. - Avoid fatty meats, such as bacon, sausage, franks, luncheon meats and ribs. - Avoid all organ meats, including liver.  Dairy - Choose nonfat or low-fat milk, yogurt and cottage cheese. - Most cheeses are high in fat. Choose cheeses made from non-fat milk, such as mozzarella and ricotta cheese. - Choose  light or fat-free cream cheese and sour cream. - Avoid cream and sauces made with cream.  Fruits and Vegetables - Eat a wide variety of fruits and vegetables. - Use lemon juice, vinegar or "mist" olive oil on vegetables. - Avoid adding sauces, fat or oil to vegetables.  Breads, Cereals and Grains - Choose whole-grain breads, cereals, pastas and rice. - Avoid high-fat snack foods, such as granola, cookies, pies, pastries, doughnuts and croissants.  Cooking Tips - Avoid deep fried foods. - Trim visible fat off meats and remove skin from poultry before cooking. - Bake, broil, boil, poach or roast poultry, fish and lean meats. - Drain and discard fat that drains out of meat as you cook it. - Add little or no fat to foods. - Use vegetable oil sprays to grease pans for cooking or baking. - Steam vegetables. - Use herbs or no-oil marinades to flavor foods.

## 2017-06-28 NOTE — Progress Notes (Signed)
Assessment and plan:  1. Glucose intolerance (impaired glucose tolerance)   2. Essential hypertension   3. Gastroesophageal reflux disease, esophagitis presence not specified   4. Low HDL (under 40)   5. Elevated LDL cholesterol level   6. Vitamin D deficiency    1. Glucose intolerance- Dietary and exercise guidelines discussed with pt. Recommended to reduce intake of saturated, trans fats and fatty carbohydrates. Reduce carbohydrates and sugary foods and drinks. Handouts provided if desired. Recheck in 6 months.   2. Essential Hypertension---Pt is compliant with his medication and tolerating them well. Continue taking meds as prescribed.  -Continue checking your BP at home if you have any symptoms.  3. GERD- Continue taking your meds as he is tolerating them well.  4. Low HDL- Exercise and dietary guidelines discussed with pt. Encouraged to start exercising daily including swimming, walking on a treadmill or using an eliptical, and other daily activities that are not limited by your chronic back pain.    5. Elevated LDL cholesterol level- Dietary and exercise guidelines discussed with patient. Recommended pt to reduce intake of saturated, trans fats and fatty carbohydrates. Handouts provided if desired. ASCVD ten year risk is 9.8%. -Recheck in 6 months - discussed with pt to start dietary and lifestyle modification to reduce ASCVD ten year risk to be under 7.5% to which he agreed. We will start meds if he is not under 7.5%.   6. Vitamin D deficiency- continue taking supplements as listed below.  -Take once a day Men's Centrum Silver for B vitamin supplements.  -Discussed with pt his chronic back pain and to keep a good relationship with his orthopedist, Dr. Sharlet Salina.   -Recheck a1c, fasting lipid profile/cholesterol, vitamin D, and B12 in 6 months.  -Follow up in near future for complete physical exam, or 6 months for  management of chronic care.   Education and routine counseling performed. Handouts provided.  No orders of the defined types were placed in this encounter.   No orders of the defined types were placed in this encounter.    Return for Near future for complete yearly physical otherwise 6 months  ( blwrk prior) .   Anticipatory guidance and routine counseling done re: condition, txmnt options and need for follow up. All questions of patient's were answered.   Gross side effects, risk and benefits, and alternatives of medications discussed with patient.  Patient is aware that all medications have potential side effects and we are unable to predict every sideeffect or drug-drug interaction that may occur.  Expresses verbal understanding and consents to current therapy plan and treatment regiment.  Please see AVS handed out to patient at the end of our visit for additional patient instructions/ counseling done pertaining to today's office visit.  Note: This document was prepared using Dragon voice recognition software and may include unintentional dictation errors.  This document serves as a record of services personally performed by Mellody Dance, DO. It was created on her behalf by Mayer Masker, a trained medical scribe. The creation of this record is based on the scribe's personal observations and the provider's statements to them.   I have reviewed the above medical documentation for accuracy and completeness and I concur.  Mellody Dance 07/09/17 7:00 PM  ----------------------------------------------------------------------------------------------------------------------  Subjective:   CC:   Douglas Barnes is a 62 y.o. male who presents to [place] at [facility] today for review and discussion of recent bloodwork that was  done, as well as HTN management.  1. All recent blood work that we ordered was reviewed with patient today.  Patient was counseled on  all abnormalities and we discussed dietary and lifestyle changes that could help those values (also medications when appropriate).  Extensive health counseling performed and all patient's concerns/ questions were addressed.    HTN: He does not check his BP at home unless he feels poorly. He otherwise feels well. He is compliant with his medication.   Chronic pain He states he has chronic back pain from his work. He stopped seeing Dr. Sharlet Salina because his steroids made him have "roid rage" and changed his temper. He is very adamant about not taking prescription pain medications. He states he switches between taking ibuprofen and tylenol for pain.    Reflux: Pt is not taking omeprazole but is taking zantac 3x a day without complications. He states he went off omeprazole to see how he could manage. He is fine not taking omeprazole.   Vitamin D- he is compliant with his daily and weekly supplementals   Pt stopped smoking 2 years ago. He is very adamant that he does not want to start any new daily medications for his chronic conditions.  Wt Readings from Last 3 Encounters:  06/28/17 189 lb 14.4 oz (86.1 kg)  05/31/17 190 lb 4.8 oz (86.3 kg)  05/30/16 192 lb 9.6 oz (87.4 kg)   BP Readings from Last 3 Encounters:  06/28/17 119/81  05/31/17 115/85  05/30/16 122/84   Pulse Readings from Last 3 Encounters:  06/28/17 84  05/31/17 80  05/30/16 69   BMI Readings from Last 3 Encounters:  06/28/17 29.74 kg/m  05/31/17 29.81 kg/m  05/30/16 30.17 kg/m     Patient Care Team    Relationship Specialty Notifications Start End  Mellody Dance, DO PCP - General Family Medicine  02/23/16   Arta Silence, MD Consulting Physician Gastroenterology  02/23/16   Danella Sensing, MD Consulting Physician Dermatology  02/23/16   Festus Aloe, MD Consulting Physician Urology  02/23/16   Sharlet Salina, MD Referring Physician Physical Medicine and Rehabilitation  02/23/16    Comment: lower back/  transformainal injections    Full medical history updated and reviewed in the office today  Patient Active Problem List   Diagnosis Date Noted  . Elevated LDL cholesterol level 05/31/2017    Priority: High  . Low HDL (under 40) 05/31/2017    Priority: High  . Overweight (BMI 25.0-29.9) 05/30/2016    Priority: High  . HTN (hypertension) 02/23/2016    Priority: High  . GERD (gastroesophageal reflux disease) 05/31/2017    Priority: Medium  . Fatigue 05/31/2017    Priority: Medium  . Hepatitis C infection- s/p harvoni 02/23/2016    Priority: Medium  . History of colonic polyps 02/23/2016    Priority: Medium  . 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 Vitamin D deficiency 05/31/2017    Priority: Low  . Poor diet 05/30/2016    Priority: Low  . Inactivity- not working out and no cardio 05/30/2016    Priority: Low  . History of tobacco use 02/23/2016    Priority: Low  . Hematuria- chronic (followed by Dr Junious Silk urology) 07/11/2015    Priority: Low  . Noncompliance with diet and medication regimen 05/31/2017  . DDD (degenerative disc disease), lumbar 12/01/2013  . Lumbar radiculitis 12/01/2013    Past Medical History:  Diagnosis Date  . BPH (benign  prostatic hypertrophy) with urinary retention   . Complication of anesthesia    "fear of not waking up"  . Foley catheter in place   . Hepatitis C antibody test positive    dx 1997--  pt is getting treatment in near future (last enzymes 12 /2016 stable per pt and in epic)  . History of colon polyps   . History of hypertension    no medication since 2014 lost weight and decrease stress  . Hypertension     Past Surgical History:  Procedure Laterality Date  . ANAL FISSURE REPAIR    . COLONOSCOPY WITH PROPOFOL  last one 2015  . GREEN LIGHT LASER TURP (TRANSURETHRAL RESECTION OF PROSTATE N/A 07/09/2015   Procedure: GREEN LIGHT LASER TURP (TRANSURETHRAL RESECTION OF PROSTATE;   Surgeon: Festus Aloe, MD;  Location: [facility];  Service: Urology;  Laterality: N/A;  . INGUINAL HERNIA REPAIR Right age 48  . TRANSURETHRAL RESECTION OF PROSTATE N/A 07/09/2015   Procedure: CYSTOSCOPY WITH CLOT EVACUATION //FULGERATION OF BLEEDERS;  Surgeon: Alexis Frock, MD;  Location: [facility];  Service: Urology;  Laterality: N/A;    Social History   Tobacco Use  . Smoking status: Former Smoker    Packs/day: 0.00    Years: 25.00    Pack years: 0.00    Types: Cigarettes  . Smokeless tobacco: Never Used  . Tobacco comment: 5 CIG. PER DAY  Substance Use Topics  . Alcohol use: No    Family Hx: Family History  Problem Relation Age of Onset  . Cancer Mother 32       BONE  . Cancer Father 60       COLON  . Hypertension Brother      Medications: Current Outpatient Medications  Medication Sig Dispense Refill  . Cholecalciferol (VITAMIN D3) 5000 units TABS 5,000 IU OTC vitamin D3 daily. 90 tablet 3  . losartan-hydrochlorothiazide (HYZAAR) 100-25 MG tablet Take 1 tablet by mouth daily. 90 tablet 0  . ranitidine (ZANTAC) 300 MG tablet Take 0.5 tablets (150 mg total) by mouth 2 (two) times daily. 180 tablet 0  . Vitamin D, Ergocalciferol, (DRISDOL) 50000 units CAPS capsule Take 1 capsule (50,000 Units total) by mouth every 7 (seven) days. 12 capsule 1  . omeprazole (PRILOSEC) 20 MG capsule Take 1 capsule (20 mg total) by mouth daily. 90 capsule 0   No current facility-administered medications for this visit.     Allergies:  No Known Allergies   Review of Systems: General:   No F/C, wt loss Pulm:   No DIB, SOB, pleuritic chest pain Card:  No CP, palpitations Abd:  No n/v/d or pain Ext:  No inc edema from baseline  Objective:  Blood pressure 119/81, pulse 84, height 5\' 7"  (1.702 m), weight 189 lb 14.4 oz (86.1 kg), SpO2 97 %. Body mass index is 29.74 kg/m. Gen:   Well NAD, A and O *3 HEENT:    [place]/AT, EOMI,  MMM Lungs:   Normal work of breathing.  CTA B/L, no Wh, rhonchi Heart:   RRR, S1, S2 WNL's, no MRG Abd:   No gross distention Exts:    warm, pink,  Brisk capillary refill, warm and well perfused.  Psych:    No HI/SI, judgement and insight good, Euthymic mood. Full Affect.   Recent Results (from the past 2160 hour(s))  CBC with Differential/Platelet     Status: Abnormal   Collection Time: 05/31/17 11:18 AM  Result Value Ref Range   WBC 8.0  3.4 - 10.8 x10E3/uL   RBC 5.22 4.14 - 5.80 x10E6/uL   Hemoglobin 16.5 13.0 - 17.7 g/dL   Hematocrit 46.1 37.5 - 51.0 %   MCV 88 79 - 97 fL   MCH 31.6 26.6 - 33.0 pg   MCHC 35.8 (H) 31.5 - 35.7 g/dL   RDW 12.7 12.3 - 15.4 %   Platelets 259 150 - 379 x10E3/uL   Neutrophils 69 Not Estab. %   Lymphs 21 Not Estab. %   Monocytes 8 Not Estab. %   Eos 2 Not Estab. %   Basos 0 Not Estab. %   Neutrophils Absolute 5.5 1.4 - 7.0 x10E3/uL   Lymphocytes Absolute 1.6 0.7 - 3.1 x10E3/uL   Monocytes Absolute 0.6 0.1 - 0.9 x10E3/uL   EOS (ABSOLUTE) 0.2 0.0 - 0.4 x10E3/uL   Basophils Absolute 0.0 0.0 - 0.2 x10E3/uL   Immature Granulocytes 0 Not Estab. %   Immature Grans (Abs) 0.0 0.0 - 0.1 x10E3/uL  Comprehensive metabolic panel     Status: Abnormal   Collection Time: 05/31/17 11:18 AM  Result Value Ref Range   Glucose 109 (H) 65 - 99 mg/dL   BUN 14 6 - 24 mg/dL   Creatinine, Ser 0.99 0.76 - 1.27 mg/dL   GFR calc non Af Amer 83 >59 mL/min/1.73   GFR calc Af Amer 96 >59 mL/min/1.73   BUN/Creatinine Ratio 14 9 - 20   Sodium 141 134 - 144 mmol/L   Potassium 4.5 3.5 - 5.2 mmol/L   Chloride 100 96 - 106 mmol/L   CO2 25 20 - 29 mmol/L   Calcium 9.8 8.7 - 10.2 mg/dL   Total Protein 7.8 6.0 - 8.5 g/dL   Albumin 4.6 3.5 - 5.5 g/dL   Globulin, Total 3.2 1.5 - 4.5 g/dL   Albumin/Globulin Ratio 1.4 1.2 - 2.2   Bilirubin Total 1.0 0.0 - 1.2 mg/dL   Alkaline Phosphatase 63 39 - 117 IU/L   AST 15 0 - 40 IU/L   ALT 14 0 - 44 IU/L  Hemoglobin A1c     Status: Abnormal   Collection Time: 05/31/17 11:18  AM  Result Value Ref Range   Hgb A1c MFr Bld 5.9 (H) 4.8 - 5.6 %    Comment:          Prediabetes: 5.7 - 6.4          Diabetes: >6.4          Glycemic control for adults with diabetes: <7.0    Est. average glucose Bld gHb Est-mCnc 123 mg/dL  Lipid panel     Status: Abnormal   Collection Time: 05/31/17 11:18 AM  Result Value Ref Range   Cholesterol, Total 169 100 - 199 mg/dL   Triglycerides 100 0 - 149 mg/dL   HDL 35 (L) >39 mg/dL   VLDL Cholesterol Cal 20 5 - 40 mg/dL   LDL Calculated 114 (H) 0 - 99 mg/dL   Chol/HDL Ratio 4.8 0.0 - 5.0 ratio    Comment:                                   T. Chol/HDL Ratio                                             Men  Women                               1/2 Avg.Risk  3.4    3.3                                   Avg.Risk  5.0    4.4                                2X Avg.Risk  9.6    7.1                                3X Avg.Risk 23.4   11.0   VITAMIN D 25 Hydroxy (Vit-D Deficiency, Fractures)     Status: None   Collection Time: 05/31/17 11:18 AM  Result Value Ref Range   Vit D, 25-Hydroxy 40.4 30.0 - 100.0 ng/mL    Comment: Vitamin D deficiency has been defined by the [place] practice guideline as a level of serum 25-OH vitamin D less than 20 ng/mL (1,2). The Endocrine Society went on to further define vitamin D insufficiency as a level between 21 and 29 ng/mL (2). 1. IOM (Institute of Medicine). 2010. Dietary reference    intakes for calcium and D. [place]: The    [employer]. 2. Holick MF, Binkley Enders, Bischoff-Ferrari HA, et al.    Evaluation, treatment, and prevention of vitamin D    deficiency: an Endocrine Society clinical practice    guideline. JCEM. 2011 Jul; 96(7):1911-30.   TSH     Status: None   Collection Time: 05/31/17 11:18 AM  Result Value Ref Range   TSH 2.400 0.450 - 4.500 uIU/mL  T4, free     Status: None   Collection Time: 05/31/17 11:18 AM  Result Value Ref  Range   Free T4 1.27 0.82 - 1.77 ng/dL  Vitamin B12     Status: None   Collection Time: 05/31/17 11:18 AM  Result Value Ref Range   Vitamin B-12 367 232 - 1,245 pg/mL  Phosphorus     Status: None   Collection Time: 05/31/17 11:18 AM  Result Value Ref Range   Phosphorus 2.9 2.5 - 4.5 mg/dL  Magnesium     Status: None   Collection Time: 05/31/17 11:18 AM  Result Value Ref Range   Magnesium 2.1 1.6 - 2.3 mg/dL

## 2017-08-24 ENCOUNTER — Other Ambulatory Visit: Payer: Self-pay | Admitting: Family Medicine

## 2017-08-24 DIAGNOSIS — I10 Essential (primary) hypertension: Secondary | ICD-10-CM

## 2017-08-29 ENCOUNTER — Other Ambulatory Visit: Payer: Self-pay | Admitting: Family Medicine

## 2017-08-29 DIAGNOSIS — K219 Gastro-esophageal reflux disease without esophagitis: Secondary | ICD-10-CM

## 2017-09-18 DIAGNOSIS — R310 Gross hematuria: Secondary | ICD-10-CM | POA: Diagnosis not present

## 2017-09-18 DIAGNOSIS — R3912 Poor urinary stream: Secondary | ICD-10-CM | POA: Diagnosis not present

## 2017-09-18 DIAGNOSIS — R972 Elevated prostate specific antigen [PSA]: Secondary | ICD-10-CM | POA: Diagnosis not present

## 2017-09-20 ENCOUNTER — Encounter: Payer: Self-pay | Admitting: Adult Health

## 2017-09-20 ENCOUNTER — Ambulatory Visit: Payer: BLUE CROSS/BLUE SHIELD | Admitting: Adult Health

## 2017-09-20 VITALS — BP 114/78 | HR 96 | Temp 98.5°F | Ht 67.0 in | Wt 191.8 lb

## 2017-09-20 DIAGNOSIS — R221 Localized swelling, mass and lump, neck: Secondary | ICD-10-CM

## 2017-09-20 NOTE — Patient Instructions (Addendum)
  Your vital signs and examination are stable,. Increase fluids. Please take OTC Antihistamines, ie Claritin. If swelling increase or you develop wheezing, difficulty breathing, throat swelling, rash on chest- seek immediate medical attention. If swelling is still present in 1 week, call clinic and we will send you for ultrasound of soft tissue. FEEL BETTER!

## 2017-09-20 NOTE — Assessment & Plan Note (Signed)
Vital signs and examination are stable No evidence of infection or anaphylactic reaction Increase fluids. Please take OTC Antihistamines, ie Claritin. If swelling increase or you develop wheezing, difficulty breathing, throat swelling, rash on chest- seek immediate medical attention- pt verbalized understanding/agreement. If swelling is still present in 1 week, call clinic and we will send you for ultrasound of soft tissue.

## 2017-09-20 NOTE — Progress Notes (Signed)
Subjective:    Patient ID: Douglas Barnes, male    DOB: Mar 31, 1956, 62 y.o.   MRN: [id number]  HPI :  [name] presents with "side of L neck/face swelling" that began suddenly today at 1400 when he was eating a mediterranean salad of various vegetables and chicken, he also had french fries.  He has self treated with L ear peroxide and OTC decongestant. He denies fever/N/V/D/wheezing/sore throat. He denies recent URI sx's He denies N/V/D/CP/palpitations. He denies known food allergies. He reports similar occurrence at age 62 when he was eating meatloaf and mashed potates. He sought medical care and his L ear was flushed and a large amount of cerumen was removed and swelling immediately subsided. He also has had salivary stone that caused swelling/pain underneath R chin. He is flying out to [place] and will return next week.  Patient Care Team    Relationship Specialty Notifications Start End  Mellody Dance, DO PCP - General Family Medicine  02/23/16   Arta Silence, MD Consulting Physician Gastroenterology  02/23/16   Danella Sensing, MD Consulting Physician Dermatology  02/23/16   Festus Aloe, MD Consulting Physician Urology  02/23/16   Sharlet Salina, MD Referring Physician Physical Medicine and Rehabilitation  02/23/16    Comment: lower back/ transformainal injections    Patient Active Problem List   Diagnosis Date Noted  . Localized swelling, mass or lump of neck 09/20/2017  . GERD (gastroesophageal reflux disease) 05/31/2017  . Elevated LDL cholesterol level 05/31/2017  . Low HDL (under 40) 05/31/2017  . Fatigue 05/31/2017  . Vitamin D deficiency 05/31/2017  . Noncompliance with diet and medication regimen 05/31/2017  . Overweight (BMI 25.0-29.9) 05/30/2016  . Poor diet 05/30/2016  . Inactivity- not working out and no cardio 05/30/2016  . HTN (hypertension) 02/23/2016  . History of tobacco use 02/23/2016  . Hepatitis C infection- s/p harvoni  02/23/2016  . History of colonic polyps 02/23/2016  . Hematuria- chronic (followed by Dr Junious Silk urology) 07/11/2015  . BPH with obstruction/lower urinary tract symptoms- Urology 07/09/2015  . DDD (degenerative disc disease), lumbar 12/01/2013  . Lumbar radiculitis 12/01/2013     Past Medical History:  Diagnosis Date  . BPH (benign prostatic hypertrophy) with urinary retention   . Complication of anesthesia    "fear of not waking up"  . Foley catheter in place   . Hepatitis C antibody test positive    dx 1997--  pt is getting treatment in near future (last enzymes 12 /2016 stable per pt and in epic)  . History of colon polyps   . History of hypertension    no medication since 2014 lost weight and decrease stress  . Hypertension      Past Surgical History:  Procedure Laterality Date  . ANAL FISSURE REPAIR    . COLONOSCOPY WITH PROPOFOL  last one 2015  . GREEN LIGHT LASER TURP (TRANSURETHRAL RESECTION OF PROSTATE N/A 07/09/2015   Procedure: GREEN LIGHT LASER TURP (TRANSURETHRAL RESECTION OF PROSTATE;  Surgeon: Festus Aloe, MD;  Location: [facility];  Service: Urology;  Laterality: N/A;  . INGUINAL HERNIA REPAIR Right age 62  . TRANSURETHRAL RESECTION OF PROSTATE N/A 07/09/2015   Procedure: CYSTOSCOPY WITH CLOT EVACUATION //FULGERATION OF BLEEDERS;  Surgeon: Alexis Frock, MD;  Location: [facility];  Service: Urology;  Laterality: N/A;     Family History  Problem Relation Age of Onset  . Cancer Mother 68       BONE  . Cancer Father  78       COLON  . Hypertension Brother      Social History   Substance and Sexual Activity  Drug Use No     Social History   Substance and Sexual Activity  Alcohol Use No     Social History   Tobacco Use  Smoking Status Former Smoker  . Packs/day: 0.00  . Years: 25.00  . Pack years: 0.00  . Types: Cigarettes  Smokeless Tobacco Never Used  Tobacco Comment   5 CIG. PER DAY     Outpatient Encounter  Medications as of 09/20/2017  Medication Sig  . Cholecalciferol (VITAMIN D3) 5000 units TABS 5,000 IU OTC vitamin D3 daily.  Marland Kitchen losartan-hydrochlorothiazide (HYZAAR) 100-25 MG tablet TAKE 1 TABLET BY MOUTH EVERY DAY  . omeprazole (PRILOSEC) 20 MG capsule TAKE 1 CAPSULE BY MOUTH EVERY DAY  . ranitidine (ZANTAC) 300 MG tablet Take 0.5 tablets (150 mg total) by mouth 2 (two) times daily. (Patient taking differently: Take 150 mg by mouth 2 (two) times daily as needed. )  . Vitamin D, Ergocalciferol, (DRISDOL) 50000 units CAPS capsule Take 1 capsule (50,000 Units total) by mouth every 7 (seven) days.   No facility-administered encounter medications on file as of 09/20/2017.     Allergies: Patient has no known allergies.  Body mass index is 30.04 kg/m.  Blood pressure 114/78, pulse 96, temperature 98.5 F (36.9 C), temperature source Oral, height 5\' 7"  (1.702 m), weight 191 lb 12.8 oz (87 kg), SpO2 96 %.      Review of Systems  Constitutional: Negative for activity change, appetite change, chills, diaphoresis, fatigue, fever and unexpected weight change.  HENT: Positive for facial swelling. Negative for congestion, dental problem, ear pain, hearing loss, sinus pressure, sinus pain, sneezing, sore throat, tinnitus, trouble swallowing and voice change.   Eyes: Negative for visual disturbance.  Respiratory: Negative for cough, chest tightness, shortness of breath, wheezing and stridor.   Cardiovascular: Negative for chest pain, palpitations and leg swelling.  Gastrointestinal: Negative for abdominal distention, anal bleeding, blood in stool, constipation, diarrhea, nausea and vomiting.  Genitourinary: Negative for difficulty urinating and flank pain.  Skin: Negative for color change, pallor, rash and wound.  Neurological: Negative for dizziness, tremors, speech difficulty, weakness and headaches.       Objective:   Physical Exam  Constitutional: He appears well-developed and  well-nourished. No distress.  HENT:  Head: Normocephalic and atraumatic.  Right Ear: Hearing, tympanic membrane, external ear and ear canal normal. No drainage or swelling. Tympanic membrane is not perforated, not erythematous and not bulging. No decreased hearing is noted.  Left Ear: Hearing, tympanic membrane, external ear and ear canal normal. No drainage or swelling. Tympanic membrane is not perforated, not erythematous and not bulging. No decreased hearing is noted.  Nose: No mucosal edema or rhinorrhea. Right sinus exhibits no maxillary sinus tenderness and no frontal sinus tenderness. Left sinus exhibits no maxillary sinus tenderness and no frontal sinus tenderness.  Mouth/Throat: Mucous membranes are dry. Abnormal dentition. No dental abscesses, uvula swelling or dental caries. No oropharyngeal exudate, posterior oropharyngeal edema, posterior oropharyngeal erythema or tonsillar abscesses.  Eyes: Pupils are equal, round, and reactive to light. Conjunctivae are normal.  Neck: Normal range of motion. Neck supple. No spinous process tenderness and no muscular tenderness present. No neck rigidity. No erythema and normal range of motion present.    Swollen, non tender tissue from just below L ear that extends to L submandibular gland   Cardiovascular:  Normal rate, regular rhythm, normal heart sounds and intact distal pulses.  No murmur heard. Pulmonary/Chest: Effort normal and breath sounds normal. No accessory muscle usage. No tachypnea and no bradypnea. No respiratory distress. He has no decreased breath sounds. He has no wheezes. He has no rhonchi. He has no rales. He exhibits no tenderness.  Skin: Skin is warm and dry. No rash noted. He is not diaphoretic. No erythema. No pallor.  Psychiatric: He has a normal mood and affect. His behavior is normal. Judgment and thought content normal.  Nursing note and vitals reviewed.     Assessment & Plan:   1. Localized swelling, mass or lump of  neck     Localized swelling, mass or lump of neck Vital signs and examination are stable No evidence of infection or anaphylactic reaction Increase fluids. Please take OTC Antihistamines, ie Claritin. If swelling increase or you develop wheezing, difficulty breathing, throat swelling, rash on chest- seek immediate medical attention- pt verbalized understanding/agreement. If swelling is still present in 1 week, call clinic and we will send you for ultrasound of soft tissue.    FOLLOW-UP:  Return if symptoms worsen or fail to improve.

## 2017-10-08 DIAGNOSIS — N400 Enlarged prostate without lower urinary tract symptoms: Secondary | ICD-10-CM | POA: Diagnosis not present

## 2017-10-08 DIAGNOSIS — R310 Gross hematuria: Secondary | ICD-10-CM | POA: Diagnosis not present

## 2017-10-09 DIAGNOSIS — R3912 Poor urinary stream: Secondary | ICD-10-CM | POA: Diagnosis not present

## 2017-10-09 DIAGNOSIS — R972 Elevated prostate specific antigen [PSA]: Secondary | ICD-10-CM | POA: Diagnosis not present

## 2017-10-09 DIAGNOSIS — R310 Gross hematuria: Secondary | ICD-10-CM | POA: Diagnosis not present

## 2017-10-30 DIAGNOSIS — R972 Elevated prostate specific antigen [PSA]: Secondary | ICD-10-CM | POA: Diagnosis not present

## 2017-10-30 DIAGNOSIS — C61 Malignant neoplasm of prostate: Secondary | ICD-10-CM | POA: Diagnosis not present

## 2017-10-30 DIAGNOSIS — N411 Chronic prostatitis: Secondary | ICD-10-CM | POA: Diagnosis not present

## 2017-11-20 DIAGNOSIS — C61 Malignant neoplasm of prostate: Secondary | ICD-10-CM | POA: Diagnosis not present

## 2017-11-20 DIAGNOSIS — R3912 Poor urinary stream: Secondary | ICD-10-CM | POA: Diagnosis not present

## 2017-11-20 DIAGNOSIS — R310 Gross hematuria: Secondary | ICD-10-CM | POA: Diagnosis not present

## 2017-11-22 ENCOUNTER — Other Ambulatory Visit: Payer: Self-pay | Admitting: Family Medicine

## 2017-11-22 DIAGNOSIS — K219 Gastro-esophageal reflux disease without esophagitis: Secondary | ICD-10-CM

## 2017-12-01 ENCOUNTER — Other Ambulatory Visit: Payer: Self-pay | Admitting: Family Medicine

## 2017-12-01 DIAGNOSIS — I10 Essential (primary) hypertension: Secondary | ICD-10-CM

## 2017-12-01 DIAGNOSIS — K219 Gastro-esophageal reflux disease without esophagitis: Secondary | ICD-10-CM

## 2018-01-17 ENCOUNTER — Other Ambulatory Visit: Payer: Self-pay | Admitting: Family Medicine

## 2018-03-06 ENCOUNTER — Other Ambulatory Visit: Payer: Self-pay | Admitting: Family Medicine

## 2018-03-06 DIAGNOSIS — K219 Gastro-esophageal reflux disease without esophagitis: Secondary | ICD-10-CM

## 2018-03-06 DIAGNOSIS — I10 Essential (primary) hypertension: Secondary | ICD-10-CM

## 2018-03-13 ENCOUNTER — Ambulatory Visit (INDEPENDENT_AMBULATORY_CARE_PROVIDER_SITE_OTHER): Payer: BLUE CROSS/BLUE SHIELD | Admitting: Family Medicine

## 2018-03-13 ENCOUNTER — Encounter: Payer: Self-pay | Admitting: Family Medicine

## 2018-03-13 VITALS — BP 126/87 | HR 79 | Ht 67.0 in | Wt 196.0 lb

## 2018-03-13 DIAGNOSIS — Z91148 Patient's other noncompliance with medication regimen for other reason: Secondary | ICD-10-CM

## 2018-03-13 DIAGNOSIS — R7302 Impaired glucose tolerance (oral): Secondary | ICD-10-CM | POA: Diagnosis not present

## 2018-03-13 DIAGNOSIS — R310 Gross hematuria: Secondary | ICD-10-CM

## 2018-03-13 DIAGNOSIS — Z9114 Patient's other noncompliance with medication regimen: Secondary | ICD-10-CM

## 2018-03-13 DIAGNOSIS — I10 Essential (primary) hypertension: Secondary | ICD-10-CM | POA: Diagnosis not present

## 2018-03-13 DIAGNOSIS — E7800 Pure hypercholesterolemia, unspecified: Secondary | ICD-10-CM

## 2018-03-13 DIAGNOSIS — Z23 Encounter for immunization: Secondary | ICD-10-CM

## 2018-03-13 DIAGNOSIS — E786 Lipoprotein deficiency: Secondary | ICD-10-CM

## 2018-03-13 DIAGNOSIS — Z9111 Patient's noncompliance with dietary regimen: Secondary | ICD-10-CM

## 2018-03-13 DIAGNOSIS — N401 Enlarged prostate with lower urinary tract symptoms: Secondary | ICD-10-CM

## 2018-03-13 DIAGNOSIS — E559 Vitamin D deficiency, unspecified: Secondary | ICD-10-CM

## 2018-03-13 DIAGNOSIS — R7303 Prediabetes: Secondary | ICD-10-CM | POA: Insufficient documentation

## 2018-03-13 DIAGNOSIS — K219 Gastro-esophageal reflux disease without esophagitis: Secondary | ICD-10-CM

## 2018-03-13 MED ORDER — LOSARTAN POTASSIUM-HCTZ 100-25 MG PO TABS: 1.0000 | ORAL_TABLET | Freq: Every day | ORAL | 1 refills | Status: DC

## 2018-03-13 MED ORDER — OMEPRAZOLE 20 MG PO CPDR: 20.0000 mg | DELAYED_RELEASE_CAPSULE | Freq: Every day | ORAL | 1 refills | Status: DC

## 2018-03-13 NOTE — Progress Notes (Signed)
Impression and Recommendations:    1. Essential hypertension   2. Glucose intolerance (impaired glucose tolerance)   3. Elevated LDL cholesterol level   4. Low HDL (under 40)   5. Vitamin D deficiency   6. Noncompliance with diet and medication regimen   7. Gastroesophageal reflux disease, esophagitis presence not specified   8. Benign prostatic hyperplasia with lower urinary tract symptoms, symptom details unspecified   9. Gross hematuria   10. Flu vaccine need     HTN -Stable; continue meds -Educated pt that pain can increase blood pressure -explained how hydration impacts blood pressure as well -Explained out HTN out of control negatively effects ASCVD risk  -Explained how HTN and HLD together increase risk for heart attack and stroke  Prediabetes -Return for A1C testing -Reviewed previous A1C -Discussed how DM in conjunction with HLD and HTN can negatively impact cardiovascular health -Discussed how exercise can help prevent progression into diabetes -Encouraged pt to make diet and exercise changes to prevent worsening  HLD -HDL and LDL not at goal; Return for lipid panel -Pt agreed to start cholesterol meds if his cholesterol is still high -Encouraged pt to consider getting FBW to check cholesterol levels -Reviewed with pt that his ASCVD risk level, it strongly encouraged for him to be on medication -Explained the positive impact of weight loss, exercise and healthy diet on cholesterol levels -Discussed common SE of cholesterol medication including muscle aches and soreness -Explained that increasing water intake and exercise often mediates muscle aches  -Explained how A1C, diabetes and HTN negatively effect cholesterol and with increased A1c, he may have to take medications regardless -Explained that increased cholesterol levels increase odds of heart attack and stroke -Instructed pt to return for FBW in order to determine current cholesterol levels and direct  treatment  The 10-year ASCVD risk score Mikey Bussing DC Jr., et al., 2013) is: 11.2%   Values used to calculate the score:     Age: 81 years     Sex: Male     Is Non-Hispanic African American: No     Diabetic: No     Tobacco smoker: No     Systolic Blood Pressure: 030 mmHg     Is BP treated: Yes     HDL Cholesterol: 30 mg/dL     Total Cholesterol: 156 mg/dL  Vitamin D deficiency -Reviewed how pt is supposed to take his prescribed Vitamin D -Explained that pt's insurance determines how much medication can be prescribed at one time -encouraged pt to continue taking medications as prescribed -Explained that the current dosage is too high to take medication every day -Offered to change dosage; pt declined  Parotid Gland -explained that most people have an inflamed parotid gland due to lack of hydration -Explained anatomy and location of parotid gland -Explained that an ear rinse would likely not impact the parotid gland -Explained the the best way to deal with it is   Prostate -Explained how finasteride and his enlarged prostate may impact how frequently he is using the bathroom -Reviewed urology findings with patients from April 2019 -reviewed medications prescribed by urology with pt for understanding  Health Management -Ordered FBW  Exercise -Explained the requirements for walking to count as cardiovascular exercise -Explained AHA guidelines for exercise each week  -Explained that exercise helps with DM, cholesterol and HTN management -Explained how exercise helps improve erectile dysfunction and encouraged pt to begin walking with his wife each evening    Education and routine counseling  performed. Handouts provided.  Medications Discontinued During This Encounter  Medication Reason  . losartan-hydrochlorothiazide (HYZAAR) 100-25 MG tablet Reorder  . omeprazole (PRILOSEC) 20 MG capsule Reorder    Orders Placed This Encounter  Procedures  . Flu Vaccine QUAD 6+ mos PF IM  (Fluarix Quad PF)  . Comprehensive metabolic panel  . Hemoglobin A1c  . Lipid panel  . VITAMIN D 25 Hydroxy (Vit-D Deficiency, Fractures)    The patient was counseled, risk factors were discussed, anticipatory guidance given.  Gross side effects, risk and benefits, and alternatives of medications discussed with patient.  Patient is aware that all medications have potential side effects and we are unable to predict every side effect or drug-drug interaction that may occur.  Expresses verbal understanding and consents to current therapy plan and treatment regimen.  Return for FBW near future;  and OV 8 wks if goes on chol meds.  Please see AVS handed out to patient at the end of our visit for further patient instructions/ counseling done pertaining to today's office visit.    Note:  This document was prepared using Dragon voice recognition software and may include unintentional dictation errors.  This document serves as a record of services personally performed by Mellody Dance, MD. It was created on her behalf by Georga Bora, a trained medical scribe. The creation of this record is based on the scribe's personal observations and the provider's statements to them.   I have reviewed the above medical documentation for accuracy and completeness and I concur.  Mellody Dance 03/19/18 7:37 AM      Subjective:    HPI: CASHTYN POULIOT is a 62 y.o. male who presents to [place] at [facility] today for follow up of [place].    Parotid Gland inflammation -Pt states came in previously for inflamed parotid gland with Olene Floss -says in the past he was treated for this with an ear canal rinse and asked if that helps -Asked what the normal treatment for parotid gland inflammation -Gland is not currently inflamed   HTN:  -  His blood pressure has been controlled at home.  Pt has been checking it regularly. - Patient reports good compliance with  blood pressure medications - Denies medication S-E   - Smoking Status noted   - He denies new onset of: chest pain, exercise intolerance, shortness of breath, dizziness, visual changes, headache, lower extremity swelling or claudication.    Last 3 blood pressure readings in our office are as follows: BP Readings from Last 3 Encounters:  03/13/18 126/87  09/20/17 114/78  06/28/17 119/81    Pulse Readings from Last 3 Encounters:  03/13/18 79  09/20/17 96  06/28/17 84   Prostate -States his prostate kept bleeding and was "juicy" per urologist -Was placed on finasteride to try and help with bleeding into urine -Says his prostate has only bled once since beginning medications -Urology His prostate is massive. Very low threshold for finasteride to slow progression, he is at very high risk of needing future surgery.   We discussed DDX gross hematuria. Rec ful eval. BMP today, RTC with CT  And cysto, hopefully this is just due to large prostate in which case finasteride would be treatment of choice."  CHOL HPI:  -Pt not currently on medication; refused at last appointment -Patient reports very little compliance with low chol/ saturated and trans fat diet.  No exercise  Last lipid panel as follows:  Lab Results  Component Value  Date   CHOL 156 03/15/2018   HDL 30 (L) 03/15/2018   LDLCALC 87 03/15/2018   TRIG 197 (H) 03/15/2018   CHOLHDL 5.2 (H) 03/15/2018    Hepatic Function Latest Ref Rng & Units 03/15/2018 05/31/2017 05/30/2016  Total Protein 6.0 - 8.5 g/dL 7.5 7.8 7.6  Albumin 3.6 - 4.8 g/dL 4.7 4.6 4.6  AST 0 - 40 IU/L 18 15 13   ALT 0 - 44 IU/L 16 14 14   Alk Phosphatase 39 - 117 IU/L 72 63 57  Total Bilirubin 0.0 - 1.2 mg/dL 1.1 1.0 1.0    Vitamin D deficiency -Pt was confused about the amount of vitamin D he was supposed to take -States he was only able to get 7 pills to last him a month -Says he noticed improvement in energy levels after taking the pill and wanted to  take them daily   Filed Weights   03/13/18 1400  Weight: 196 lb (88.9 kg)      Patient Care Team    Relationship Specialty Notifications Start End  Mellody Dance, DO PCP - General Family Medicine  02/23/16   Arta Silence, MD Consulting Physician Gastroenterology  02/23/16   Danella Sensing, MD Consulting Physician Dermatology  02/23/16   Festus Aloe, MD Consulting Physician Urology  02/23/16   Sharlet Salina, MD Referring Physician Physical Medicine and Rehabilitation  02/23/16    Comment: lower back/ transformainal injections  Alexis Frock, MD Consulting Physician Urology  03/13/18      Lab Results  Component Value Date   CREATININE 1.05 03/15/2018   BUN 13 03/15/2018   NA 138 03/15/2018   K 3.7 03/15/2018   CL 96 03/15/2018   CO2 26 03/15/2018    Lab Results  Component Value Date   CHOL 156 03/15/2018   CHOL 169 05/31/2017   CHOL 176 05/30/2016    Lab Results  Component Value Date   HDL 30 (L) 03/15/2018   HDL 35 (L) 05/31/2017   HDL 36 (L) 05/30/2016    Lab Results  Component Value Date   LDLCALC 87 03/15/2018   LDLCALC 114 (H) 05/31/2017   LDLCALC 113 (H) 05/30/2016    Lab Results  Component Value Date   TRIG 197 (H) 03/15/2018   TRIG 100 05/31/2017   TRIG 134 05/30/2016    Lab Results  Component Value Date   CHOLHDL 5.2 (H) 03/15/2018   CHOLHDL 4.8 05/31/2017   CHOLHDL 4.9 05/30/2016    No results found for: LDLDIRECT ===================================================================   Patient Active Problem List   Diagnosis Date Noted  . Elevated LDL cholesterol level 05/31/2017    Priority: High  . Low HDL (under 40) 05/31/2017    Priority: High  . Overweight (BMI 25.0-29.9) 05/30/2016    Priority: High  . HTN (hypertension) 02/23/2016    Priority: High  . GERD (gastroesophageal reflux disease) 05/31/2017    Priority: Medium  . Fatigue 05/31/2017    Priority: Medium  . Hepatitis C infection- s/p harvoni 02/23/2016      Priority: Medium  . History of colonic polyps 02/23/2016    Priority: Medium  . Benign prostatic hyperplasia with lower urinary tract symptoms 07/09/2015    Priority: Medium  . Vitamin D deficiency 05/31/2017    Priority: Low  . Poor diet 05/30/2016    Priority: Low  . Inactivity- not working out and no cardio 05/30/2016    Priority: Low  . History of tobacco use 02/23/2016    Priority: Low  . Hematuria-  chronic (followed by Dr Junious Silk urology) 07/11/2015    Priority: Low  . Glucose intolerance (impaired glucose tolerance) 03/13/2018  . Localized swelling, mass or lump of neck 09/20/2017  . Noncompliance with diet and medication regimen 05/31/2017  . DDD (degenerative disc disease), lumbar 12/01/2013  . Lumbar radiculitis 12/01/2013     Past Medical History:  Diagnosis Date  . BPH (benign prostatic hypertrophy) with urinary retention   . Complication of anesthesia    "fear of not waking up"  . Foley catheter in place   . Hepatitis C antibody test positive    dx 1997--  pt is getting treatment in near future (last enzymes 12 /2016 stable per pt and in epic)  . History of colon polyps   . History of hypertension    no medication since 2014 lost weight and decrease stress  . Hypertension      Past Surgical History:  Procedure Laterality Date  . ANAL FISSURE REPAIR    . COLONOSCOPY WITH PROPOFOL  last one 2015  . GREEN LIGHT LASER TURP (TRANSURETHRAL RESECTION OF PROSTATE N/A 07/09/2015   Procedure: GREEN LIGHT LASER TURP (TRANSURETHRAL RESECTION OF PROSTATE;  Surgeon: Festus Aloe, MD;  [facility];  Service: Urology;  Laterality: N/A;  . INGUINAL HERNIA REPAIR Right age 24  . TRANSURETHRAL RESECTION OF PROSTATE N/A 07/09/2015   Procedure: CYSTOSCOPY WITH CLOT EVACUATION //FULGERATION OF BLEEDERS;  Surgeon: Alexis Frock, MD;  Location: [facility];  Service: Urology;  Laterality: N/A;     Family History  Problem Relation Age of Onset  .  Cancer Mother 13       BONE  . Cancer Father 30       COLON  . Hypertension Brother      Social History   Substance and Sexual Activity  Drug Use No  ,  Social History   Substance and Sexual Activity  Alcohol Use No  ,  Social History   Tobacco Use  Smoking Status Former Smoker  . Packs/day: 0.00  . Years: 25.00  . Pack years: 0.00  . Types: Cigarettes  Smokeless Tobacco Never Used  Tobacco Comment   5 CIG. PER DAY  ,    Current Outpatient Medications on File Prior to Visit  Medication Sig Dispense Refill  . Cholecalciferol (VITAMIN D3) 5000 units TABS 5,000 IU OTC vitamin D3 daily. 90 tablet 3  . ranitidine (ZANTAC) 300 MG tablet TAKE 0.5 TABLETS (150 MG TOTAL) BY MOUTH 2 (TWO) TIMES DAILY. 90 tablet 1  . Vitamin D, Ergocalciferol, (DRISDOL) 50000 units CAPS capsule TAKE 1 CAPSULE (50,000 UNITS TOTAL) BY MOUTH EVERY 7 (SEVEN) DAYS. 12 capsule 1  . finasteride (PROSCAR) 5 MG tablet Take 5 mg by mouth daily.  3   No current facility-administered medications on file prior to visit.      No Known Allergies   Review of Systems:   General:  Denies fever, chills Optho/Auditory:   Denies visual changes, blurred vision Respiratory:   Denies SOB, cough, wheeze, DIB  Cardiovascular:   Denies chest pain, palpitations, painful respirations Gastrointestinal:   Denies nausea, vomiting, diarrhea.  Endocrine:     Denies new hot or cold intolerance Musculoskeletal:  Denies joint swelling, gait issues, or new unexplained myalgias/ arthralgias Skin:  Denies rash, suspicious lesions  Neurological:    Denies dizziness, unexplained weakness, numbness  Psychiatric/Behavioral:   Denies mood changes  Objective:    Blood pressure 126/87, pulse 79, height 5' 7"  (1.702 m), weight  196 lb (88.9 kg), SpO2 98 %.  Body mass index is 30.7 kg/m.  General: Well Developed, well nourished, and in no acute distress.  HEENT: Normocephalic, atraumatic, pupils equal round reactive to light,  neck supple, No carotid bruits, no JVD Skin: Warm and dry, cap RF less 2 sec Cardiac: Regular rate and rhythm, S1, S2 WNL's, no murmurs rubs or gallops Respiratory: ECTA B/L, Not using accessory muscles, speaking in full sentences. NeuroM-Sk: Ambulates w/o assistance, moves ext * 4 w/o difficulty, sensation grossly intact.  Ext: scant edema b/l lower ext Psych: No HI/SI, judgement and insight good, Euthymic mood. Full Affect.

## 2018-03-13 NOTE — Patient Instructions (Signed)
It was great to see you Ric!     -As we discussed please make a follow-up office visit for fasting blood work near future.  We will call you about these results.  If we put you on a cholesterol meds we will have to check your liver enzymes 6 weeks after you start the medicine so please anticipate a 6-week blood work and then 8-week follow-up in the office with me or right before the end of the year if that is your preference.    Guidelines for a Low Cholesterol, Low Saturated Fat Diet  Fats - Limit total intake of fats and oils. - Avoid butter, stick margarine, shortening, lard, palm and coconut oils. - Limit mayonnaise, salad dressings, gravies and sauces, unless they are homemade with low-fat ingredients. - Limit chocolate. - Choose low-fat and nonfat products, such as low-fat mayonnaise, low-fat or non-hydrogenated peanut butter, low-fat or fat-free salad dressings and nonfat gravy. - Use vegetable oil, such as canola or olive oil. - Look for margarine that does not contain trans fatty acids. - Use nuts in moderate amounts. - Read ingredient labels carefully to determine both amount and type of fat present in foods. Limit saturated and trans fats! - Avoid high-fat processed and convenience foods.  Meats and Meat Alternatives - Choose fish, chicken, [place] and lean meats. - Use dried beans, peas, lentils and tofu. - Limit egg yolks to three to four per week. - If you eat red meat, limit to no more than three servings per week and choose loin or round cuts. - Avoid fatty meats, such as bacon, sausage, franks, luncheon meats and ribs. - Avoid all organ meats, including liver.  Dairy - Choose nonfat or low-fat milk, yogurt and cottage cheese. - Most cheeses are high in fat. Choose cheeses made from non-fat milk, such as mozzarella and ricotta cheese. - Choose light or fat-free cream cheese and sour cream. - Avoid cream and sauces made with cream.  Fruits and Vegetables - Eat a wide  variety of fruits and vegetables. - Use lemon juice, vinegar or "mist" olive oil on vegetables. - Avoid adding sauces, fat or oil to vegetables.  Breads, Cereals and Grains - Choose whole-grain breads, cereals, pastas and rice. - Avoid high-fat snack foods, such as granola, cookies, pies, pastries, doughnuts and croissants.  Cooking Tips - Avoid deep fried foods. - Trim visible fat off meats and remove skin from poultry before cooking. - Bake, broil, boil, poach or roast poultry, fish and lean meats. - Drain and discard fat that drains out of meat as you cook it. - Add little or no fat to foods. - Use vegetable oil sprays to grease pans for cooking or baking. - Steam vegetables. - Use herbs or no-oil marinades to flavor foods.

## 2018-03-15 ENCOUNTER — Other Ambulatory Visit: Payer: BLUE CROSS/BLUE SHIELD

## 2018-03-15 DIAGNOSIS — R7302 Impaired glucose tolerance (oral): Secondary | ICD-10-CM

## 2018-03-15 DIAGNOSIS — E559 Vitamin D deficiency, unspecified: Secondary | ICD-10-CM | POA: Diagnosis not present

## 2018-03-15 DIAGNOSIS — E786 Lipoprotein deficiency: Secondary | ICD-10-CM

## 2018-03-15 DIAGNOSIS — E7800 Pure hypercholesterolemia, unspecified: Secondary | ICD-10-CM | POA: Diagnosis not present

## 2018-03-16 LAB — LIPID PANEL
CHOLESTEROL TOTAL: 156 mg/dL (ref 100–199)
Chol/HDL Ratio: 5.2 ratio — ABNORMAL HIGH (ref 0.0–5.0)
HDL: 30 mg/dL — AB (ref >39)
LDL Calculated: 87 mg/dL (ref 0–99)
TRIGLYCERIDES: 197 mg/dL — AB (ref 0–149)
VLDL Cholesterol Cal: 39 mg/dL (ref 5–40)

## 2018-03-16 LAB — HEMOGLOBIN A1C
Est. average glucose Bld gHb Est-mCnc: 117 mg/dL
Hgb A1c MFr Bld: 5.7 % — ABNORMAL HIGH (ref 4.8–5.6)

## 2018-03-16 LAB — VITAMIN D 25 HYDROXY (VIT D DEFICIENCY, FRACTURES): VIT D 25 HYDROXY: 51.2 ng/mL (ref 30.0–100.0)

## 2018-03-16 LAB — COMPREHENSIVE METABOLIC PANEL
ALBUMIN: 4.7 g/dL (ref 3.6–4.8)
ALT: 16 IU/L (ref 0–44)
AST: 18 IU/L (ref 0–40)
Albumin/Globulin Ratio: 1.7 (ref 1.2–2.2)
Alkaline Phosphatase: 72 IU/L (ref 39–117)
BUN/Creatinine Ratio: 12 (ref 10–24)
BUN: 13 mg/dL (ref 8–27)
Bilirubin Total: 1.1 mg/dL (ref 0.0–1.2)
CALCIUM: 9.4 mg/dL (ref 8.6–10.2)
CO2: 26 mmol/L (ref 20–29)
CREATININE: 1.05 mg/dL (ref 0.76–1.27)
Chloride: 96 mmol/L (ref 96–106)
GFR, EST AFRICAN AMERICAN: 89 mL/min/{1.73_m2} (ref >59)
GFR, EST NON AFRICAN AMERICAN: 77 mL/min/{1.73_m2} (ref >59)
GLOBULIN, TOTAL: 2.8 g/dL (ref 1.5–4.5)
GLUCOSE: 126 mg/dL — AB (ref 65–99)
Potassium: 3.7 mmol/L (ref 3.5–5.2)
SODIUM: 138 mmol/L (ref 134–144)
TOTAL PROTEIN: 7.5 g/dL (ref 6.0–8.5)

## 2018-03-19 ENCOUNTER — Encounter: Payer: Self-pay | Admitting: Family Medicine

## 2018-03-19 ENCOUNTER — Other Ambulatory Visit: Payer: Self-pay

## 2018-03-19 DIAGNOSIS — E786 Lipoprotein deficiency: Secondary | ICD-10-CM

## 2018-03-19 DIAGNOSIS — E7800 Pure hypercholesterolemia, unspecified: Secondary | ICD-10-CM

## 2018-03-19 MED ORDER — ATORVASTATIN CALCIUM 40 MG PO TABS: 40.0000 mg | ORAL_TABLET | Freq: Every day | ORAL | 1 refills | Status: DC

## 2018-03-19 NOTE — Progress Notes (Signed)
Per Dr. Hershal Coria note on lab results sent in Lipitor 40 mg 1 po nightly #90 1 RF.  Patient notified. MPulliam, CMA/RT(R)

## 2018-03-19 NOTE — Progress Notes (Signed)
Medication sent in and patient notified. MPulliam, CMA/RT(R)  

## 2018-04-18 ENCOUNTER — Ambulatory Visit (INDEPENDENT_AMBULATORY_CARE_PROVIDER_SITE_OTHER): Payer: BLUE CROSS/BLUE SHIELD | Admitting: Family Medicine

## 2018-04-18 ENCOUNTER — Encounter: Payer: Self-pay | Admitting: Family Medicine

## 2018-04-18 VITALS — BP 131/89 | HR 71 | Temp 97.8°F | Ht 67.0 in | Wt 201.0 lb

## 2018-04-18 DIAGNOSIS — Z79899 Other long term (current) drug therapy: Secondary | ICD-10-CM

## 2018-04-18 DIAGNOSIS — M5416 Radiculopathy, lumbar region: Secondary | ICD-10-CM

## 2018-04-18 DIAGNOSIS — E786 Lipoprotein deficiency: Secondary | ICD-10-CM

## 2018-04-18 DIAGNOSIS — I10 Essential (primary) hypertension: Secondary | ICD-10-CM | POA: Diagnosis not present

## 2018-04-18 DIAGNOSIS — M5136 Other intervertebral disc degeneration, lumbar region: Secondary | ICD-10-CM

## 2018-04-18 DIAGNOSIS — E7800 Pure hypercholesterolemia, unspecified: Secondary | ICD-10-CM

## 2018-04-18 DIAGNOSIS — R7302 Impaired glucose tolerance (oral): Secondary | ICD-10-CM

## 2018-04-18 NOTE — Patient Instructions (Signed)
Please come in end of December for fasting lipid profile as well as CMP.  This is a lab only visit.  Otherwise, Liliane Channel please check your blood pressure at home and if not running 130/80 or less on a regular basis, please call us and let us know.  Otherwise I will see you in 4 months or sooner if needed.   Nine ways to increase your "good" HDL cholesterol  High-density lipoprotein (HDL) is often referred to as the "good" cholesterol. Having high HDL levels helps carry cholesterol from your arteries to your liver, where it can be used or excreted.  Having high levels of HDL also has antioxidant and anti-inflammatory effects, and is linked to a reduced risk of heart disease (1, 2).  Most health experts recommend minimum blood levels of 40 mg/dl in men and 50 mg/dl in women.  While genetics definitely play a role, there are several other factors that affect HDL levels.  Here are nine healthy ways to raise your "good" HDL cholesterol.  1. Consume olive oil  two pieces of salmon on a plate olive oil being poured into a small dish Extra virgin olive oil may be more healthful than processed olive oils. Olive oil is one of the healthiest fats around.  A large analysis of 42 studies with more than 800,000 participants found that olive oil was the only source of monounsaturated fat that seemed to reduce heart disease risk (3).  Research has shown that one of olive oil's heart-healthy effects is an increase in HDL cholesterol. This effect is thought to be caused by antioxidants it contains called polyphenols (4, 5, 6, 7).  Extra virgin olive oil has more polyphenols than more processed olive oils, although the amount can still vary among different types and brands.  One study gave 200 healthy young men about 2 tablespoons (25 ml) of different olive oils per day for three weeks.  The researchers found that participants' HDL levels increased significantly more after they consumed the olive oil with  the highest polyphenol content (6).  In another study, when 23 older adults consumed about 4 tablespoons (50 ml) of high-polyphenol extra virgin olive oil every day for six weeks, their HDL cholesterol increased by 6.5 mg/dl, on average (7).  In addition to raising HDL levels, olive oil has been found to boost HDL's anti-inflammatory and antioxidant function in studies of older people and individuals with high cholesterol levels ( 7, 8, 9).  Whenever possible, select high-quality, certified extra virgin olive oils, which tend to be highest in polyphenols.  Bottom line: Extra virgin olive oil with a high polyphenol content has been shown to increase HDL levels in healthy people, the elderly and individuals with high cholesterol.  2. Follow a low-carb or ketogenic diet  Low-carb and ketogenic diets provide a number of health benefits, including weight loss and reduced blood sugar levels.  They have also been shown to increase HDL cholesterol in people who tend to have lower levels.  This includes those who are obese, insulin resistant or diabetic (10, 11, 12, 13, 14, 15, 16, 17).  In one study, people with type 2 diabetes were split into two groups.  One followed a diet consuming less than 50 grams of carbs per day. The other followed a high-carb diet.  Although both groups lost weight, the low-carb group's HDL cholesterol increased almost twice as much as the high-carb group's did (14).  In another study, obese people who followed a low-carb diet experienced an increase  in HDL cholesterol of 5 mg/dl overall.  Meanwhile, in the same study, the participants who ate a low-fat, high-carb diet showed a decrease in HDL cholesterol (15).  This response may partially be due to the higher levels of fat people typically consume on low-carb diets.  One study in overweight women found that diets high in meat and cheese increased HDL levels by 5-8%, compared to a higher-carb diet (18).  What's  more, in addition to raising HDL cholesterol, very-low-carb diets have been shown to decrease triglycerides and improve several other risk factors for heart disease (13, 14, 16, 17).  Bottom line: Low-carb and ketogenic diets typically increase HDL cholesterol levels in people with diabetes, metabolic syndrome and obesity.  3. Exercise regularly  Being physically active is important for heart health.  Studies have shown that many different types of exercise are effective at raising HDL cholesterol, including strength training, high-intensity exercise and aerobic exercise (19, 20, 21, 22, 23, 24).  However, the biggest increases in HDL are typically seen with high-intensity exercise.  One small study followed women who were living with polycystic ovary syndrome (PCOS), which is linked to a higher risk of insulin resistance. The study required them to perform high-intensity exercise three times a week.  The exercise led to an increase in HDL cholesterol of 8 mg/dL after 10 weeks. The women also showed improvements in other health markers, including decreased insulin resistance and improved arterial function (23).  In a 12-week study, overweight men who performed high-intensity exercise experienced a 10% increase in HDL cholesterol.  In contrast, the low-intensity exercise group showed only a 2% increase and the endurance training group experienced no change (24).  However, even lower-intensity exercise seems to increase HDL's anti-inflammatory and antioxidant capacities, whether or not HDL levels change (20, 21, 25).  Overall, high-intensity exercise such as high-intensity interval training (HIIT) and high-intensity circuit training (HICT) may boost HDL cholesterol levels the most.  Bottom line: Exercising several times per week can help raise HDL cholesterol and enhance its anti-inflammatory and antioxidant effects. High-intensity forms of exercise may be especially effective.  4. Add  coconut oil to your diet  Studies have shown that coconut oil may reduce appetite, increase metabolic rate and help protect brain health, among other benefits.  Some people may be concerned about coconut oil's effects on heart health due to its high saturated fat content.  However, it appears that coconut oil is actually quite heart healthy.  Coconut oil tends to raise HDL cholesterol more than many other types of fat.  In addition, it may improve the ratio of low-density-lipoprotein (LDL) cholesterol, the "bad" cholesterol, to HDL cholesterol. Improving this ratio reduces heart disease risk (26, 27, 28, 29).  One study examined the health effects of coconut oil on 28 women with excess belly fat. The researchers found that participants who took coconut oil daily experienced increased HDL cholesterol and a lower LDL-to-HDL ratio.  In contrast, the group who took soybean oil daily had a decrease in HDL cholesterol and an increase in the LDL-to-HDL ratio (29).  Most studies have found these health benefits occur at a dosage of about 2 tablespoons (30 ml) of coconut oil per day. It's best to incorporate this into cooking rather than eating spoonfuls of coconut oil on their own.  Bottom line: Consuming 2 tablespoons (30 ml) of coconut oil per day may help increase HDL cholesterol levels.  5. Stop smoking  cigarette butt Quitting smoking can reduce the risk of heart  disease and lung cancer. Smoking increases the risk of many health problems, including heart disease and lung cancer (30).  One of its negative effects is a suppression of HDL cholesterol.  Some studies have found that quitting smoking can increase HDL levels. Indeed, one study found no significant differences in HDL levels between former smokers and people who had never smoked (31, 32, 33, 34, 35).  In a one-year study of more than 1,500 people, those who quit smoking had twice the increase in HDL as those who resumed smoking  within the year. The number of large HDL particles also increased, which further reduced heart disease risk (32).  One study followed smokers who switched from traditional cigarettes to electronic cigarettes for one year. They found that the switch was associated with an increase in HDL cholesterol of 5 mg/dl, on average (33).  When it comes to the effect of nicotine replacement patches on HDL levels, research results have been mixed.  One study found that nicotine replacement therapy led to higher HDL cholesterol. However, other research suggests that people who use nicotine patches likely won't see increases in HDL levels until after replacement therapy is completed (34, 36).  Even in studies where HDL cholesterol levels didn't increase after people quit smoking, HDL function improved, resulting in less inflammation and other beneficial effects on heart health (37).  Bottom line: Quitting smoking can increase HDL levels, improve HDL function and help protect heart health.  6. Lose weight  When overweight and obese people lose weight, their HDL cholesterol levels usually increase.  What's more, this benefit seems to occur whether weight loss is achieved by calorie counting, carb restriction, intermittent fasting, weight loss surgery or a combination of diet and exercise (16, 38, 39, 40, 41, 42).  One study examined HDL levels in more than 3,000 overweight and obese [place] adults who followed a lifestyle modification program for one year.  The researchers found that losing at least 6.6 lbs (3 kg) led to an increase in HDL cholesterol of 4 mg/dl, on average (41).  In another study, when obese people with type 2 diabetes consumed calorie-restricted diets that provided 20-30% of calories from protein, they experienced significant increases in HDL cholesterol levels (42).  The key to achieving and maintaining healthy HDL cholesterol levels is choosing the type of diet that makes it easiest for  you to lose weight and keep it off.  Bottom Line: Several methods of weight loss have been shown to increase HDL cholesterol levels in people who are overweight or obese.  7. Choose purple produce  Consuming purple-colored fruits and vegetables is a delicious way to potentially increase HDL cholesterol.  Purple produce contains antioxidants known as anthocyanins.  Studies using anthocyanin extracts have shown that they help fight inflammation, protect your cells from damaging free radicals and may also raise HDL cholesterol levels (43, 44, 45, 46).  In a 24-week study of 73 people with diabetes, those who took an anthocyanin supplement twice a day experienced a 19% increase in HDL cholesterol, on average, along with other improvements in heart health markers (45).  In another study, when people with cholesterol issues took anthocyanin extract for 12 weeks, their HDL cholesterol levels increased by 13.7% (46).  Although these studies used extracts instead of foods, there are several fruits and vegetables that are very high in anthocyanins. These include eggplant, purple corn, red cabbage, blueberries, blackberries and black raspberries.  Bottom line: Consuming fruits and vegetables rich in anthocyanins may help increase  HDL cholesterol levels.  8. Eat fatty fish often  The omega-3 fats in fatty fish provide major benefits to heart health, including a reduction in inflammation and better functioning of the cells that line your arteries (47, 48).  There's some research showing that eating fatty fish or taking fish oil may also help raise low levels of HDL cholesterol (49, 50, 51, 52, 53).  In a study of 33 heart disease patients, participants that consumed fatty fish four times per week experienced an increase in HDL cholesterol levels. The particle size of their HDL also increased (52).  In another study, overweight men who consumed herring five days a week for six weeks had a 5% increase  in HDL cholesterol, compared with their levels after eating lean pork and chicken five days a week (53).  However, there are a few studies that found no increase in HDL cholesterol in response to increased fish or omega-3 supplement intake (54, 55).  In addition to herring, other types of fatty fish that may help raise HDL cholesterol include salmon, sardines, mackerel and anchovies.  Bottom line: Eating fatty fish several times per week may help increase HDL cholesterol levels and provide other benefits to heart health.  9. Avoid artificial trans fats  Artificial trans fats have many negative health effects due to their inflammatory properties (56, 57).  There are two types of trans fats. One kind occurs naturally in animal products, including full-fat dairy.  In contrast, the artificial trans fats found in margarines and processed foods are created by adding hydrogen to unsaturated vegetable and seed oils. These fats are also known as industrial trans fats or partially hydrogenated fats.  Research has shown that, in addition to increasing inflammation and contributing to several health problems, these artificial trans fats may lower HDL cholesterol levels.  In one study, researchers compared how people's HDL levels responded when they consumed different margarines.  The study found that participants' HDL cholesterol levels were 10% lower after consuming margarine containing partially hydrogenated soybean oil, compared to their levels after consuming palm oil (58).  Another controlled study followed 40 adults who had diets high in different types of trans fats.  They found that HDL cholesterol levels in women were significantly lower after they consumed the diet high in industrial trans fats, compared to the diet containing naturally occurring trans fats (59).  To protect heart health and keep HDL cholesterol in the healthy range, it's best to avoid artificial trans fats  altogether.  Bottom line: Artificial trans fats have been shown to lower HDL levels and increase inflammation, compared to other fats.  Take home message  Although your HDL cholesterol levels are partly determined by your genetics, there are many things you can do to naturally increase your own levels.  Fortunately, the practices that raise HDL cholesterol often provide other health benefits as well.

## 2018-04-18 NOTE — Progress Notes (Signed)
Assessment and plan:  1. Essential hypertension   2. Low HDL (under 40)   3. Elevated LDL cholesterol level   4. On statin therapy   5. Glucose intolerance (impaired glucose tolerance)   6. DDD (degenerative disc disease), lumbar   7. Chronic lumbar radiculopathy     1. Reviewed recent lab work (03/15/2018) in depth with patient today.  All lab work within normal limits unless otherwise noted.  Patient was educated extensively about results of all lab work today.  2. Elevated Blood Pressure on Intake - Reviewed importance of controlling blood pressure. - Ambulatory BP monitoring encouraged. - Discussed goal range with patient today.  3. Kidney Health - Decrease use of Advil and Aleve and other nephrotoxic substances. - Encouraged patient to exercise and hydrate adequately to help protect kidney health.  4. Prediabetes - Elevated Hemoglobin A1c - Down to 5.7 from 5.9 prior.  5. Hyperlipidemia - Patient managed on statin therapy at this time.  Reviewed critical importance of physical activity and adequate hydration while taking statins, to help minimize risk of S-E.  Triglycerides = 197, elevated. HDL = 30, low. - Encouraged increased physical activity to improve HDL value. LDL = 87.  The 10-year ASCVD risk score Douglas Bussing DC Brooke Bonito., et al., 2013) is: 12%   Values used to calculate the score:     Age: 17 years     Sex: Male     Is Non-Hispanic African American: No     Diabetic: No     Tobacco smoker: No     Systolic Blood Pressure: 338 mmHg     Is BP treated: Yes     HDL Cholesterol: 30 mg/dL     Total Cholesterol: 156 mg/dL  - Dietary changes such as low saturated & trans fat and low carb/ ketogenic diets discussed with patient.  Encouraged regular exercise and weight loss when appropriate.   6. Chronic Back Pain - R-sided; lumbar discogenic pain with radiculopathy - Patient not interested in oral pain  medications and is interested in procedures.  Referral placed today.  See orders.  - Had injections with Dr. Sharlet Salina in the past with poor result.  7. Lifestyle & Preventative Health Maintenance - Advised patient to continue working toward exercising to improve overall mental, physical, and emotional health.    - Reviewed the "spokes of the wheel" of mood and health management.  Stressed the importance of ongoing prudent habits, including regular exercise, appropriate sleep hygiene, healthful dietary habits, and prayer/meditation to relax.  - Encouraged patient to engage in daily physical activity, especially a formal exercise routine.  Recommended that the patient eventually strive for at least 150 minutes of moderate cardiovascular activity per week according to guidelines established by the [facility].   - Healthy dietary habits encouraged, including low-carb, and high amounts of lean protein in diet.   - Patient should also consume adequate amounts of water.   Education and routine counseling performed. Handouts provided.  8. Follow-Up - Prescriptions refilled today. - Re-check CMP and fasting lipid profile in December. - Otherwise, continue to return for CPE and chronic follow-up as scheduled.   - Patient knows to call in sooner if desired to address acute concerns.   Orders Placed This Encounter  Procedures  . Comprehensive metabolic panel  . Lipid panel  . ALT  . Ambulatory referral to Pain Clinic    Medications Discontinued During This Encounter  Medication Reason  . ranitidine (ZANTAC) 300 MG  tablet Patient Preference      Return for Lab only and December otherwise 4 months chronic follow-up blood pressure, cholesterol, prediab etc..   Anticipatory guidance and routine counseling done re: condition, txmnt options and need for follow up. All questions of patient's were answered.   Gross side effects, risk and benefits, and alternatives of medications discussed with  patient.  Patient is aware that all medications have potential side effects and we are unable to predict every sideeffect or drug-drug interaction that may occur.  Expresses verbal understanding and consents to current therapy plan and treatment regiment.  Please see AVS handed out to patient at the end of our visit for additional patient instructions/ counseling done pertaining to today's office visit.  Note:  This document was prepared using Dragon voice recognition software and may include unintentional dictation errors.  This document serves as a record of services personally performed by Mellody Dance, DO. It was created on her behalf by Toni Amend, a trained medical scribe. The creation of this record is based on the scribe's personal observations and the provider's statements to them.   I have reviewed the above medical documentation for accuracy and completeness and I concur.  Mellody Dance, DO 04/22/2018 6:47 AM      -------------------------------------------------------------------------------------  Subjective:   CC:   Douglas Barnes is a 62 y.o. male who presents to [place] [facility] today for review and discussion of recent bloodwork that was done in addition to f/up on chronic conditions we are managing for Douglas Barnes.  1. All recent blood work that we ordered was reviewed with patient today.  Patient was counseled on all abnormalities and we discussed dietary and lifestyle changes that could help those values (also medications when appropriate).  Extensive health counseling performed and all patient's concerns/ questions were addressed.  See labs below and also plan for more details of these abnormalities  States he's doing well and feeling good today.  Plan is doing well on Lipitor.  Denies issues.  Patient does not drink alcohol.  He does smoke pot.  Back Pain - R back down to his big toe Notes that he lives in a state of pain most  days, especially with regards to his back pain.  Notes sometimes his pain is so bad that he "just about pees his pants."  Patient saw Dr. Sharlet Salina in the past with regards to his degenerative back disease.  Ambulatory Blood Pressure Monitoring Notes he owns a blood pressure cuff but has not been utilizing it.   Wt Readings from Last 3 Encounters:  04/18/18 201 lb (91.2 kg)  03/13/18 196 lb (88.9 kg)  09/20/17 191 lb 12.8 oz (87 kg)   BP Readings from Last 3 Encounters:  04/18/18 131/89  03/13/18 126/87  09/20/17 114/78   Pulse Readings from Last 3 Encounters:  04/18/18 71  03/13/18 79  09/20/17 96   BMI Readings from Last 3 Encounters:  04/18/18 31.48 kg/m  03/13/18 30.70 kg/m  09/20/17 30.04 kg/m     Patient Care Team    Relationship Specialty Notifications Start End  Mellody Dance, DO PCP - General Family Medicine  02/23/16   Arta Silence, MD Consulting Physician Gastroenterology  02/23/16   Danella Sensing, MD Consulting Physician Dermatology  02/23/16   Festus Aloe, MD Consulting Physician Urology  02/23/16   Sharlet Salina, MD Referring Physician Physical Medicine and Rehabilitation  02/23/16    Comment: lower back/ transformainal injections  Alexis Frock, MD  Consulting Physician Urology  03/13/18     Full medical history updated and reviewed in the office today  Patient Active Problem List   Diagnosis Date Noted  . Elevated LDL cholesterol level 05/31/2017    Priority: High  . Low HDL (under 40) 05/31/2017    Priority: High  . Overweight (BMI 25.0-29.9) 05/30/2016    Priority: High  . HTN (hypertension) 02/23/2016    Priority: High  . GERD (gastroesophageal reflux disease) 05/31/2017    Priority: Medium  . Fatigue 05/31/2017    Priority: Medium  . Hepatitis C infection- s/p harvoni 02/23/2016    Priority: Medium  . History of colonic polyps 02/23/2016    Priority: Medium  . Benign prostatic hyperplasia with lower urinary tract symptoms  07/09/2015    Priority: Medium  . Vitamin D deficiency 05/31/2017    Priority: Low  . Poor diet 05/30/2016    Priority: Low  . Inactivity- not working out and no cardio 05/30/2016    Priority: Low  . History of tobacco use 02/23/2016    Priority: Low  . Hematuria- chronic (followed by Dr Junious Silk urology) 07/11/2015    Priority: Low  . Glucose intolerance (impaired glucose tolerance) 03/13/2018  . Localized swelling, mass or lump of neck 09/20/2017  . Noncompliance with diet and medication regimen 05/31/2017  . DDD (degenerative disc disease), lumbar 12/01/2013  . Lumbar radiculitis 12/01/2013    Past Medical History:  Diagnosis Date  . BPH (benign prostatic hypertrophy) with urinary retention   . Complication of anesthesia    "fear of not waking up"  . Foley catheter in place   . Hepatitis C antibody test positive    dx 1997--  Douglas Barnes is getting treatment in near future (last enzymes 12 /2016 stable per Douglas Barnes and in epic)  . History of colon polyps   . History of hypertension    no medication since 2014 lost weight and decrease stress  . Hypertension     Past Surgical History:  Procedure Laterality Date  . ANAL FISSURE REPAIR    . COLONOSCOPY WITH PROPOFOL  last one 2015  . GREEN LIGHT LASER TURP (TRANSURETHRAL RESECTION OF PROSTATE N/A 07/09/2015   Procedure: GREEN LIGHT LASER TURP (TRANSURETHRAL RESECTION OF PROSTATE;  Surgeon: Festus Aloe, MD;  Location: [facility];  Service: Urology;  Laterality: N/A;  . INGUINAL HERNIA REPAIR Right age 31  . TRANSURETHRAL RESECTION OF PROSTATE N/A 07/09/2015   Procedure: CYSTOSCOPY WITH CLOT EVACUATION //FULGERATION OF BLEEDERS;  Surgeon: Alexis Frock, MD;  Location: [facility];  Service: Urology;  Laterality: N/A;    Social History   Tobacco Use  . Smoking status: Former Smoker    Packs/day: 0.00    Years: 25.00    Pack years: 0.00    Types: Cigarettes  . Smokeless tobacco: Never Used  . Tobacco comment: 5 CIG.  PER DAY  Substance Use Topics  . Alcohol use: No    Family Hx: Family History  Problem Relation Age of Onset  . Cancer Mother 26       BONE  . Cancer Father 14       COLON  . Hypertension Brother      Medications: Current Outpatient Medications  Medication Sig Dispense Refill  . atorvastatin (LIPITOR) 40 MG tablet Take 1 tablet (40 mg total) by mouth daily. 90 tablet 1  . Cholecalciferol (VITAMIN D3) 5000 units TABS 5,000 IU OTC vitamin D3 daily. 90 tablet 3  . finasteride (PROSCAR) 5  MG tablet Take 5 mg by mouth daily.  3  . losartan-hydrochlorothiazide (HYZAAR) 100-25 MG tablet Take 1 tablet by mouth daily. Patient needs office visit for further refills. MPulliam, CMA/RT(R) 90 tablet 1  . omeprazole (PRILOSEC) 20 MG capsule Take 1 capsule (20 mg total) by mouth daily. Patient needs office visit for further refills 90 capsule 1  . Vitamin D, Ergocalciferol, (DRISDOL) 50000 units CAPS capsule TAKE 1 CAPSULE (50,000 UNITS TOTAL) BY MOUTH EVERY 7 (SEVEN) DAYS. 12 capsule 1   No current facility-administered medications for this visit.     Allergies:  No Known Allergies   Review of Systems: General:   No F/C, wt loss Pulm:   No DIB, SOB, pleuritic chest pain Card:  No CP, palpitations Abd:  No n/v/d or pain Ext:  No inc edema from baseline  Objective:  Blood pressure 131/89, pulse 71, temperature 97.8 F (36.6 C), height 5\' 7"  (1.702 m), weight 201 lb (91.2 kg), SpO2 99 %. Body mass index is 31.48 kg/m. Gen:   Well NAD, A and O *3 HEENT:    [place]/AT, EOMI,  MMM Lungs:   Normal work of breathing. CTA B/L, no Wh, rhonchi Heart:   RRR, S1, S2 WNL's, no MRG Abd:   No gross distention Exts:    warm, pink,  Brisk capillary refill, warm and well perfused.  Psych:    No HI/SI, judgement and insight good, Euthymic mood. Full Affect.   Recent Results (from the past 2160 hour(s))  VITAMIN D 25 Hydroxy (Vit-D Deficiency, Fractures)     Status: None   Collection Time: 03/15/18   9:14 AM  Result Value Ref Range   Vit D, 25-Hydroxy 51.2 30.0 - 100.0 ng/mL    Comment: Vitamin D deficiency has been defined by the [place] practice guideline as a level of serum 25-OH vitamin D less than 20 ng/mL (1,2). The Endocrine Society went on to further define vitamin D insufficiency as a level between 21 and 29 ng/mL (2). 1. IOM (Institute of Medicine). 2010. Dietary reference    intakes for calcium and D. [place]: The    [employer]. 2. Holick MF, Binkley [place], Bischoff-Ferrari HA, et al.    Evaluation, treatment, and prevention of vitamin D    deficiency: an Endocrine Society clinical practice    guideline. JCEM. 2011 Jul; 96(7):1911-30.   Lipid panel     Status: Abnormal   Collection Time: 03/15/18  9:14 AM  Result Value Ref Range   Cholesterol, Total 156 100 - 199 mg/dL   Triglycerides 197 (H) 0 - 149 mg/dL   HDL 30 (L) >39 mg/dL   VLDL Cholesterol Cal 39 5 - 40 mg/dL   LDL Calculated 87 0 - 99 mg/dL   Chol/HDL Ratio 5.2 (H) 0.0 - 5.0 ratio    Comment:                                   T. Chol/HDL Ratio                                             Men  Women  1/2 Avg.Risk  3.4    3.3                                   Avg.Risk  5.0    4.4                                2X Avg.Risk  9.6    7.1                                3X Avg.Risk 23.4   11.0   Hemoglobin A1c     Status: Abnormal   Collection Time: 03/15/18  9:14 AM  Result Value Ref Range   Hgb A1c MFr Bld 5.7 (H) 4.8 - 5.6 %    Comment:          Prediabetes: 5.7 - 6.4          Diabetes: >6.4          Glycemic control for adults with diabetes: <7.0    Est. average glucose Bld gHb Est-mCnc 117 mg/dL  Comprehensive metabolic panel     Status: Abnormal   Collection Time: 03/15/18  9:14 AM  Result Value Ref Range   Glucose 126 (H) 65 - 99 mg/dL   BUN 13 8 - 27 mg/dL   Creatinine, Ser 1.05 0.76 - 1.27 mg/dL   GFR calc non Af  Amer 77 >59 mL/min/1.73   GFR calc Af Amer 89 >59 mL/min/1.73   BUN/Creatinine Ratio 12 10 - 24   Sodium 138 134 - 144 mmol/L   Potassium 3.7 3.5 - 5.2 mmol/L   Chloride 96 96 - 106 mmol/L   CO2 26 20 - 29 mmol/L   Calcium 9.4 8.6 - 10.2 mg/dL   Total Protein 7.5 6.0 - 8.5 g/dL   Albumin 4.7 3.6 - 4.8 g/dL   Globulin, Total 2.8 1.5 - 4.5 g/dL   Albumin/Globulin Ratio 1.7 1.2 - 2.2   Bilirubin Total 1.1 0.0 - 1.2 mg/dL   Alkaline Phosphatase 72 39 - 117 IU/L   AST 18 0 - 40 IU/L   ALT 16 0 - 44 IU/L

## 2018-05-08 ENCOUNTER — Telehealth: Payer: Self-pay | Admitting: Family Medicine

## 2018-05-08 ENCOUNTER — Other Ambulatory Visit: Payer: BLUE CROSS/BLUE SHIELD

## 2018-05-08 DIAGNOSIS — E786 Lipoprotein deficiency: Secondary | ICD-10-CM

## 2018-05-08 DIAGNOSIS — Z79899 Other long term (current) drug therapy: Secondary | ICD-10-CM

## 2018-05-08 DIAGNOSIS — E7800 Pure hypercholesterolemia, unspecified: Secondary | ICD-10-CM

## 2018-05-08 DIAGNOSIS — M5136 Other intervertebral disc degeneration, lumbar region: Secondary | ICD-10-CM

## 2018-05-08 DIAGNOSIS — I10 Essential (primary) hypertension: Secondary | ICD-10-CM | POA: Diagnosis not present

## 2018-05-08 DIAGNOSIS — M5416 Radiculopathy, lumbar region: Secondary | ICD-10-CM

## 2018-05-08 DIAGNOSIS — R7302 Impaired glucose tolerance (oral): Secondary | ICD-10-CM

## 2018-05-08 NOTE — Telephone Encounter (Signed)
[place] rep states (Pt ) has been called 2xs but not response-- messages left for him to call them , no response so no appointment has been made .  --forwarding message to [place]

## 2018-05-09 LAB — COMPREHENSIVE METABOLIC PANEL
ALBUMIN: 4.2 g/dL (ref 3.6–4.8)
ALT: 11 IU/L (ref 0–44)
AST: 13 IU/L (ref 0–40)
Albumin/Globulin Ratio: 1.6 (ref 1.2–2.2)
Alkaline Phosphatase: 56 IU/L (ref 39–117)
BUN/Creatinine Ratio: 17 (ref 10–24)
BUN: 16 mg/dL (ref 8–27)
Bilirubin Total: 0.9 mg/dL (ref 0.0–1.2)
CO2: 25 mmol/L (ref 20–29)
Calcium: 9.2 mg/dL (ref 8.6–10.2)
Chloride: 101 mmol/L (ref 96–106)
Creatinine, Ser: 0.94 mg/dL (ref 0.76–1.27)
GFR calc Af Amer: 101 mL/min/{1.73_m2} (ref >59)
GFR calc non Af Amer: 88 mL/min/{1.73_m2} (ref >59)
Globulin, Total: 2.7 g/dL (ref 1.5–4.5)
Glucose: 116 mg/dL — ABNORMAL HIGH (ref 65–99)
Potassium: 4.1 mmol/L (ref 3.5–5.2)
Sodium: 143 mmol/L (ref 134–144)
Total Protein: 6.9 g/dL (ref 6.0–8.5)

## 2018-05-09 LAB — LIPID PANEL
Chol/HDL Ratio: 3.2 ratio (ref 0.0–5.0)
Cholesterol, Total: 98 mg/dL — ABNORMAL LOW (ref 100–199)
HDL: 31 mg/dL — ABNORMAL LOW (ref >39)
LDL Calculated: 48 mg/dL (ref 0–99)
Triglycerides: 94 mg/dL (ref 0–149)
VLDL Cholesterol Cal: 19 mg/dL (ref 5–40)

## 2018-05-13 DIAGNOSIS — C61 Malignant neoplasm of prostate: Secondary | ICD-10-CM | POA: Diagnosis not present

## 2018-05-20 DIAGNOSIS — C61 Malignant neoplasm of prostate: Secondary | ICD-10-CM | POA: Diagnosis not present

## 2018-05-20 DIAGNOSIS — R310 Gross hematuria: Secondary | ICD-10-CM | POA: Diagnosis not present

## 2018-05-20 DIAGNOSIS — N5201 Erectile dysfunction due to arterial insufficiency: Secondary | ICD-10-CM | POA: Diagnosis not present

## 2018-05-20 DIAGNOSIS — R3912 Poor urinary stream: Secondary | ICD-10-CM | POA: Diagnosis not present

## 2018-07-02 ENCOUNTER — Other Ambulatory Visit: Payer: Self-pay | Admitting: Family Medicine

## 2018-07-24 DIAGNOSIS — D2261 Melanocytic nevi of right upper limb, including shoulder: Secondary | ICD-10-CM | POA: Diagnosis not present

## 2018-07-24 DIAGNOSIS — D2262 Melanocytic nevi of left upper limb, including shoulder: Secondary | ICD-10-CM | POA: Diagnosis not present

## 2018-07-24 DIAGNOSIS — D225 Melanocytic nevi of trunk: Secondary | ICD-10-CM | POA: Diagnosis not present

## 2018-07-24 DIAGNOSIS — L820 Inflamed seborrheic keratosis: Secondary | ICD-10-CM | POA: Diagnosis not present

## 2018-07-24 DIAGNOSIS — D1801 Hemangioma of skin and subcutaneous tissue: Secondary | ICD-10-CM | POA: Diagnosis not present

## 2018-08-12 ENCOUNTER — Ambulatory Visit: Payer: BLUE CROSS/BLUE SHIELD | Admitting: Family Medicine

## 2018-08-21 ENCOUNTER — Ambulatory Visit: Payer: BLUE CROSS/BLUE SHIELD | Admitting: Family Medicine

## 2018-08-29 ENCOUNTER — Other Ambulatory Visit: Payer: Self-pay | Admitting: Family Medicine

## 2018-08-29 DIAGNOSIS — K219 Gastro-esophageal reflux disease without esophagitis: Secondary | ICD-10-CM

## 2018-09-05 ENCOUNTER — Other Ambulatory Visit: Payer: Self-pay | Admitting: Urology

## 2018-09-05 DIAGNOSIS — C61 Malignant neoplasm of prostate: Secondary | ICD-10-CM

## 2018-09-11 ENCOUNTER — Other Ambulatory Visit: Payer: Self-pay | Admitting: Family Medicine

## 2018-09-11 DIAGNOSIS — E786 Lipoprotein deficiency: Secondary | ICD-10-CM

## 2018-09-11 DIAGNOSIS — I10 Essential (primary) hypertension: Secondary | ICD-10-CM

## 2018-09-11 DIAGNOSIS — E7800 Pure hypercholesterolemia, unspecified: Secondary | ICD-10-CM

## 2018-09-24 ENCOUNTER — Encounter: Payer: Self-pay | Admitting: Family Medicine

## 2018-09-24 ENCOUNTER — Other Ambulatory Visit: Payer: Self-pay

## 2018-09-24 ENCOUNTER — Ambulatory Visit (INDEPENDENT_AMBULATORY_CARE_PROVIDER_SITE_OTHER): Payer: BLUE CROSS/BLUE SHIELD | Admitting: Family Medicine

## 2018-09-24 VITALS — BP 127/79 | HR 84 | Temp 98.1°F | Ht 67.0 in | Wt 198.0 lb

## 2018-09-24 DIAGNOSIS — I159 Secondary hypertension, unspecified: Secondary | ICD-10-CM

## 2018-09-24 DIAGNOSIS — R7302 Impaired glucose tolerance (oral): Secondary | ICD-10-CM

## 2018-09-24 DIAGNOSIS — B182 Chronic viral hepatitis C: Secondary | ICD-10-CM

## 2018-09-24 DIAGNOSIS — E65 Localized adiposity: Secondary | ICD-10-CM

## 2018-09-24 DIAGNOSIS — E639 Nutritional deficiency, unspecified: Secondary | ICD-10-CM

## 2018-09-24 DIAGNOSIS — E786 Lipoprotein deficiency: Secondary | ICD-10-CM

## 2018-09-24 DIAGNOSIS — N401 Enlarged prostate with lower urinary tract symptoms: Secondary | ICD-10-CM

## 2018-09-24 DIAGNOSIS — K219 Gastro-esophageal reflux disease without esophagitis: Secondary | ICD-10-CM

## 2018-09-24 DIAGNOSIS — E7800 Pure hypercholesterolemia, unspecified: Secondary | ICD-10-CM

## 2018-09-24 DIAGNOSIS — Z723 Lack of physical exercise: Secondary | ICD-10-CM

## 2018-09-24 DIAGNOSIS — R310 Gross hematuria: Secondary | ICD-10-CM

## 2018-09-24 NOTE — Progress Notes (Signed)
Virtual / live video office visit note for [employer], D.O- Primary Care Physician at [facility]   I connected with current patient today and beyond visually recognizing the correct individual, I verified that I am speaking with the correct person using two identifiers.  . Location of the patient: Home . Location of the provider: home Only the patient (+/- their family members at pt's discretion) and myself were participating in the encounter    - This visit type was conducted due to national recommendations for restrictions regarding the COVID-19 Pandemic (e.g. social distancing) in an effort to limit this patient's exposure and mitigate transmission in our community.  This format is felt to be most appropriate for this patient at this time.   - The patient did have access to video technology today   - No physical exam could be performed with this format, beyond that communicated to [place] by the patient/ family members as noted.   - Additionally my office staff/ schedulers discussed with the patient that there may be a monetary charge related to this service, depending on patient's medical insurance.   The patient expressed understanding, and agreed to proceed.      History of Present Illness:  Hypertension: Well controlled at home, running consistently in the 120s over 31s.  Tolerating meds well.  No complaints or symptoms today.  Patient with a history of BPH and elevated PSA.  Seen by Dr Tresa Moore- urology.   PSA was over 7.0 in the fall per patient but now most recently after starting medications from Dr. Tresa Moore,  Dec 05-13-18 his PSA came down to 2.9.  Patient is very happy about this and enthusiastic about telling me  Seasonal allergies-patient typically gets very bad seasonal allergies.  He wears a cover over his face eyes and ears whenever he is outside mowing this time a year.  They have been a little worse than his usual this time.  Prior he had been on Zyrtec however Zyrtec  made him sleepy.    Recently patient started claritin.  Doing well thus far.  He has never done Flonase or sinus rinses and had several questions about this today.  HLD:  -Back in 03/15/2018 we started patient on 40 mg nightly of Lipitor.  At that time his ASCVD risk was over 12%. -He has been on the medicine since and states he is tolerating it well without side effect.  He can tell if he does not drink enough water then he can feel headachy and achy all over-otherwise when he drinks enough water he has no complaints. - Patient reports good compliance with medications or treatment plan-on Lipitor 40.   Tolerating well.  Repeat ALT was all within normal limits.  All of these labs are reviewed with patient LDL less than 50 now.  Was over 110 prior  - Denies medication S-E   - Smoking Status noted -negative currently  - He denies new onset of: chest pain, exercise intolerance, shortness of breath, dizziness, visual changes, headache, lower extremity swelling or claudication.   no myalgias, when he drinks adequate water  The cholesterol last visit was:  Lab Results  Component Value Date   CHOL 98 (L) 05/08/2018   HDL 31 (L) 05/08/2018   LDLCALC 48 05/08/2018   TRIG 94 05/08/2018   CHOLHDL 3.2 05/08/2018    Hepatic Function Latest Ref Rng & Units 05/08/2018 03/15/2018 05/31/2017  Total Protein 6.0 - 8.5 g/dL 6.9 7.5 7.8  Albumin 3.6 - 4.8 g/dL 4.2 4.7 4.6  AST 0 - 40 IU/L 13 18 15   ALT 0 - 44 IU/L 11 16 14   Alk Phosphatase 39 - 117 IU/L 56 72 63  Total Bilirubin 0.0 - 1.2 mg/dL 0.9 1.1 1.0       Impression and Recommendations:    1. Secondary hypertension Patient blood pressure well controlled, recently refilled medications about a couple of weeks ago.  Continue current medicines. -Encouraged patient to continue his home monitoring about 2-3 times a week as he has been. -Encouraged exercise and low-salt diet  2. Elevated LDL cholesterol level Continue Lipitor 40.  Dietary  changes such as low saturated & trans fat and low carb/ ketogenic diets discussed with patient.  Encouraged regular exercise and weight loss when appropriate.   Educational handouts provided at patient's desire.  Continue current medication(s).   Also, risks and benefits of medications discussed with patient, including alternative treatments.     3. Low HDL (under 40) Discussed with patient that he should increase his cardio activity which he is doing essentially none of right now. -Educated him on healthy omega 3 and sixes.  Also educated on increase activity will increase HDL.  4. Obese abdomen Patient did gain weight recently. -BMI counseling done now it is over 30. -Recommended exercise of 30 to 60 minutes daily.  He has a bike and plans on using it -Also discussed my fitness pal and lose it app to track his meals. -In general encouraged eating less of the bad things and increase the healthier items and move more.  5. Glucose intolerance (impaired glucose tolerance) Last checked was 5.7 prior was 5.9 about a year ago. -Educated patient on increasing weight typically increases chance of diabetes. -We will recheck in another 4 months or so.  6. Chronic hepatitis C without hepatic coma ([place]) Reviewed ALT and AST and everything within normal limits, despite him being on statin. -Patient very happy to hear this.  7. Gastroesophageal reflux disease, esophagitis presence not specified Well-controlled on current regiment.  Continue medicines and advised against excess caffeine etc.  8. Benign prostatic hyperplasia with lower urinary tract symptoms, symptom details unspecified Patient will continue with Dr. May he goes about every 4 to 6 months 9. Gross hematuria Resolved-no longer with symptoms currently.  --> - Novel Covid -19 counseling done; all questions were answered.   - Current CDC and federal guidelines reviewed with patient  - Reminded pt of extreme importance of social  distancing; minimizing contacts with others, avoiding ALL but emergency appts etc. - Told patient to be prepared, not scared; and be smart for the sake of others - told to call with any concerns  10. Inactivity- not working out and no cardio 30-60 minutes daily is his goal that he will gradually work up to.  11. Poor diet -Health counseling done. - Novel Covid -19 counseling done; all questions were answered.   - Current CDC and federal guidelines reviewed with patient  - Reminded pt of extreme importance of social distancing; minimizing contacts with others, avoiding ALL but emergency appts etc. - Told patient to be prepared, not scared; and be smart for the sake of others - told to call with any concerns  -Also counseled patient that he needs his zoster vaccine which is a series of 2 shots etc. etc.  - As part of my medical decision making, I reviewed the following data within the [place] History obtained from pt /family,  CMA notes reviewed and incorporated if applicable, Labs reviewed, Radiograph/ tests reviewed if applicable and OV notes from prior OV's with me, as well as other specialists she/he has seen since seeing me last, were all reviewed and used in my medical decision making process today.   - Additionally, discussion had with patient regarding txmnt plan, their biases about that plan etc were used in my medical decision making today.   - The patient agreed with the plan and demonstrated an understanding of the instructions.   No barriers to understanding were identified.   - Red flag symptoms and signs discussed in detail.  Patient expressed understanding regarding what to do in case of emergency\ urgent symptoms.  The patient was advised to call back or seek an in-person evaluation if the symptoms worsen or if the condition fails to improve as anticipated.   Return for 64mo BP, chol, inactiv, wt loss. A1c, healthy eating etc.   I provided 27+ minutes of  non-face-to-face time during this encounter,with over 50% of the time in direct counseling on patients medical conditions/ medical concerns.  Additional time was spent with charting and coordination of care after the actual visit commenced.   Note:  This note was prepared with assistance of Dragon voice recognition software. Occasional wrong-word or sound-a-like substitutions may have occurred due to the inherent limitations of voice recognition software.  DMellody Dance DO     Patient Care Team    Relationship Specialty Notifications Start End  OMellody Dance DO PCP - General Family Medicine  02/23/16   OArta Silence MD Consulting Physician Gastroenterology  02/23/16   JDanella Sensing MD Consulting Physician Dermatology  02/23/16   EFestus Aloe MD Consulting Physician Urology  02/23/16   CSharlet Salina MD Referring Physician Physical Medicine and Rehabilitation  02/23/16    Comment: lower back/ transformainal injections  MAlexis Frock MD Consulting Physician Urology  03/13/18     -Vitals obtained; medications/ allergies reconciled;  personal medical, social, Sx etc.histories were updated by CMA, reviewed by me and are reflected in chart  Patient Active Problem List   Diagnosis Date Noted  . Glucose intolerance (impaired glucose tolerance) 03/13/2018    Priority: High  . Elevated LDL cholesterol level 05/31/2017    Priority: High  . Low HDL (under 40) 05/31/2017    Priority: High  . Obese abdomen 05/30/2016    Priority: High  . HTN (hypertension) 02/23/2016    Priority: High  . GERD (gastroesophageal reflux disease) 05/31/2017    Priority: Medium  . Fatigue 05/31/2017    Priority: Medium  . Hepatitis C infection- s/p harvoni 02/23/2016    Priority: Medium  . History of colonic polyps 02/23/2016    Priority: Medium  . Benign prostatic hyperplasia with lower urinary tract symptoms 07/09/2015    Priority: Medium  . Vitamin D deficiency 05/31/2017    Priority:  Low  . Poor diet 05/30/2016    Priority: Low  . Inactivity- not working out and no cardio 05/30/2016    Priority: Low  . History of tobacco use 02/23/2016    Priority: Low  . Hematuria- chronic (followed by Dr EJunious Sil[facility]rology) 07/11/2015    Priority: Low  . Localized swelling, mass or lump of neck 09/20/2017  . Noncompliance with diet and medication regimen 05/31/2017  . DDD (degenerative disc disease), lumbar 12/01/2013  . Lumbar radiculitis 12/01/2013     Current Meds  Medication Sig  . atorvastatin (LIPITOR) 40 MG tablet TAKE 1 TABLET BY MOUTH EVERY DAY  .  Cholecalciferol (VITAMIN D3) 5000 units TABS 5,000 IU OTC vitamin D3 daily.  . finasteride (PROSCAR) 5 MG tablet Take 5 mg by mouth daily.  Marland Kitchen loratadine (CLARITIN) 10 MG tablet Take 10 mg by mouth daily.  Marland Kitchen losartan-hydrochlorothiazide (HYZAAR) 100-25 MG tablet Take 1 tablet by mouth daily.  Marland Kitchen omeprazole (PRILOSEC) 20 MG capsule Take 1 capsule (20 mg total) by mouth daily.  . Vitamin D, Ergocalciferol, (DRISDOL) 1.25 MG (50000 UT) CAPS capsule TAKE 1 CAPSULE (50,000 UNITS TOTAL) BY MOUTH EVERY 7 (SEVEN) DAYS.     No Known Allergies   ROS:  See above HPI for pertinent positives and negatives   Objective:   Blood pressure 127/79, pulse 84, temperature 98.1 F (36.7 C), height 5' 7"  (1.702 m), weight 198 lb (89.8 kg).  (if some vitals are omitted, this means that patient was UNABLE to obtain them even though they were asked to get them prior to OV today.  They were asked to call us at their earliest convenience with these once obtained.)  General: A & O * 3; visually in no acute distress; in usual state of health.  Skin: Visible skin appears normal and pt's usual skin color HEENT:  EOMI, head is normocephalic and atraumatic.  Sclera are anicteric. Neck has a good range of motion.  Lips are noncyanotic Chest: normal chest excursion and movement Respiratory: speaking in full sentences, no conversational dyspnea; no use of  accessory muscles Psych: insight good, mood- appears full

## 2018-11-20 DIAGNOSIS — C61 Malignant neoplasm of prostate: Secondary | ICD-10-CM | POA: Diagnosis not present

## 2018-11-25 ENCOUNTER — Other Ambulatory Visit: Payer: Self-pay

## 2018-11-25 ENCOUNTER — Ambulatory Visit
Admission: RE | Admit: 2018-11-25 | Discharge: 2018-11-25 | Disposition: A | Discharge status: Home / Self Care | Payer: BC Managed Care – PPO | Source: Ambulatory Visit | Attending: Urology | Admitting: Urology

## 2018-11-25 DIAGNOSIS — N4289 Other specified disorders of prostate: Secondary | ICD-10-CM | POA: Diagnosis not present

## 2018-11-25 DIAGNOSIS — C61 Malignant neoplasm of prostate: Secondary | ICD-10-CM

## 2018-11-25 MED ORDER — GADOBENATE DIMEGLUMINE 529 MG/ML IV SOLN
18.0000 mL | Freq: Once | INTRAVENOUS | Status: AC | PRN
  Administered 2018-11-25: 18 mL via INTRAVENOUS

## 2018-12-02 DIAGNOSIS — R3912 Poor urinary stream: Secondary | ICD-10-CM | POA: Diagnosis not present

## 2018-12-02 DIAGNOSIS — C61 Malignant neoplasm of prostate: Secondary | ICD-10-CM | POA: Diagnosis not present

## 2018-12-02 DIAGNOSIS — N5201 Erectile dysfunction due to arterial insufficiency: Secondary | ICD-10-CM | POA: Diagnosis not present

## 2018-12-02 DIAGNOSIS — R310 Gross hematuria: Secondary | ICD-10-CM | POA: Diagnosis not present

## 2018-12-03 ENCOUNTER — Other Ambulatory Visit: Payer: Self-pay | Admitting: Family Medicine

## 2018-12-03 DIAGNOSIS — E7800 Pure hypercholesterolemia, unspecified: Secondary | ICD-10-CM

## 2018-12-03 DIAGNOSIS — I10 Essential (primary) hypertension: Secondary | ICD-10-CM

## 2018-12-03 DIAGNOSIS — E786 Lipoprotein deficiency: Secondary | ICD-10-CM

## 2018-12-12 ENCOUNTER — Encounter: Payer: Self-pay | Admitting: Family Medicine

## 2018-12-22 ENCOUNTER — Other Ambulatory Visit: Payer: Self-pay | Admitting: Family Medicine

## 2019-02-18 ENCOUNTER — Telehealth: Payer: Self-pay

## 2019-02-18 ENCOUNTER — Other Ambulatory Visit: Payer: Self-pay | Admitting: Family Medicine

## 2019-02-18 DIAGNOSIS — E786 Lipoprotein deficiency: Secondary | ICD-10-CM

## 2019-02-18 DIAGNOSIS — K219 Gastro-esophageal reflux disease without esophagitis: Secondary | ICD-10-CM

## 2019-02-18 DIAGNOSIS — I10 Essential (primary) hypertension: Secondary | ICD-10-CM

## 2019-02-18 DIAGNOSIS — E7800 Pure hypercholesterolemia, unspecified: Secondary | ICD-10-CM

## 2019-02-18 NOTE — Telephone Encounter (Signed)
FYI

## 2019-02-18 NOTE — Telephone Encounter (Signed)
Please call pt to schedule appt with Dr. Jenetta Downer.   OFFICE VISIT REQUIRED PRIOR TO ANY FURTHER REFILLS  Charyl Bigger, CMA

## 2019-02-19 ENCOUNTER — Other Ambulatory Visit: Payer: Self-pay | Admitting: Family Medicine

## 2019-03-14 ENCOUNTER — Other Ambulatory Visit: Payer: Self-pay | Admitting: Family Medicine

## 2019-03-14 ENCOUNTER — Ambulatory Visit (INDEPENDENT_AMBULATORY_CARE_PROVIDER_SITE_OTHER): Payer: BC Managed Care – PPO | Admitting: Family Medicine

## 2019-03-14 ENCOUNTER — Encounter: Payer: Self-pay | Admitting: Family Medicine

## 2019-03-14 DIAGNOSIS — E559 Vitamin D deficiency, unspecified: Secondary | ICD-10-CM

## 2019-03-14 DIAGNOSIS — Z87891 Personal history of nicotine dependence: Secondary | ICD-10-CM

## 2019-03-14 DIAGNOSIS — F410 Panic disorder [episodic paroxysmal anxiety] without agoraphobia: Secondary | ICD-10-CM | POA: Insufficient documentation

## 2019-03-14 DIAGNOSIS — R7302 Impaired glucose tolerance (oral): Secondary | ICD-10-CM

## 2019-03-14 DIAGNOSIS — I10 Essential (primary) hypertension: Secondary | ICD-10-CM | POA: Diagnosis not present

## 2019-03-14 DIAGNOSIS — K219 Gastro-esophageal reflux disease without esophagitis: Secondary | ICD-10-CM | POA: Diagnosis not present

## 2019-03-14 DIAGNOSIS — E786 Lipoprotein deficiency: Secondary | ICD-10-CM

## 2019-03-14 DIAGNOSIS — E7800 Pure hypercholesterolemia, unspecified: Secondary | ICD-10-CM | POA: Diagnosis not present

## 2019-03-14 DIAGNOSIS — F419 Anxiety disorder, unspecified: Secondary | ICD-10-CM | POA: Insufficient documentation

## 2019-03-14 DIAGNOSIS — E639 Nutritional deficiency, unspecified: Secondary | ICD-10-CM

## 2019-03-14 DIAGNOSIS — R42 Dizziness and giddiness: Secondary | ICD-10-CM | POA: Insufficient documentation

## 2019-03-14 DIAGNOSIS — Z79899 Other long term (current) drug therapy: Secondary | ICD-10-CM | POA: Insufficient documentation

## 2019-03-14 DIAGNOSIS — R200 Anesthesia of skin: Secondary | ICD-10-CM | POA: Insufficient documentation

## 2019-03-14 DIAGNOSIS — R5383 Other fatigue: Secondary | ICD-10-CM

## 2019-03-14 MED ORDER — LOSARTAN POTASSIUM-HCTZ 100-25 MG PO TABS: 1.0000 | ORAL_TABLET | Freq: Every day | ORAL | 0 refills | Status: DC

## 2019-03-14 MED ORDER — OMEPRAZOLE 20 MG PO CPDR: 20.0000 mg | DELAYED_RELEASE_CAPSULE | Freq: Every day | ORAL | 0 refills | Status: DC

## 2019-03-14 MED ORDER — AMLODIPINE BESYLATE 10 MG PO TABS: ORAL_TABLET | ORAL | 0 refills | Status: DC

## 2019-03-14 MED ORDER — ATORVASTATIN CALCIUM 80 MG PO TABS: 40.0000 mg | ORAL_TABLET | Freq: Every day | ORAL | 0 refills | Status: DC

## 2019-03-14 MED ORDER — FAMOTIDINE 20 MG PO TABS: 20.0000 mg | ORAL_TABLET | Freq: Two times a day (BID) | ORAL | 5 refills | Status: DC

## 2019-03-14 NOTE — Progress Notes (Signed)
Virtual / live video office visit note for [employer], D.O- Primary Care Physician at [facility]   I connected with current patient today and beyond visually recognizing the correct individual, I verified that I am speaking with the correct person using two identifiers.  . Location of the patient: Home . Location of the provider: Office Only the patient (+/- their family members at pt's discretion) and myself were participating in the encounter    - This visit type was conducted due to national recommendations for restrictions regarding the COVID-19 Pandemic (e.g. social distancing) in an effort to limit this patient's exposure and mitigate transmission in our community.  This format is felt to be most appropriate for this patient at this time.   - The patient did have access to video technology today  - No physical exam could be performed with this format, beyond that communicated to [place] by the patient/ family members as noted.   - Additionally my office staff/ schedulers discussed with the patient that there may be a monetary charge related to this service, depending on patient's medical insurance.   The patient expressed understanding, and agreed to proceed.      History of Present Illness:  Notes he's been good.  - Recent Acute Episode of Dizziness, Numbness Last Friday (one week ago) he was in his recliner hanging up from a phone call, and "all of a sudden, my waist up, I started going numb and getting real warm, and I couldn't talk."  "it felt like an out of body experience."   His heart was racing.  Notes "I stood up because I thought I was going to fall out."  He got a little nauseous, went to the kitchen to brace himself, and "the whole time my wife was putting the blood pressure cuff on."   - notes that his BP was around 195/56 with 103 heart rate- but pt was moving around at that time.  - Notes he did not feel like the BP measurement was correct because he felt the blood  pressure cuff was not on correctly.  "Long story short, after I could talk, I was like just give me a minute and let me sit here and calm down."  Wife says his blood pressure even afterward "was still a little high."  Patient continues to state "I don't know what all that was about."  Denies other sx at that time: denies chest pain, denies SOB, denies headache or visual change.   Notes he felt nauseous during the event no vomit.  Confirms that he did not display any cardiac red flag sx.  Patient states the event lasted "maybe six minutes", and wife says it lasted a few minutes longer.  Made him sit on couch for an hour b-4 she would let him up. He didn't go to the emergency room because "nothing was going on except for being dizzy."    Says the ER is a joke and "I'm not going to wait around" when it's "worthless."  He hates going to the hospital/ ED  Wife states it's the second time an event like this has happened.  "He got the same feeling of being disoriented, numbness; the same things in past."   Wife says the other time occurred probably two weeks or more prior.  Lasts just a few seconds.  Pt states again "it scared me because I couldn't control it." - Pt denies any residual effects and has felt completely  normal since.  He is able to carry heavy things w/o sx, do lawn and house chores w/o difficulty etc.   - 30+ Pack-Year History of Smoking Says he stopped smoking since he's been following up at the clinic.  Smoked for about 34 years at a pack a day, and quit about five years ago. -  But pt has continued to smoke pot daily   - GERD Says his omeprazole isn't as effective as it was 3-4 years ago when he first started taking it.  Notes now when he eats something during the day, "it comes back to haunt me."  Notes last time he had a horrible episode of reflux, it was after eating split pea soup, which he didn't expect.  These sx have never came on with exertion and sx W with lying down flat.   Has to prop self up at times- which helps per pt. Denies any associated sx.    - HTN:  Blood Pressure at Home States this morning, his BP was 158/98, with a pulse of 87. Wife and patient say his BP has been in the 150's/90's for a while.  Per wife, who interjects frequently today, states "it's been that high one for a long time, Dr. Jenetta Downer."    Thinks his high BP started after April, "one month after his place of business closed and all the crap" with the pandemic.   Pt last seen by [place] on 08/2018 with BP at that time reportedly 120's/ 70's, Stated then that they were "consistently running in the 120's/70's".   Pt was told to f/up in 4 mo or sooner if Bp not at goal or acute concerns arise.   Says "I'm doing everything I'm supposed to be doing, I'm eating right, I don't know."  Continues taking Losartan-HCTZ for BP.     Vit D Def: Taking Vitamin D and states he has been taking everything in the mornings, including his statin.   HLD:  -Back in 03/15/2018 we started patient on 40 mg nightly of Lipitor.  At that time his ASCVD risk was over 12%. - Patient reports good compliance treatment plan-on Lipitor 40.    - Tolerating well.  -  LDL was 113, 114, now less than 50 most recent labs less than 1 yr ago 05/09/19.    - Mood Says he has been on Valium, Xanax in the past and "messed up my psyche."   Says "I just made a pact a long time ago that I have to stay away from those medications because they make me crazy."   States he does not want to take "antipsychotics." And tells me "those are for crazy people and Dr Jenetta Downer, I'm not crazy." -> Notes during the pandemic, he's been more stressed than usual.  Says "I'm angry most of the time and I can't stop cussing."   - Tells me later that he thinks the dizziness episodes may be from anxiety/ stress as well.  Tells me he can get himself "all worked up easily"    Depression screen [facility] 2/9 03/14/2019 09/24/2018 04/18/2018 03/13/2018 09/20/2017  Decreased  Interest 0 0 0 0 0  Down, Depressed, Hopeless 0 0 0 0 0  PHQ - 2 Score 0 0 0 0 0  Altered sleeping 0 0 0 0 0  Tired, decreased energy 0 3 1 0 0  Change in appetite 0 0 0 0 0  Feeling bad or failure about yourself  0 0 0 0 0  Trouble  concentrating 0 0 0 0 0  Moving slowly or fidgety/restless 0 0 0 0 0  Suicidal thoughts 0 0 0 0 0  PHQ-9 Score 0 3 1 0 0  Difficult doing work/chores Not difficult at all Not difficult at all Not difficult at all Not difficult at all Not difficult at all       Impression and Recommendations:    1. Essential hypertension   2. Elevated LDL cholesterol level   3. Low HDL (under 40)   4. Gastroesophageal reflux disease, unspecified whether esophagitis present   5. History of tobacco use   6. Episode of dizziness   7. Numbness   8. Poor diet   9. Vitamin D deficiency   10. On statin therapy   11. Glucose intolerance (impaired glucose tolerance)   12. Fatigue, unspecified type   13. Anxiety     Acute Episode of Dizziness, Numbness - Discussed need for further workup- explained this could be neurologic- such as TIA's or something and also I feel he needs to see Cards for eval as well with stress test to r/o any cardiac etiology. - He was told to take ASA 81mg  QD - pt completely asymptomatic now  - Extensive education provided and all questions answered. - Lengthy conversation held with patient today regarding recommendations. -  referral placed to neurology today. - If patient has another episode like this, he knows to go to the emergency room IMMEDIATELY.   - Pt refused Cardiology referral today and states he does not have the money for that now, but states he will go to Neuro since sx were numbness and dizziness. - However, I told him I believe he really needs to go despite his objections and explained why - referral placed to cardiology  - obtain FBW near future, will add B12, mag, extended thyroid panel - Will continue to monitor.    Hypertension - 127/79 last check in office, but elevated today at  158/98. - Reviewed goal blood pressure with patient today. - Beginning calcium channel blocker today in addition to other BP meds.  See med list. - Take a half tablet for eight days, and if blood pressure is not consistently under 140/90, increase to full tablet of new medication.  If BP is normal, do not increase to full dose of med.   - Patient knows to monitor his blood pressure closely. - Advised patient to check his BP twice daily.  - Do NOT take Viagra with high blood pressure and these symptoms. Do not take until eval by Cards and Neuro  - Will continue to monitor closely.   GERD - Managed on omeprazole currently. - Pepcid (famotidine) prescribed today.  See med list.  - Educated importance of preventing reflux from several angles. - Reviewed avoiding trigger foods and natural means of prevention. - Discussed avoiding caffeine, alcohol, acidic foods, spicy foods. - Avoid eating and laying down within three hours.  - Discussed that if his GERD is not well-controlled on two concurrent medications, patient will need referral to gastroenterology for further evaluation and assessment.  - Will continue to monitor.   Elevated LDL, Low HDL (under 40) - On Statin Therapy - Advised patient to take his statin at bedtime, not AM. - No changes made to management at this time as LDL at goal but we will reck labs near future - Need for full fasting lab work in near future. - Will continue to monitor.   Vitamin D Deficiency - Continue management  as established. - Will continue to monitor and re-check in future as recommended.   Recommendations - Patient due for full screening lab work.  Last full set obtained in October 2019. - Return in 4 weeks for follow-up, with blood work 3 days prior to [place]. - Will discuss new BP medication and added reflux medication. - If in 4-6 weeks, patient continues to feel stressed and  anxious, discussed possibility of beginning anti-anxiety medicines such as zoloft, prozac etc.   - As part of my medical decision making, I reviewed the following data within the [facility] History obtained from pt /family, CMA notes reviewed and incorporated if applicable, Labs reviewed, Radiograph/ tests reviewed if applicable and OV notes from prior OV's with me, as well as other specialists she/he has seen since seeing me last, were all reviewed and used in my medical decision making process today.   - Additionally, discussion had with patient regarding txmnt plan, their biases about that plan etc were used in my medical decision making today.   - The patient agreed with the plan and demonstrated an understanding of the instructions.   No barriers to understanding were identified.   - Red flag symptoms and signs discussed in detail.  Patient expressed understanding regarding what to do in case of emergency\ urgent symptoms.  The patient was advised to call back or seek an in-person evaluation if the symptoms worsen or if the condition fails to improve as anticipated.   Return for 4-6 weeks f/up for HTN and GERD, blood work 3 days prior..    Orders Placed This Encounter  Procedures  . B12 and Folate Panel  . CBC with Differential/Platelet  . Comprehensive metabolic panel  . Hemoglobin A1c  . Lipid panel  . Magnesium  . T3  . T4, free  . TSH  . VITAMIN D 25 Hydroxy (Vit-D Deficiency, Fractures)  . Ambulatory referral to Neurology  . Ambulatory referral to Cardiology    Meds ordered this encounter  Medications  . omeprazole (PRILOSEC) 20 MG capsule    Sig: Take 1 capsule (20 mg total) by mouth daily.    Dispense:  90 capsule    Refill:  0  . losartan-hydrochlorothiazide (HYZAAR) 100-25 MG tablet    Sig: Take 1 tablet by mouth daily.    Dispense:  90 tablet    Refill:  0  . atorvastatin (LIPITOR) 80 MG tablet    Sig: Take 0.5 tablets (40 mg total) by mouth at  bedtime.    Dispense:  90 tablet    Refill:  0  . amLODipine (NORVASC) 10 MG tablet    Sig: Take 0.5 tablets (5 mg total) by mouth daily for 8 days, THEN 1 tablet (10 mg total) daily.    Dispense:  90 tablet    Refill:  0  . famotidine (PEPCID) 20 MG tablet    Sig: Take 1 tablet (20 mg total) by mouth 2 (two) times daily.    Dispense:  60 tablet    Refill:  5    Medications Discontinued During This Encounter  Medication Reason  . atorvastatin (LIPITOR) 40 MG tablet Reorder  . omeprazole (PRILOSEC) 20 MG capsule Reorder  . losartan-hydrochlorothiazide (HYZAAR) 100-25 MG tablet Reorder      Note:  This note was prepared with assistance of Dragon voice recognition software. Occasional wrong-word or sound-a-like substitutions may have occurred due to the inherent limitations of voice recognition software.   This document serves as a record  of services personally performed by Mellody Dance, DO. It was created on her behalf by Toni Amend, a trained medical scribe. The creation of this record is based on the scribe's personal observations and the provider's statements to them.   I have reviewed the above medical documentation for accuracy and completeness and I concur.  Mellody Dance, DO 03/15/2019 6:05 PM        Patient Care Team    Relationship Specialty Notifications Start End  Mellody Dance, DO PCP - General Family Medicine  02/23/16   Arta Silence, MD Consulting Physician Gastroenterology  02/23/16   Danella Sensing, MD Consulting Physician Dermatology  02/23/16   Festus Aloe, MD Consulting Physician Urology  02/23/16   Sharlet Salina, MD Referring Physician Physical Medicine and Rehabilitation  02/23/16    Comment: lower back/ transformainal injections  Alexis Frock, MD Consulting Physician Urology  03/13/18     -Vitals obtained; medications/ allergies reconciled;  personal medical, social, Sx etc.histories were updated by CMA, reviewed by me and  are reflected in chart  Patient Active Problem List   Diagnosis Date Noted  . Glucose intolerance (impaired glucose tolerance) 03/13/2018    Priority: High  . Elevated LDL cholesterol level 05/31/2017    Priority: High  . Low HDL (under 40) 05/31/2017    Priority: High  . Obese abdomen 05/30/2016    Priority: High  . HTN (hypertension) 02/23/2016    Priority: High  . GERD (gastroesophageal reflux disease) 05/31/2017    Priority: Medium  . Fatigue 05/31/2017    Priority: Medium  . Hepatitis C infection- s/p harvoni 02/23/2016    Priority: Medium  . History of colonic polyps 02/23/2016    Priority: Medium  . Benign prostatic hyperplasia with lower urinary tract symptoms 07/09/2015    Priority: Medium  . Vitamin D deficiency 05/31/2017    Priority: Low  . Poor diet 05/30/2016    Priority: Low  . Inactivity- not working out and no cardio 05/30/2016    Priority: Low  . History of tobacco use 02/23/2016    Priority: Low  . Hematuria- chronic (followed by Dr Junious Silk urology) 07/11/2015    Priority: Low  . Episode of dizziness 03/14/2019  . Numbness 03/14/2019  . Anxiety 03/14/2019  . On statin therapy 03/14/2019  . Localized swelling, mass or lump of neck 09/20/2017  . Noncompliance with diet and medication regimen 05/31/2017  . DDD (degenerative disc disease), lumbar 12/01/2013  . Lumbar radiculitis 12/01/2013     Current Meds  Medication Sig  . atorvastatin (LIPITOR) 80 MG tablet Take 0.5 tablets (40 mg total) by mouth at bedtime.  . Cholecalciferol (VITAMIN D3) 5000 units TABS 5,000 IU OTC vitamin D3 daily.  . finasteride (PROSCAR) 5 MG tablet Take 5 mg by mouth daily.  Marland Kitchen loratadine (CLARITIN) 10 MG tablet Take 10 mg by mouth daily.  Marland Kitchen losartan-hydrochlorothiazide (HYZAAR) 100-25 MG tablet Take 1 tablet by mouth daily.  Marland Kitchen omeprazole (PRILOSEC) 20 MG capsule Take 1 capsule (20 mg total) by mouth daily.  . sildenafil (VIAGRA) 100 MG tablet 1/2 TABLET EVERY DAY AS  NEEDED  . Vitamin D, Ergocalciferol, (DRISDOL) 1.25 MG (50000 UT) CAPS capsule TAKE 1 CAPSULE (50,000 UNITS TOTAL) BY MOUTH EVERY 7 (SEVEN) DAYS.  . [DISCONTINUED] atorvastatin (LIPITOR) 40 MG tablet Take 1 tablet (40 mg total) by mouth daily. OFFICE VISIT REQUIRED PRIOR TO ANY FURTHER REFILLS  . [DISCONTINUED] losartan-hydrochlorothiazide (HYZAAR) 100-25 MG tablet Take 1 tablet by mouth daily. OFFICE VISIT REQUIRED PRIOR TO  ANY FURTHER REFILLS  . [DISCONTINUED] omeprazole (PRILOSEC) 20 MG capsule Take 1 capsule (20 mg total) by mouth daily. OFFICE VISIT REQUIRED PRIOR TO ANY FURTHER REFILLS     No Known Allergies   ROS:  See above HPI for pertinent positives and negatives   Objective:   There were no vitals taken for this visit.  (if some vitals are omitted, this means that patient was UNABLE to obtain them even though they were asked to get them prior to OV today.  They were asked to call us at their earliest convenience with these once obtained.)  General: A & O * 3; visually in no acute distress; in usual state of health.  Skin: Visible skin appears normal and pt's usual skin color HEENT:  EOMI, head is normocephalic and atraumatic.  Sclera are anicteric. Neck has a good range of motion.  Lips are noncyanotic Chest: normal chest excursion and movement Respiratory: speaking in full sentences, no conversational dyspnea; no use of accessory muscles Psych: insight good, mood- appears full

## 2019-03-15 ENCOUNTER — Encounter: Payer: Self-pay | Admitting: Family Medicine

## 2019-03-15 ENCOUNTER — Other Ambulatory Visit: Payer: Self-pay | Admitting: Family Medicine

## 2019-03-15 DIAGNOSIS — E7800 Pure hypercholesterolemia, unspecified: Secondary | ICD-10-CM

## 2019-03-15 DIAGNOSIS — E786 Lipoprotein deficiency: Secondary | ICD-10-CM

## 2019-03-26 ENCOUNTER — Ambulatory Visit: Payer: BC Managed Care – PPO | Admitting: Cardiology

## 2019-04-03 ENCOUNTER — Other Ambulatory Visit: Payer: Self-pay

## 2019-04-03 ENCOUNTER — Encounter: Payer: Self-pay | Admitting: Cardiology

## 2019-04-03 ENCOUNTER — Ambulatory Visit: Payer: BC Managed Care – PPO | Admitting: Cardiology

## 2019-04-03 VITALS — BP 115/81 | HR 94 | Temp 98.7°F | Ht 68.0 in | Wt 193.2 lb

## 2019-04-03 DIAGNOSIS — I10 Essential (primary) hypertension: Secondary | ICD-10-CM | POA: Diagnosis not present

## 2019-04-03 DIAGNOSIS — R739 Hyperglycemia, unspecified: Secondary | ICD-10-CM

## 2019-04-03 DIAGNOSIS — R55 Syncope and collapse: Secondary | ICD-10-CM | POA: Diagnosis not present

## 2019-04-03 DIAGNOSIS — E782 Mixed hyperlipidemia: Secondary | ICD-10-CM | POA: Diagnosis not present

## 2019-04-03 NOTE — Progress Notes (Signed)
Primary Physician/Referring:  Mellody Dance, DO  Patient ID: Douglas Barnes, male    DOB: 1956/01/22, 63 y.o.   MRN: [id number]  Chief Complaint  Patient presents with  . Hypertension  . Dizziness  . Numbness  . New Patient (Initial Visit)    poor diet   HPI:    Douglas Barnes  is a 63 y.o. 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  On 03/09/2019, when he was sitting in a recliner  and talking to his grandson, he turned his neck and told his wife he has to go pick him up.  At that time suddenly felt warmth and tingling and numbness all the way from his abdomen to his neck, felt he was going to pass out when he stood up and felt his heart racing.  Symptoms subsided after he took it easy for several minutes.  He has not had any recurrence since then but has been worried and concerned about his symptoms as his brother.  He is ordered to him had similar symptoms and was diagnosed with having coronary artery disease and ended up having a stent.  Patient states that similar things happen to him as his brother and was worried about this.  He continues to remain fairly active but since Covid 19 has reduced his physical activity.  Denies chest pain, shortness of breath.  He is compliant with taking all his medications.  He has noticed his blood pressure to be elevated hence recently amlodipine was added to his medications.  Still notices his blood pressure to be high early in the morning but normalizes throughout the day.  Past Medical History:  Diagnosis Date  . BPH (benign prostatic hypertrophy) with urinary retention    . Complication of anesthesia    "fear of not waking up"  . Foley catheter in place   . Hepatitis C antibody test positive    dx 1997--  pt is getting treatment in near future (last enzymes 12 /2016 stable per pt and in epic)  . History of colon polyps   . History of hypertension    no medication since 2014 lost weight and decrease stress  . Hypertension    Past Surgical History:  Procedure Laterality Date  . ANAL FISSURE REPAIR    . COLONOSCOPY WITH PROPOFOL  last one 2015  . GREEN LIGHT LASER TURP (TRANSURETHRAL RESECTION OF PROSTATE N/A 07/09/2015   Procedure: GREEN LIGHT LASER TURP (TRANSURETHRAL RESECTION OF PROSTATE;  Surgeon: Festus Aloe, MD;  Location: [facility];  Service: Urology;  Laterality: N/A;  . INGUINAL HERNIA REPAIR Right age 49  . TRANSURETHRAL RESECTION OF PROSTATE N/A 07/09/2015   Procedure: CYSTOSCOPY WITH CLOT EVACUATION //FULGERATION OF BLEEDERS;  Surgeon: Alexis Frock, MD;  Location: [facility];  Service: Urology;  Laterality: N/A;   Social History   Socioeconomic History  . Marital status: Married    Spouse name: Not on file  . Number of children: 3  . Years of education: Not on file  . Highest education level: Not on file  Occupational History  . Not on file  Social Needs  . Financial  resource strain: Not on file  . Food insecurity    Worry: Not on file    Inability: Not on file  . Transportation needs    Medical: Not on file    Non-medical: Not on file  Tobacco Use  . Smoking status: Former Smoker    Packs/day: 0.00    Years: 25.00    Pack years: 0.00    Types: Cigarettes    Quit date: 04/02/2014    Years since quitting: 5.0  . Smokeless tobacco: Never Used  . Tobacco comment: 5 CIG. PER DAY  Substance and Sexual Activity  . Alcohol use: No  . Drug use: Yes    Types: Marijuana  . Sexual activity: Yes    Birth control/protection: Condom  Lifestyle  . Physical activity    Days per week: Not on file    Minutes per  session: Not on file  . Stress: Not on file  Relationships  . Social Herbalist on phone: Not on file    Gets together: Not on file    Attends religious service: Not on file    Active member of club or organization: Not on file    Attends meetings of clubs or organizations: Not on file    Relationship status: Not on file  . Intimate partner violence    Fear of current or ex partner: Not on file    Emotionally abused: Not on file    Physically abused: Not on file    Forced sexual activity: Not on file  Other Topics Concern  . Not on file  Social History Narrative  . Not on file   ROS  Review of Systems  Constitution: Negative for chills, decreased appetite, malaise/fatigue and weight gain.  Cardiovascular: Negative for dyspnea on exertion, leg swelling and syncope.  Endocrine: Negative for cold intolerance.  Hematologic/Lymphatic: Does not bruise/bleed easily.  Musculoskeletal: Negative for joint swelling.  Gastrointestinal: Negative for abdominal pain, anorexia, change in bowel habit, hematochezia and melena.  Neurological: Negative for headaches and light-headedness.  Psychiatric/Behavioral: Negative for depression and substance abuse.  All other systems reviewed and are negative.  Objective   Vitals with BMI 04/03/2019 04/03/2019 04/03/2019  Height - - 5\' 8"   Weight - - 193 lbs 3 oz  BMI - - [id number]  Systolic [id number] standing [id number] supine [id number] sitting  Diastolic 81 81 82  Pulse 94 74 97     Physical Exam  HENT:  Head: Atraumatic.  Eyes: Conjunctivae are normal.  Neck: Neck supple. No JVD present. No thyromegaly present.  Cardiovascular: Normal rate, regular rhythm, normal heart sounds and intact distal pulses. Exam reveals no gallop.  No murmur heard. No leg edema, no JVD.  Pulmonary/Chest: Effort normal and breath sounds normal.  Abdominal: Soft. Bowel sounds are normal.  Musculoskeletal: Normal range of motion.  Neurological: He is alert.  Skin: Skin is warm  and dry.  Psychiatric: He has a normal mood and affect.   Laboratory examination:    Recent Labs    05/08/18 0902  NA 143  K 4.1  CL 101  CO2 25  GLUCOSE 116*  BUN 16  CREATININE 0.94  CALCIUM 9.2  GFRNONAA 88  GFRAA 101   CMP Latest Ref Rng & Units 05/08/2018 03/15/2018 05/31/2017  Glucose 65 - 99 mg/dL 116(H) 126(H) 109(H)  BUN 8 - 27 mg/dL 16 13 14   Creatinine 0.76 - 1.27 mg/dL 0.94 1.05 0.99  Sodium 134 - 144 mmol/L 143 138  141  Potassium 3.5 - 5.2 mmol/L 4.1 3.7 4.5  Chloride 96 - 106 mmol/L 101 96 100  CO2 20 - 29 mmol/L 25 26 25   Calcium 8.6 - 10.2 mg/dL 9.2 9.4 9.8  Total Protein 6.0 - 8.5 g/dL 6.9 7.5 7.8  Total Bilirubin 0.0 - 1.2 mg/dL 0.9 1.1 1.0  Alkaline Phos 39 - 117 IU/L 56 72 63  AST 0 - 40 IU/L 13 18 15   ALT 0 - 44 IU/L 11 16 14    CBC Latest Ref Rng & Units 05/31/2017 05/30/2016 01/26/2016  WBC 3.4 - 10.8 x10E3/uL 8.0 7.0 8.1  Hemoglobin 13.0 - 17.7 g/dL 16.5 16.3 16.1  Hematocrit 37.5 - 51.0 % 46.1 48.4 44.1  Platelets 150 - 379 x10E3/uL 259 200 187   Lipid Panel     Component Value Date/Time   CHOL 98 (L) 05/08/2018 0902   TRIG 94 05/08/2018 0902   HDL 31 (L) 05/08/2018 0902   CHOLHDL 3.2 05/08/2018 0902   LDLCALC 48 05/08/2018 0902   HEMOGLOBIN A1C Lab Results  Component Value Date   HGBA1C 5.7 (H) 03/15/2018   TSH No results for input(s): TSH in the last 8760 hours. Medications and allergies  No Known Allergies   Prior to Admission medications   Medication Sig Start Date End Date Taking? Authorizing Provider  amLODipine (NORVASC) 10 MG tablet Take 0.5 tablets (5 mg total) by mouth daily for 8 days, THEN 1 tablet (10 mg total) daily. 03/14/19 06/12/19 Yes Opalski, Neoma Laming, DO  atorvastatin (LIPITOR) 80 MG tablet Take 0.5 tablets (40 mg total) by mouth at bedtime. 03/14/19  Yes Opalski, Neoma Laming, DO  Cholecalciferol (VITAMIN D3) 5000 units TABS 5,000 IU OTC vitamin D3 daily. 05/31/17  Yes Opalski, Neoma Laming, DO  finasteride (PROSCAR) 5 MG tablet  Take 5 mg by mouth daily. 02/17/18  Yes [provider]  loratadine (CLARITIN) 10 MG tablet Take 10 mg by mouth daily.   Yes [provider]  losartan-hydrochlorothiazide (HYZAAR) 100-25 MG tablet Take 1 tablet by mouth daily. 03/14/19  Yes Opalski, Neoma Laming, DO  omeprazole (PRILOSEC) 20 MG capsule Take 1 capsule (20 mg total) by mouth daily. 03/14/19  Yes Opalski, Deborah, DO  sildenafil (VIAGRA) 100 MG tablet 1/2 TABLET EVERY DAY AS NEEDED 02/19/19  Yes [provider]  Vitamin D, Ergocalciferol, (DRISDOL) 1.25 MG (50000 UT) CAPS capsule TAKE 1 CAPSULE (50,000 UNITS TOTAL) BY MOUTH EVERY 7 (SEVEN) DAYS. 03/17/19  Yes Mellody Dance, DO     Current Outpatient Medications  Medication Instructions  . amLODipine (NORVASC) 10 MG tablet Take 0.5 tablets (5 mg total) by mouth daily for 8 days, THEN 1 tablet (10 mg total) daily.  Marland Kitchen atorvastatin (LIPITOR) 40 mg, Oral, Daily at bedtime  . Cholecalciferol (VITAMIN D3) 5000 units TABS 5,000 IU OTC vitamin D3 daily.  . finasteride (PROSCAR) 5 mg, Oral, Daily  . loratadine (CLARITIN) 10 mg, Oral, Daily  . losartan-hydrochlorothiazide (HYZAAR) 100-25 MG tablet 1 tablet, Oral, Daily  . omeprazole (PRILOSEC) 20 mg, Oral, Daily  . sildenafil (VIAGRA) 100 MG tablet 1/2 TABLET EVERY DAY AS NEEDED  . Vitamin D (Ergocalciferol) (DRISDOL) 50,000 Units, Oral, Every 7 days    Radiology:  No results found. Cardiac Studies:   None  Assessment     ICD-10-CM   1. Vasovagal episode  R55 PCV CARDIAC STRESS TEST  2. Essential hypertension  I10 EKG 12-Lead    PCV CARDIAC STRESS TEST  3. Mixed hyperlipidemia  E78.2   4. Hyperglycemia  R73.9     EKG 04/03/2019: Normal sinus rhythm at rate of 84 bpm, normal axis, incomplete right bundle branch block.  No evidence of ischemia, otherwise normal EKG.   Recommendations:   Patient with hypertension, hyperlipidemia, hyperglycemia referred to me for evaluation of an episode of what appears  to be vasovagal near syncope.  Except for being overweight, his risk factors are well controlled with excellent control of blood pressure, he had noticed elevated blood pressure but since addition of amlodipine, blood pressure is much improved.  Advised him to switch taking losartan HCT in the morning and amlodipine in the evening.  He has been waking up with diuresis at night.  Physical examination is also unremarkable with a normal EKG, advised him that we will perform a routine treadmill exercise stress test and unless it is abnormal, continue primary prevention is indicated.  Weight loss discussed with the patient as well.  I have not reviewed his labs.  Orthostatic vitals were negative. I will personally perform the test and if I find abnormalities,  will perform further evaluation. Otherwise unless new on ongoing symptoms(patient advised to contact us), preventive  therapy is recommended. I will then see the patient on a PRN basis.   Adrian Prows, MD, [facility] 04/04/2019, 8:06 AM [facility] [place] Pager: [phone] Office: [phone] If no answer Cell [phone]

## 2019-04-08 ENCOUNTER — Other Ambulatory Visit: Payer: BC Managed Care – PPO

## 2019-04-08 ENCOUNTER — Other Ambulatory Visit: Payer: Self-pay

## 2019-04-08 DIAGNOSIS — Z79899 Other long term (current) drug therapy: Secondary | ICD-10-CM

## 2019-04-08 DIAGNOSIS — E7800 Pure hypercholesterolemia, unspecified: Secondary | ICD-10-CM

## 2019-04-08 DIAGNOSIS — R7302 Impaired glucose tolerance (oral): Secondary | ICD-10-CM

## 2019-04-08 DIAGNOSIS — F419 Anxiety disorder, unspecified: Secondary | ICD-10-CM

## 2019-04-08 DIAGNOSIS — E786 Lipoprotein deficiency: Secondary | ICD-10-CM | POA: Diagnosis not present

## 2019-04-08 DIAGNOSIS — R42 Dizziness and giddiness: Secondary | ICD-10-CM | POA: Diagnosis not present

## 2019-04-08 DIAGNOSIS — E559 Vitamin D deficiency, unspecified: Secondary | ICD-10-CM

## 2019-04-08 DIAGNOSIS — I10 Essential (primary) hypertension: Secondary | ICD-10-CM

## 2019-04-08 DIAGNOSIS — R200 Anesthesia of skin: Secondary | ICD-10-CM

## 2019-04-08 DIAGNOSIS — R5383 Other fatigue: Secondary | ICD-10-CM

## 2019-04-08 DIAGNOSIS — E639 Nutritional deficiency, unspecified: Secondary | ICD-10-CM

## 2019-04-09 LAB — COMPREHENSIVE METABOLIC PANEL
ALT: 13 IU/L (ref 0–44)
AST: 12 IU/L (ref 0–40)
Albumin/Globulin Ratio: 1.6 (ref 1.2–2.2)
Albumin: 4.6 g/dL (ref 3.8–4.8)
Alkaline Phosphatase: 67 IU/L (ref 39–117)
BUN/Creatinine Ratio: 12 (ref 10–24)
BUN: 11 mg/dL (ref 8–27)
Bilirubin Total: 1 mg/dL (ref 0.0–1.2)
CO2: 24 mmol/L (ref 20–29)
Calcium: 9.3 mg/dL (ref 8.6–10.2)
Chloride: 103 mmol/L (ref 96–106)
Creatinine, Ser: 0.91 mg/dL (ref 0.76–1.27)
GFR calc Af Amer: 105 mL/min/{1.73_m2} (ref >59)
GFR calc non Af Amer: 91 mL/min/{1.73_m2} (ref >59)
Globulin, Total: 2.8 g/dL (ref 1.5–4.5)
Glucose: 117 mg/dL — ABNORMAL HIGH (ref 65–99)
Potassium: 3.8 mmol/L (ref 3.5–5.2)
Sodium: 143 mmol/L (ref 134–144)
Total Protein: 7.4 g/dL (ref 6.0–8.5)

## 2019-04-09 LAB — LIPID PANEL
Chol/HDL Ratio: 3.1 ratio (ref 0.0–5.0)
Cholesterol, Total: 95 mg/dL — ABNORMAL LOW (ref 100–199)
HDL: 31 mg/dL — ABNORMAL LOW (ref >39)
LDL Chol Calc (NIH): 41 mg/dL (ref 0–99)
Triglycerides: 128 mg/dL (ref 0–149)
VLDL Cholesterol Cal: 23 mg/dL (ref 5–40)

## 2019-04-09 LAB — CBC WITH DIFFERENTIAL/PLATELET
Basophils Absolute: 0.1 10*3/uL (ref 0.0–0.2)
Basos: 1 %
EOS (ABSOLUTE): 0.2 10*3/uL (ref 0.0–0.4)
Eos: 2 %
Hematocrit: 44.5 % (ref 37.5–51.0)
Hemoglobin: 15.3 g/dL (ref 13.0–17.7)
Immature Grans (Abs): 0 10*3/uL (ref 0.0–0.1)
Immature Granulocytes: 0 %
Lymphocytes Absolute: 2.1 10*3/uL (ref 0.7–3.1)
Lymphs: 23 %
MCH: 30.7 pg (ref 26.6–33.0)
MCHC: 34.4 g/dL (ref 31.5–35.7)
MCV: 89 fL (ref 79–97)
Monocytes Absolute: 0.6 10*3/uL (ref 0.1–0.9)
Monocytes: 7 %
Neutrophils Absolute: 6.3 10*3/uL (ref 1.4–7.0)
Neutrophils: 67 %
Platelets: 257 10*3/uL (ref 150–450)
RBC: 4.98 x10E6/uL (ref 4.14–5.80)
RDW: 12.3 % (ref 11.6–15.4)
WBC: 9.2 10*3/uL (ref 3.4–10.8)

## 2019-04-09 LAB — B12 AND FOLATE PANEL
Folate: 16.8 ng/mL (ref >3.0)
Vitamin B-12: 432 pg/mL (ref 232–1245)

## 2019-04-09 LAB — MAGNESIUM: Magnesium: 1.8 mg/dL (ref 1.6–2.3)

## 2019-04-09 LAB — HEMOGLOBIN A1C
Est. average glucose Bld gHb Est-mCnc: 126 mg/dL
Hgb A1c MFr Bld: 6 % — ABNORMAL HIGH (ref 4.8–5.6)

## 2019-04-09 LAB — TSH: TSH: 2.5 u[IU]/mL (ref 0.450–4.500)

## 2019-04-09 LAB — T4, FREE: Free T4: 1.17 ng/dL (ref 0.82–1.77)

## 2019-04-09 LAB — T3: T3, Total: 149 ng/dL (ref 71–180)

## 2019-04-09 LAB — VITAMIN D 25 HYDROXY (VIT D DEFICIENCY, FRACTURES): Vit D, 25-Hydroxy: 70.2 ng/mL (ref 30.0–100.0)

## 2019-04-11 ENCOUNTER — Ambulatory Visit: Payer: BC Managed Care – PPO | Admitting: Family Medicine

## 2019-04-17 ENCOUNTER — Other Ambulatory Visit: Payer: Self-pay | Admitting: Family Medicine

## 2019-04-17 DIAGNOSIS — E786 Lipoprotein deficiency: Secondary | ICD-10-CM

## 2019-04-17 DIAGNOSIS — E7800 Pure hypercholesterolemia, unspecified: Secondary | ICD-10-CM

## 2019-05-01 NOTE — Telephone Encounter (Signed)
See note. Please call and schedule patient

## 2019-05-21 ENCOUNTER — Other Ambulatory Visit: Payer: Self-pay | Admitting: Family Medicine

## 2019-05-21 DIAGNOSIS — E7800 Pure hypercholesterolemia, unspecified: Secondary | ICD-10-CM

## 2019-05-21 DIAGNOSIS — E786 Lipoprotein deficiency: Secondary | ICD-10-CM

## 2019-05-28 ENCOUNTER — Telehealth: Payer: Self-pay | Admitting: Family Medicine

## 2019-05-28 NOTE — Telephone Encounter (Signed)
Patient's wife called regarding Cancelled frm Nov 13 concerned that since labs were drawn fr that appt ( Appt cancelled due to Dr.O not being in office) & ask if were labs results still usable & ask if the lab chrgs could be written off since no OV conducted.  ---Was pt ever contacted to discuss November Lab results ???  --Advised her that labs were processed & that the OV was just for a F/U not a (CPE) (See below) 11/12--LEFT PT MESSAGE THAT APPT HAS BEEN CANCELLED- CB TO RESCHEDULE----glh Office - Return for 4-6 weeks f/up for HTN and GERD, blood work 3 days prior -- AM  --Wife also mention that pt being treated fr Prostate Ca & ask if our labs can be sent to [facility], (yes)   Patient to be contacted to reschedule Missed November appt.  ---Forwarding message to med asst/ Dr. Colman Cater   to review of Dr.Ganji's OV notes & contact pt w/ any updated instructions.  --glh

## 2019-05-28 NOTE — Telephone Encounter (Signed)
Patient needs to reschedule the apt that was canceled to review labs. AS< CMA

## 2019-06-06 ENCOUNTER — Other Ambulatory Visit: Payer: Self-pay | Admitting: Family Medicine

## 2019-06-06 DIAGNOSIS — I10 Essential (primary) hypertension: Secondary | ICD-10-CM

## 2019-06-06 DIAGNOSIS — K219 Gastro-esophageal reflux disease without esophagitis: Secondary | ICD-10-CM

## 2019-06-27 ENCOUNTER — Telehealth: Payer: Self-pay | Admitting: Family Medicine

## 2019-06-27 NOTE — Telephone Encounter (Signed)
Pt's wife states they will reschedule appts w/ PCP after he completes is  initial cancer appts (lots of decision & appts to figure out).  --FYI to med asst.  --glh

## 2019-06-29 ENCOUNTER — Other Ambulatory Visit: Payer: Self-pay | Admitting: Family Medicine

## 2019-06-29 DIAGNOSIS — I10 Essential (primary) hypertension: Secondary | ICD-10-CM

## 2019-06-29 DIAGNOSIS — K219 Gastro-esophageal reflux disease without esophagitis: Secondary | ICD-10-CM

## 2019-07-01 ENCOUNTER — Telehealth: Payer: Self-pay | Admitting: Family Medicine

## 2019-07-01 ENCOUNTER — Other Ambulatory Visit: Payer: Self-pay | Admitting: Family Medicine

## 2019-07-01 DIAGNOSIS — E786 Lipoprotein deficiency: Secondary | ICD-10-CM

## 2019-07-01 DIAGNOSIS — E7800 Pure hypercholesterolemia, unspecified: Secondary | ICD-10-CM

## 2019-07-01 DIAGNOSIS — K219 Gastro-esophageal reflux disease without esophagitis: Secondary | ICD-10-CM

## 2019-07-01 DIAGNOSIS — I10 Essential (primary) hypertension: Secondary | ICD-10-CM

## 2019-07-01 MED ORDER — ATORVASTATIN CALCIUM 80 MG PO TABS: ORAL_TABLET | ORAL | 0 refills | Status: DC

## 2019-07-01 MED ORDER — VITAMIN D (ERGOCALCIFEROL) 1.25 MG (50000 UNIT) PO CAPS: ORAL_CAPSULE | ORAL | 0 refills | Status: DC

## 2019-07-01 NOTE — Telephone Encounter (Signed)
Tyler pls make this a 30 min appt-- Epic will NOT Allow me to.  --glh

## 2019-07-03 ENCOUNTER — Ambulatory Visit (INDEPENDENT_AMBULATORY_CARE_PROVIDER_SITE_OTHER): Payer: Self-pay | Admitting: Family Medicine

## 2019-07-03 ENCOUNTER — Other Ambulatory Visit: Payer: Self-pay

## 2019-07-03 ENCOUNTER — Encounter: Payer: Self-pay | Admitting: Family Medicine

## 2019-07-03 VITALS — BP 117/74 | HR 75 | Ht 68.0 in | Wt 185.0 lb

## 2019-07-03 DIAGNOSIS — E639 Nutritional deficiency, unspecified: Secondary | ICD-10-CM

## 2019-07-03 DIAGNOSIS — R7302 Impaired glucose tolerance (oral): Secondary | ICD-10-CM

## 2019-07-03 DIAGNOSIS — I10 Essential (primary) hypertension: Secondary | ICD-10-CM

## 2019-07-03 DIAGNOSIS — K219 Gastro-esophageal reflux disease without esophagitis: Secondary | ICD-10-CM

## 2019-07-03 DIAGNOSIS — E786 Lipoprotein deficiency: Secondary | ICD-10-CM

## 2019-07-03 DIAGNOSIS — E7800 Pure hypercholesterolemia, unspecified: Secondary | ICD-10-CM

## 2019-07-03 DIAGNOSIS — E559 Vitamin D deficiency, unspecified: Secondary | ICD-10-CM

## 2019-07-03 MED ORDER — OMEPRAZOLE 20 MG PO CPDR: 20.0000 mg | DELAYED_RELEASE_CAPSULE | Freq: Every day | ORAL | 0 refills | Status: DC

## 2019-07-03 MED ORDER — LOSARTAN POTASSIUM-HCTZ 100-25 MG PO TABS: 1.0000 | ORAL_TABLET | Freq: Every day | ORAL | 0 refills | Status: DC

## 2019-07-03 MED ORDER — ATORVASTATIN CALCIUM 80 MG PO TABS: ORAL_TABLET | ORAL | 0 refills | Status: DC

## 2019-07-03 MED ORDER — AMLODIPINE BESYLATE 10 MG PO TABS: 10.0000 mg | ORAL_TABLET | Freq: Every day | ORAL | 0 refills | Status: DC

## 2019-07-03 MED ORDER — VITAMIN D (ERGOCALCIFEROL) 1.25 MG (50000 UNIT) PO CAPS: ORAL_CAPSULE | ORAL | 1 refills | Status: DC

## 2019-07-03 NOTE — Progress Notes (Signed)
Virtual office visit note for [employer], D.O- Primary Care Physician at [facility]   I connected with current patient today and beyond visually recognizing the correct individual, I verified that I am speaking with the correct person using two identifiers.  . Location of the patient: Home . Location of the provider: Office Only the patient (+/- their family members at pt's discretion) and myself were participating in the encounter   - This visit type was conducted due to national recommendations for restrictions regarding the COVID-19 Pandemic (e.g. social distancing) in an effort to limit this patient's exposure and mitigate transmission in our community.  This format is felt to be most appropriate for this patient at this time.   - No physical exam could be performed with this format, beyond that communicated to [place] by the patient/ family members as noted.   - Additionally my office staff/ schedulers discussed with the patient that there may be a monetary charge related to this service, depending on patient's medical insurance.   The patient expressed understanding, and agreed to proceed.      History of Present Illness: Hypertension and Gastroesophageal Reflux   Notes "I'm much better."  - Cardiac Health Had a negative stress test with Dr. Einar Gip on 04/03/2019 and obtained clearance from cardiology to begin working out again.  Notes one of his main goals is to "lose this weight we've been talking about for the past several years."  He's currently down 13 lbs and is going to the gym several times per week.  He does 15 minutes each workout of cardio, and then uses weights.  Notes "today I actually did 20 minutes of cardio, and when I do cardio, I'm holding weights and doing the ten incline."  Notes "I'm doing it to benefit me."  He was initially working out 7 days per week, but notes the gym cut him back.  His goal weight is around 175 lbs.  - GERD States that insurance  would not cover his famotidine.  He is not taking this.  "I adjusted my diet to take care of [symptoms]."  He takes omeprazole at night now, and notes "it's doing me better."  He is also drinking a lot more water.  - Prostate Health Notes that his PSA is a consistent three and he is going to have a biopsy in August.  He would prefer not to have the biopsy and notes he has several questions to ask his specialist.  - Chronic Back Pain Notes some of his back pain / mobility has improved since he's gotten stronger.  Says there are still some directions he cannot turn, but he has noticed improvements since continuing his intensive workouts.  HPI:  Hypertension:  -  His blood pressure at home has been running: 117/74 "since I dropped the weight."  Notes I started dropping the weight right there in December and it took about thirty days."    - Patient reports good compliance with medication and/or lifestyle modification.  He is tolerating his meds well.  - His denies acute concerns or problems related to treatment plan  - He denies new onset of: chest pain, exercise intolerance, shortness of breath, dizziness, visual changes, headache, lower extremity swelling or claudication.   Last 3 blood pressure readings in our office are as follows: BP Readings from Last 3 Encounters:  07/03/19 117/74  04/03/19 115/81  09/24/18 127/79   Filed Weights   07/03/19 1609  Weight:  185 lb (83.9 kg)       Depression screen [facility] 2/9 07/03/2019 03/14/2019 09/24/2018 04/18/2018 03/13/2018  Decreased Interest 1 0 0 0 0  Down, Depressed, Hopeless 0 0 0 0 0  PHQ - 2 Score 1 0 0 0 0  Altered sleeping 3 0 0 0 0  Tired, decreased energy 0 0 3 1 0  Change in appetite 0 0 0 0 0  Feeling bad or failure about yourself  0 0 0 0 0  Trouble concentrating 0 0 0 0 0  Moving slowly or fidgety/restless 0 0 0 0 0  Suicidal thoughts 0 0 0 0 0  PHQ-9 Score 4 0 3 1 0  Difficult doing work/chores Not difficult at all  Not difficult at all Not difficult at all Not difficult at all Not difficult at all    No flowsheet data found.   Impression and Recommendations:    1. Essential hypertension   2. Elevated LDL cholesterol level   3. Low HDL (under 40)   4. Glucose intolerance (impaired glucose tolerance)   5. Poor diet   6. Gastroesophageal reflux disease, unspecified whether esophagitis present     Glucose Intolerance, Poor Diet - Last labs obtained two months ago.  - A1c elevated to 6.0 from 5.7 prior. - Encouraged patient to continue with lifestyle changes.  - Counseled patient on prevention of diabetes and discussed dietary and lifestyle modification as first line.    - Importance of low carb, heart-healthy diet discussed with patient in addition to regular aerobic exercise of 24min 5d/week or more.   - Encouraged patient to engage in meal prep to assist with dietary goals.  - We will continue to monitor and re-check as discussed.  GERD - Began Pepcid last OV, in addition to omeprazole. - Per patient, was unable to pick up famotidine due to difficulties with insurance coverage.  - Taking omeprazole at night with better control of symptoms. - Symptoms stable on current treatment plan. - Continue treatment plan as established.  See med list.  - Encouraged patient to continue modifying his lifestyle to control GERD. - Advised patient to avoid spicy, fatty, acidic foods to help reduce symptoms.  - Will continue to monitor.  Essential Hypertension - Began calcium channel blocker last OV. - Blood pressure currently is stable, at goal.  - Patient will continue current treatment regimen.  See med list. - Tolerating meds well without S-E. - Per patient, goal is to lose weight and discontinue medications.  - Counseled patient on pathophysiology of disease and discussed various treatment options, which always includes dietary and lifestyle modification as first line.   - Lifestyle  changes such as dash and heart healthy diets and engaging in a regular exercise program discussed extensively with patient.   - Ambulatory blood pressure monitoring encouraged at least 3 times weekly.  Keep log and bring in every office visit.  Reminded patient that if they ever feel poorly in any way, to check their blood pressure and pulse.  - We will continue to monitor.  Elevated LDL, Low HDL - Last FLP obtained 2 months ago.  - Cholesterol levels improved last check. - Triglycerides = 128, up from 94 prior. - HDL = 31, low, unchanged from 31 prior. - LDL = 41, WNL, stable.  - Pt will continue current treatment regimen.  See med list. - Per patient, goal is to lose weight and discontinue medications.  - Dietary changes such as low saturated & trans  fat diets for hyperlipidemia and low carb diets for hypertriglyceridemia discussed with patient.    - To help improve HDL, encouraged patient to follow AHA guidelines for regular exercise and also engage in weight loss if BMI above 25.   - We will continue to monitor and re-check as discussed.  Lifestyle & Preventative Health Maintenance - Advised patient to continue working toward exercising to improve overall mental, physical, and emotional health.    - Reviewed the "spokes of the wheel" of mood and health management.  Stressed the importance of ongoing prudent habits, including regular exercise, appropriate sleep hygiene, healthful dietary habits, and prayer/meditation to relax.  - Encouraged patient to engage in daily physical activity as tolerated, especially a formal exercise routine.  Recommended that the patient eventually strive for at least 150 minutes of moderate cardiovascular activity per week according to guidelines established by the [facility].   - Healthy dietary habits encouraged, including low-carb, and high amounts of lean protein in diet.   - Patient should also consume adequate amounts of water.  - Health counseling  performed.  All questions answered.  Recommendations - Return in 2 months for progress on lifestyle changes.  Will consider cutting back on meds if patient is doing well with lifestyle changes.   - As part of my medical decision making, I reviewed the following data within the [facility] History obtained from pt /family, CMA notes reviewed and incorporated if applicable, Labs reviewed, Radiograph/ tests reviewed if applicable and OV notes from prior OV's with me, as well as other specialists she/he has seen since seeing me last, were all reviewed and used in my medical decision making process today.   - Additionally, discussion had with patient regarding txmnt plan, their biases about that plan etc were used in my medical decision making today.   - The patient agreed with the plan and demonstrated an understanding of the instructions.   No barriers to understanding were identified.      Return for f/up 2nd or 3rd wk of April, if doing well on lifestyle changes, will consider cutting back on meds.    No orders of the defined types were placed in this encounter.   Meds ordered this encounter  Medications  . omeprazole (PRILOSEC) 20 MG capsule    Sig: Take 1 capsule (20 mg total) by mouth at bedtime.    Dispense:  90 capsule    Refill:  0    Medications Discontinued During This Encounter  Medication Reason  . loratadine (CLARITIN) 10 MG tablet Error  . omeprazole (PRILOSEC) 20 MG capsule Reorder      Note:  This note was prepared with assistance of Dragon voice recognition software. Occasional wrong-word or sound-a-like substitutions may have occurred due to the inherent limitations of voice recognition software.  This document serves as a record of services personally performed by Mellody Dance, DO. It was created on her behalf by Toni Amend, a trained medical scribe. The creation of this record is based on the scribe's personal observations and the  provider's statements to them.   This case required medical decision making of at least moderate complexity. The above documentation from Toni Amend, medical scribe, has been reviewed by Marjory Sneddon, D.O.      Patient Care Team    Relationship Specialty Notifications Start End  Mellody Dance, DO PCP - General Family Medicine  02/23/16   Arta Silence, MD Consulting Physician Gastroenterology  02/23/16   Danella Sensing, MD  Consulting Physician Dermatology  02/23/16   Festus Aloe, MD Consulting Physician Urology  02/23/16   Sharlet Salina, MD Referring Physician Physical Medicine and Rehabilitation  02/23/16    Comment: lower back/ transformainal injections  Alexis Frock, MD Consulting Physician Urology  03/13/18     -Vitals obtained; medications/ allergies reconciled;  personal medical, social, Sx etc.histories were updated by CMA, reviewed by me and are reflected in chart  Patient Active Problem List   Diagnosis Date Noted  . Episode of dizziness 03/14/2019  . Numbness 03/14/2019  . Anxiety 03/14/2019  . On statin therapy 03/14/2019  . Glucose intolerance (impaired glucose tolerance) 03/13/2018  . Localized swelling, mass or lump of neck 09/20/2017  . GERD (gastroesophageal reflux disease) 05/31/2017  . Elevated LDL cholesterol level 05/31/2017  . Low HDL (under 40) 05/31/2017  . Fatigue 05/31/2017  . Vitamin D deficiency 05/31/2017  . Noncompliance with diet and medication regimen 05/31/2017  . Obese abdomen 05/30/2016  . Poor diet 05/30/2016  . Inactivity- not working out and no cardio 05/30/2016  . HTN (hypertension) 02/23/2016  . History of tobacco use 02/23/2016  . Hepatitis C infection- s/p harvoni 02/23/2016  . History of colonic polyps 02/23/2016  . Hematuria- chronic (followed by Dr Junious Silk urology) 07/11/2015  . Benign prostatic hyperplasia with lower urinary tract symptoms 07/09/2015  . DDD (degenerative disc disease), lumbar  12/01/2013  . Lumbar radiculitis 12/01/2013     Current Meds  Medication Sig  . amLODipine (NORVASC) 10 MG tablet TAKE 1 TABLET (10 MG TOTAL) BY MOUTH DAILY. **PATIENT NEEDS APT FOR FURTHER REFILLS**  . atorvastatin (LIPITOR) 80 MG tablet TAKE 1/2 TABLET BY MOUTH AT BEDTIME **PATIENT NEEDS APT FOR FURTHER REFILLS**  . Cholecalciferol (VITAMIN D3) 5000 units TABS 5,000 IU OTC vitamin D3 daily.  . finasteride (PROSCAR) 5 MG tablet Take 5 mg by mouth daily.  Marland Kitchen losartan-hydrochlorothiazide (HYZAAR) 100-25 MG tablet TAKE 1 TABLET BY MOUTH DAILY. **PATIENT NEEDS APT FOR FURTHER REFILLS**  . omeprazole (PRILOSEC) 20 MG capsule Take 1 capsule (20 mg total) by mouth at bedtime.  . sildenafil (VIAGRA) 100 MG tablet 1/2 TABLET EVERY DAY AS NEEDED  . Vitamin D, Ergocalciferol, (DRISDOL) 1.25 MG (50000 UNIT) CAPS capsule **PATIENT NEEDS APT FOR FURTHER MED REFILLS**  . [DISCONTINUED] omeprazole (PRILOSEC) 20 MG capsule TAKE 1 CAPSULE (20 MG TOTAL) BY MOUTH DAILY. **PATIENT NEEDS APT FOR FURTHER REFILLS**     No Known Allergies   ROS:  See above HPI for pertinent positives and negatives   Objective:   Blood pressure 117/74, pulse 75, height 5\' 8"  (1.727 m), weight 185 lb (83.9 kg).  (if some vitals are omitted, this means that patient was UNABLE to obtain them even though they were asked to get them prior to OV today.  They were asked to call us at their earliest convenience with these once obtained.)  General: A & O * 3; visually in no acute distress; in usual state of health.  Skin: Visible skin appears normal and pt's usual skin color HEENT:  EOMI, head is normocephalic and atraumatic.  Sclera are anicteric. Neck has a good range of motion.  Lips are noncyanotic Chest: normal chest excursion and movement Respiratory: speaking in full sentences, no conversational dyspnea; no use of accessory muscles Psych: insight good, mood- appears full

## 2019-09-16 ENCOUNTER — Other Ambulatory Visit: Payer: Self-pay

## 2019-09-16 ENCOUNTER — Encounter: Payer: Self-pay | Admitting: Family Medicine

## 2019-09-16 ENCOUNTER — Telehealth (INDEPENDENT_AMBULATORY_CARE_PROVIDER_SITE_OTHER): Payer: 59 | Admitting: Family Medicine

## 2019-09-16 VITALS — BP 118/76 | Ht 68.0 in | Wt 187.0 lb

## 2019-09-16 DIAGNOSIS — E559 Vitamin D deficiency, unspecified: Secondary | ICD-10-CM

## 2019-09-16 DIAGNOSIS — E786 Lipoprotein deficiency: Secondary | ICD-10-CM | POA: Diagnosis not present

## 2019-09-16 DIAGNOSIS — E7800 Pure hypercholesterolemia, unspecified: Secondary | ICD-10-CM

## 2019-09-16 DIAGNOSIS — I10 Essential (primary) hypertension: Secondary | ICD-10-CM | POA: Diagnosis not present

## 2019-09-16 DIAGNOSIS — E785 Hyperlipidemia, unspecified: Secondary | ICD-10-CM | POA: Diagnosis not present

## 2019-09-16 DIAGNOSIS — R7302 Impaired glucose tolerance (oral): Secondary | ICD-10-CM | POA: Diagnosis not present

## 2019-09-16 DIAGNOSIS — R7303 Prediabetes: Secondary | ICD-10-CM

## 2019-09-16 NOTE — Progress Notes (Signed)
Telehealth office visit note for Douglas Barnes, D.O- at Primary Care at [facility]   I connected with current patient today and verified that I am speaking with the correct person   . Location of the patient: Home . Location of the provider: Office - This visit type was conducted due to national recommendations for restrictions regarding the COVID-19 Pandemic (e.g. social distancing) in an effort to limit this patient's exposure and mitigate transmission in our community.    - No physical exam could be performed with this format, beyond that communicated to [place] by the patient/ family members as noted.   - Additionally my office staff/ schedulers were to discuss with the patient that there may be a monetary charge related to this service, depending on their medical insurance.  My understanding is that patient understood and consented to proceed.     _________________________________________________________________________________   History of Present Illness:  I, Toni Amend, am serving as Education administrator for [employer].  Patient states today that he is doing well.  "I'm good."  He continues going to the gym several days per week.  Overall, he feels his weight has been stable.  Feels his stress levels are under control and "everything's good."  He does desire to go off of medications wherever possible.  HPI:  Hypertension:  -  His blood pressure at home has been running: "All under normal" and levelled out.  Feels his BP had been around 118/76 on average.    In the beginning of March, his BP was running around 104/67, 104/60's.  Denies feeling dizzy or lightheaded at that time.  He was working out, Social research officer, government., and says "I didn't feel abnormal."  - Patient reports good compliance with medication and/or lifestyle modification  - His denies acute concerns or problems related to treatment plan  - He denies new onset of: chest pain, exercise intolerance, shortness of breath,  dizziness, visual changes, headache, lower extremity swelling or claudication.   Last 3 blood pressure readings in our office are as follows: BP Readings from Last 3 Encounters:  09/16/19 118/76  07/03/19 117/74  04/03/19 115/81   Filed Weights   09/16/19 0850  Weight: 187 lb (84.8 kg)    HPI:  Hyperlipidemia:  64 y.o. male here for cholesterol follow-up.   - Patient reports good compliance with treatment plan of:  medication and/ or lifestyle management.    - Patient denies any acute concerns or problems with management plan   - He denies new onset of: myalgias, arthralgias, increased fatigue more than normal, chest pains, exercise intolerance, shortness of breath, dizziness, visual changes, headache, lower extremity swelling or claudication.   Most recent cholesterol panel was:  Lab Results  Component Value Date   CHOL 95 (L) 04/08/2019   HDL 31 (L) 04/08/2019   LDLCALC 41 04/08/2019   TRIG 128 04/08/2019   CHOLHDL 3.1 04/08/2019   Hepatic Function Latest Ref Rng & Units 04/08/2019 05/08/2018 03/15/2018  Total Protein 6.0 - 8.5 g/dL 7.4 6.9 7.5  Albumin 3.8 - 4.8 g/dL 4.6 4.2 4.7  AST 0 - 40 IU/L 12 13 18   ALT 0 - 44 IU/L 13 11 16   Alk Phosphatase 39 - 117 IU/L 67 56 72  Total Bilirubin 0.0 - 1.2 mg/dL 1.0 0.9 1.1    Prediabetes:  Last A1C in the office was:  Lab Results  Component Value Date   HGBA1C 6.0 (H) 04/08/2019   HGBA1C 5.7 (H) 03/15/2018  HGBA1C 5.9 (H) 05/31/2017   Lab Results  Component Value Date   LDLCALC 41 04/08/2019   CREATININE 0.91 04/08/2019   BP Readings from Last 3 Encounters:  09/16/19 118/76  07/03/19 117/74  04/03/19 115/81   Wt Readings from Last 3 Encounters:  09/16/19 187 lb (84.8 kg)  07/03/19 185 lb (83.9 kg)  04/03/19 193 lb 3.2 oz (87.6 kg)       GAD 7 : Generalized Anxiety Score 09/16/2019  Nervous, Anxious, on Edge 0  Control/stop worrying 0  Worry too much - different things 0  Trouble relaxing 0  Restless  0  Easily annoyed or irritable 0  Afraid - awful might happen 0  Total GAD 7 Score 0    Depression screen [facility] 2/9 09/16/2019 07/03/2019 03/14/2019 09/24/2018 04/18/2018  Decreased Interest 0 1 0 0 0  Down, Depressed, Hopeless 0 0 0 0 0  PHQ - 2 Score 0 1 0 0 0  Altered sleeping 0 3 0 0 0  Tired, decreased energy 0 0 0 3 1  Change in appetite 0 0 0 0 0  Feeling bad or failure about yourself  0 0 0 0 0  Trouble concentrating 0 0 0 0 0  Moving slowly or fidgety/restless 0 0 0 0 0  Suicidal thoughts 0 0 0 0 0  PHQ-9 Score 0 4 0 3 1  Difficult doing work/chores - Not difficult at all Not difficult at all Not difficult at all Not difficult at all  Some recent data might be hidden      Impression and Recommendations:     1. Essential hypertension   2. Elevated LDL cholesterol level   3. Low HDL (under 40)   4. Glucose intolerance (impaired glucose tolerance)   5. Vitamin D deficiency      Need for Repeat Colonoscopy - Advised patient of need to obtain repeat colonoscopy. - Last colonoscopy obtained in 2015 with repeat due in 5 years. - Per patient, is on a schedule with his GI doc's ([facility]) office.  Patient agrees to call GI.  - Will continue to monitor alongside specialist.   Essential Hypertension - Blood pressure currently is stable, at goal. - Patient will continue current treatment regimen.  See med list.  - Advised patient to watch for symptomatic low blood pressure, and reviewed signs of orthostatic hypotension with patient today.  - If patient begins to feel lightheaded or dizzy, he will call in and let us know. - As patient desires to reduce his medications, in the future if  needed, will continue amlodipine, cut HCTZ, and begin plain losartan 100 mg daily.  - Counseled patient on pathophysiology of disease and discussed various treatment options, which always includes dietary and lifestyle modification as first line.   - Lifestyle changes such as dash and heart  healthy diets and engaging in a regular exercise program discussed extensively with patient.   - Ambulatory blood pressure monitoring encouraged at least 3 times weekly.  Keep log and bring in every office visit.  Reminded patient that if they ever feel poorly in any way, to check their blood pressure and pulse.  - We will continue to monitor.   Elevated LDL, Low HDL (under 40) - Cholesterol levels stable last check five months ago. - Continue current treatment regimen.  See med list.  - Prudent dietary changes such as low saturated & trans fat diets for hyperlipidemia and low carb diets for hypertriglyceridemia discussed with patient.    -  Encouraged patient to follow AHA guidelines for regular exercise and also engage in weight loss if BMI above 25.   - We will continue to monitor and re-check sometime this week as discussed.   Glucose Intolerance - A1c 6.0 last check five months ago, up from 5.7 one year ago.  - Counseled patient on prevention of diabetes and discussed various treatment options, which always includes dietary and lifestyle modification as first line.    - Importance of low carb, heart-healthy diet discussed with patient in addition to regular aerobic exercise of 36mn 5d/week or more.   - Will continue to monitor and re-check sometime this week as discussed.   Vitamin D Deficiency - 70.2 last check five months ago, up from 51.2 one year ago.  - Continue management as established.  See med list.  - Will continue to monitor.   - The patient agreed with the plan and demonstrated an understanding of the instructions.   No barriers to understanding were identified.     - The patient was advised to call back or seek an in-person evaluation if the symptoms worsen or if the condition fails to improve as anticipated.   Return for blood work (FLP, A1c) ASAP; f/up OV 4 mo as scheduled.    Orders Placed This Encounter  Procedures  . Hemoglobin A1c  . Lipid panel       Medications Discontinued During This Encounter  Medication Reason  . Cholecalciferol (VITAMIN D3) 5000 units TABS      Time spent on visit including pre-visit chart review and post-visit care was 31 minutes.   Note:  This note was prepared with assistance of Dragon voice recognition software. Occasional wrong-word or sound-a-like substitutions may have occurred due to the inherent limitations of voice recognition software.  The [place] law in 2016 which includes the topic of electronic health records.  This provides immediate access to information in MyChart.  This includes consultation notes, operative notes, office notes, lab results and pathology reports.  If you have any questions about what you read please let u[place]now at your next visit or call u[place]at the office.  We are right here with you.  This document serves as a record of services personally performed by DMellody Dance DO. It was created on her behalf by KToni Amend a trained medical scribe. The creation of this record is based on the scribe's personal observations and the provider's statements to them.    The above documentation from KToni Amend medical scribe, has been reviewed by DMarjory Sneddon D.O. __________________________________________________________________________________     Patient Care Team    Relationship Specialty Notifications Start End  OMellody Dance DO PCP - General Family Medicine  02/23/16   OArta Silence MD Consulting Physician Gastroenterology  02/23/16   JDanella Sensing MD Consulting Physician Dermatology  02/23/16   EFestus Aloe MD Consulting Physician Urology  02/23/16   CSharlet Salina MD Referring Physician Physical Medicine and Rehabilitation  02/23/16    Comment: lower back/ transformainal injections  MAlexis Frock MD Consulting Physician Urology  03/13/18      -Vitals obtained; medications/ allergies reconciled;   personal medical, social, Sx etc.histories were updated by CMA, reviewed by me and are reflected in chart   Patient Active Problem List   Diagnosis Date Noted  . Glucose intolerance (impaired glucose tolerance) 03/13/2018  . Elevated LDL cholesterol level 05/31/2017  . Low HDL (under 40) 05/31/2017  . Obese abdomen 05/30/2016  .  HTN (hypertension) 02/23/2016  . GERD (gastroesophageal reflux disease) 05/31/2017  . Fatigue 05/31/2017  . Hepatitis C infection- s/p harvoni 02/23/2016  . History of colonic polyps 02/23/2016  . Benign prostatic hyperplasia with lower urinary tract symptoms 07/09/2015  . Vitamin D deficiency 05/31/2017  . Poor diet 05/30/2016  . Inactivity- not working out and no cardio 05/30/2016  . History of tobacco use 02/23/2016  . Hematuria- chronic (followed by Dr Junious Silk urology) 07/11/2015  . Episode of dizziness 03/14/2019  . Numbness 03/14/2019  . Anxiety 03/14/2019  . On statin therapy 03/14/2019  . Localized swelling, mass or lump of neck 09/20/2017  . Noncompliance with diet and medication regimen 05/31/2017  . DDD (degenerative disc disease), lumbar 12/01/2013  . Lumbar radiculitis 12/01/2013     Current Meds  Medication Sig  . amLODipine (NORVASC) 10 MG tablet Take 1 tablet (10 mg total) by mouth daily.  Marland Kitchen atorvastatin (LIPITOR) 80 MG tablet TAKE 1/2 TABLET BY MOUTH AT BEDTIME  . finasteride (PROSCAR) 5 MG tablet Take 5 mg by mouth daily.  Marland Kitchen losartan-hydrochlorothiazide (HYZAAR) 100-25 MG tablet Take 1 tablet by mouth daily.  Marland Kitchen omeprazole (PRILOSEC) 20 MG capsule Take 1 capsule (20 mg total) by mouth at bedtime.  . sildenafil (VIAGRA) 100 MG tablet 1/2 TABLET EVERY DAY AS NEEDED  . Vitamin D, Ergocalciferol, (DRISDOL) 1.25 MG (50000 UNIT) CAPS capsule 1 po q wk  . [DISCONTINUED] Cholecalciferol (VITAMIN D3) 5000 units TABS 5,000 IU OTC vitamin D3 daily.     Allergies:  No Known Allergies   ROS:  See above HPI for pertinent positives and  negatives   Objective:   Blood pressure 118/76, height 5' 8"  (1.727 m), weight 187 lb (84.8 kg).  (if some vitals are omitted, this means that patient was UNABLE to obtain them even though they were asked to get them prior to OV today.  They were asked to call us at their earliest convenience with these once obtained. ) General: A & O * 3; sounds in no acute distress; in usual state of health.  Skin: Pt confirms warm and dry extremities and pink fingertips HEENT: Pt confirms lips non-cyanotic Chest: Patient confirms normal chest excursion and movement Respiratory: speaking in full sentences, no conversational dyspnea; patient confirms no use of accessory muscles Psych: insight appears good, mood- appears full

## 2019-09-18 ENCOUNTER — Other Ambulatory Visit: Payer: Self-pay | Admitting: Family Medicine

## 2019-09-19 ENCOUNTER — Other Ambulatory Visit: Payer: 59

## 2019-09-19 ENCOUNTER — Other Ambulatory Visit: Payer: Self-pay

## 2019-09-19 DIAGNOSIS — E7800 Pure hypercholesterolemia, unspecified: Secondary | ICD-10-CM

## 2019-09-19 DIAGNOSIS — R7302 Impaired glucose tolerance (oral): Secondary | ICD-10-CM

## 2019-09-19 DIAGNOSIS — E786 Lipoprotein deficiency: Secondary | ICD-10-CM

## 2019-09-20 LAB — LIPID PANEL
Chol/HDL Ratio: 3 ratio (ref 0.0–5.0)
Cholesterol, Total: 95 mg/dL — ABNORMAL LOW (ref 100–199)
HDL: 32 mg/dL — ABNORMAL LOW (ref >39)
LDL Chol Calc (NIH): 44 mg/dL (ref 0–99)
Triglycerides: 101 mg/dL (ref 0–149)
VLDL Cholesterol Cal: 19 mg/dL (ref 5–40)

## 2019-09-20 LAB — HEMOGLOBIN A1C
Est. average glucose Bld gHb Est-mCnc: 131 mg/dL
Hgb A1c MFr Bld: 6.2 % — ABNORMAL HIGH (ref 4.8–5.6)

## 2019-10-06 ENCOUNTER — Other Ambulatory Visit: Payer: Self-pay | Admitting: Family Medicine

## 2019-10-06 DIAGNOSIS — K219 Gastro-esophageal reflux disease without esophagitis: Secondary | ICD-10-CM

## 2019-10-06 DIAGNOSIS — I10 Essential (primary) hypertension: Secondary | ICD-10-CM

## 2019-12-11 ENCOUNTER — Other Ambulatory Visit: Payer: Self-pay | Admitting: Family Medicine

## 2019-12-11 DIAGNOSIS — E559 Vitamin D deficiency, unspecified: Secondary | ICD-10-CM

## 2020-01-01 ENCOUNTER — Other Ambulatory Visit: Payer: Self-pay | Admitting: Physician Assistant

## 2020-01-01 DIAGNOSIS — I10 Essential (primary) hypertension: Secondary | ICD-10-CM

## 2020-01-01 DIAGNOSIS — K219 Gastro-esophageal reflux disease without esophagitis: Secondary | ICD-10-CM

## 2020-02-23 ENCOUNTER — Telehealth: Payer: Self-pay | Admitting: Physician Assistant

## 2020-02-23 ENCOUNTER — Other Ambulatory Visit: Payer: Self-pay | Admitting: Physician Assistant

## 2020-02-23 DIAGNOSIS — K219 Gastro-esophageal reflux disease without esophagitis: Secondary | ICD-10-CM

## 2020-02-23 DIAGNOSIS — E786 Lipoprotein deficiency: Secondary | ICD-10-CM

## 2020-02-23 DIAGNOSIS — E7800 Pure hypercholesterolemia, unspecified: Secondary | ICD-10-CM

## 2020-02-23 DIAGNOSIS — I10 Essential (primary) hypertension: Secondary | ICD-10-CM

## 2020-02-23 MED ORDER — ATORVASTATIN CALCIUM 80 MG PO TABS: 40.0000 mg | ORAL_TABLET | Freq: Every day | ORAL | 0 refills | Status: DC

## 2020-02-23 NOTE — Telephone Encounter (Signed)
Can you please call patient to schedule apt for refills. AS, CMA

## 2020-04-20 ENCOUNTER — Telehealth (INDEPENDENT_AMBULATORY_CARE_PROVIDER_SITE_OTHER): Payer: 59 | Admitting: Medical-Surgical

## 2020-04-20 ENCOUNTER — Telehealth: Payer: Self-pay | Admitting: Physician Assistant

## 2020-04-20 ENCOUNTER — Other Ambulatory Visit: Payer: Self-pay

## 2020-04-20 ENCOUNTER — Encounter: Payer: Self-pay | Admitting: Medical-Surgical

## 2020-04-20 VITALS — BP 122/70 | HR 88 | Wt 160.0 lb

## 2020-04-20 DIAGNOSIS — J329 Chronic sinusitis, unspecified: Secondary | ICD-10-CM

## 2020-04-20 DIAGNOSIS — K219 Gastro-esophageal reflux disease without esophagitis: Secondary | ICD-10-CM

## 2020-04-20 DIAGNOSIS — J40 Bronchitis, not specified as acute or chronic: Secondary | ICD-10-CM

## 2020-04-20 MED ORDER — HYDROCODONE-HOMATROPINE 5-1.5 MG/5ML PO SYRP: 5.0000 mL | ORAL_SOLUTION | Freq: Three times a day (TID) | ORAL | 0 refills | Status: DC | PRN

## 2020-04-20 MED ORDER — AMOXICILLIN-POT CLAVULANATE 875-125 MG PO TABS: 1.0000 | ORAL_TABLET | Freq: Two times a day (BID) | ORAL | 0 refills | Status: DC

## 2020-04-20 MED ORDER — IPRATROPIUM BROMIDE 0.03 % NA SOLN: 2.0000 | Freq: Two times a day (BID) | NASAL | 0 refills | Status: DC

## 2020-04-20 MED ORDER — OMEPRAZOLE 20 MG PO CPDR: 20.0000 mg | DELAYED_RELEASE_CAPSULE | Freq: Every day | ORAL | 0 refills | Status: DC

## 2020-04-20 NOTE — Telephone Encounter (Signed)
Pt states that [facility] does not have the Hycodan in stock.  He is requesting that a new RX be sent to [facility].  Please review request.  Charyl Bigger, CMA

## 2020-04-20 NOTE — Telephone Encounter (Signed)
Called [facility] and cancelled RX for hycodan with them.  Pt aware of new RX at [facility].  Charyl Bigger, CMA

## 2020-04-20 NOTE — Telephone Encounter (Signed)
Patient saw Caryl Asp this morning and she sent in Hycodan. Patient would like it sent to [facility]. Thanks

## 2020-04-20 NOTE — Addendum Note (Signed)
Addended bySamuel Bouche on: 04/20/2020 03:37 PM   Modules accepted: Orders

## 2020-04-20 NOTE — Progress Notes (Signed)
Virtual Visit via Video Note  I connected with Douglas Barnes on 04/20/20 at  8:10 AM EST by a video enabled telemedicine application and verified that I am speaking with the correct person using two identifiers.   I discussed the limitations of evaluation and management by telemedicine and the availability of in person appointments. The patient expressed understanding and agreed to proceed.  People involved in care of the patient during this telehealth encounter were myself, my nurse/medical assistant, and my front office/scheduling team member.   Patient location: home Provider locations: office  Subjective:    CC: Upper respiratory symptoms  HPI: Pleasant 64 year old male presenting via [place] video visit with reports of 10 days of upper respiratory symptoms.  Notes that he got sick on 11/13 with sinus congestion, rhinorrhea, cough, nausea, low-grade fever and generalized malaise.  Since then his symptoms have improved somewhat but they have not resolved.  He has been using Alka-Seltzer daytime/nighttime, Hall's cough drops, and Robitussin with moderate relief.  Cough has been dry and hacking but he did cough up some mucus yesterday morning.  Having difficulty sleeping at night because of coughing.  Also notes his chest is sore from the force of his cough.  Denies chills, shortness of breath, chest pain, and diarrhea.  Taking omeprazole 20 mg a day at bedtime to manage GERD.  Feels this works well for him and would like to have a refill if possible.  Past medical history, Surgical history, Family history not pertinant except as noted below, Social history, Allergies, and medications have been entered into the medical record, reviewed, and corrections made.   Review of Systems: See HPI for pertinent positives and negatives.   Objective:    General: Speaking clearly in complete sentences without any shortness of breath.  Alert and oriented x3.  Normal judgment. No apparent acute  distress.  Impression and Recommendations:    1. Sinobronchitis Start Augmentin twice daily x7 days.  Sending in Atrovent twice daily as needed for rhinorrhea.  Hycodan cough syrup for nighttime cough.  May continue using cold meds throughout the day if this is helpful.  Increase p.o. fluids and rest.  2. Gastroesophageal reflux disease, unspecified whether esophagitis present Omeprazole refilled. - omeprazole (PRILOSEC) 20 MG capsule; Take 1 capsule (20 mg total) by mouth at bedtime. **PATIENT NEEDS APT FOR FURTHER REFILLS**  Dispense: 30 capsule; Refill: 0  I discussed the assessment and treatment plan with the patient. The patient was provided an opportunity to ask questions and all were answered. The patient agreed with the plan and demonstrated an understanding of the instructions.   The patient was advised to call back or seek an in-person evaluation if the symptoms worsen or if the condition fails to improve as anticipated.  20 minutes of non-face-to-face time was provided during this encounter.  No follow-ups on file.  Clearnce Sorrel, DNP, APRN, FNP-BC [facility]

## 2020-04-20 NOTE — Telephone Encounter (Signed)
Prescription resent to the [facility] on [address].  [facility] and [facility] that [facility].

## 2020-04-22 ENCOUNTER — Other Ambulatory Visit: Payer: Self-pay | Admitting: Physician Assistant

## 2020-04-22 DIAGNOSIS — E7800 Pure hypercholesterolemia, unspecified: Secondary | ICD-10-CM

## 2020-04-22 DIAGNOSIS — K219 Gastro-esophageal reflux disease without esophagitis: Secondary | ICD-10-CM

## 2020-04-22 DIAGNOSIS — E786 Lipoprotein deficiency: Secondary | ICD-10-CM

## 2020-04-22 DIAGNOSIS — I10 Essential (primary) hypertension: Secondary | ICD-10-CM

## 2020-04-26 ENCOUNTER — Telehealth: Payer: Self-pay | Admitting: Physician Assistant

## 2020-04-26 NOTE — Telephone Encounter (Signed)
Please call patient to schedule follow up apt for med refills. AS, CMA

## 2020-04-27 ENCOUNTER — Encounter: Payer: Self-pay | Admitting: Medical-Surgical

## 2020-04-27 ENCOUNTER — Other Ambulatory Visit: Payer: Self-pay

## 2020-04-27 ENCOUNTER — Ambulatory Visit (INDEPENDENT_AMBULATORY_CARE_PROVIDER_SITE_OTHER): Payer: 59 | Admitting: Physician Assistant

## 2020-04-27 ENCOUNTER — Encounter: Payer: Self-pay | Admitting: Physician Assistant

## 2020-04-27 VITALS — BP 112/72 | HR 95 | Temp 97.7°F | Ht 68.0 in | Wt 177.2 lb

## 2020-04-27 DIAGNOSIS — F430 Acute stress reaction: Secondary | ICD-10-CM

## 2020-04-27 DIAGNOSIS — K219 Gastro-esophageal reflux disease without esophagitis: Secondary | ICD-10-CM | POA: Diagnosis not present

## 2020-04-27 DIAGNOSIS — E7800 Pure hypercholesterolemia, unspecified: Secondary | ICD-10-CM | POA: Diagnosis not present

## 2020-04-27 DIAGNOSIS — F419 Anxiety disorder, unspecified: Secondary | ICD-10-CM

## 2020-04-27 DIAGNOSIS — E786 Lipoprotein deficiency: Secondary | ICD-10-CM

## 2020-04-27 DIAGNOSIS — I10 Essential (primary) hypertension: Secondary | ICD-10-CM

## 2020-04-27 DIAGNOSIS — R7302 Impaired glucose tolerance (oral): Secondary | ICD-10-CM

## 2020-04-27 DIAGNOSIS — F410 Panic disorder [episodic paroxysmal anxiety] without agoraphobia: Secondary | ICD-10-CM

## 2020-04-27 NOTE — Patient Instructions (Signed)
Managing Anxiety, Adult After being diagnosed with an anxiety disorder, you may be relieved to know why you have felt or behaved a certain way. You may also feel overwhelmed about the treatment ahead and what it will mean for your life. With care and support, you can manage this condition and recover from it. How to manage lifestyle changes Managing stress and anxiety  Stress is your body's reaction to life changes and events, both good and bad. Most stress will last just a few hours, but stress can be ongoing and can lead to more than just stress. Although stress can play a major role in anxiety, it is not the same as anxiety. Stress is usually caused by something external, such as a deadline, test, or competition. Stress normally passes after the triggering event has ended.  Anxiety is caused by something internal, such as imagining a terrible outcome or worrying that something will go wrong that will devastate you. Anxiety often does not go away even after the triggering event is over, and it can become long-term (chronic) worry. It is important to understand the differences between stress and anxiety and to manage your stress effectively so that it does not lead to an anxious response. Talk with your health care provider or a counselor to learn more about reducing anxiety and stress. He or she may suggest tension reduction techniques, such as:  Music therapy. This can include creating or listening to music that you enjoy and that inspires you.  Mindfulness-based meditation. This involves being aware of your normal breaths while not trying to control your breathing. It can be done while sitting or walking.  Centering prayer. This involves focusing on a word, phrase, or sacred image that means something to you and brings you peace.  Deep breathing. To do this, expand your stomach and inhale slowly through your nose. Hold your breath for 3-5 seconds. Then exhale slowly, letting your stomach muscles  relax.  Self-talk. This involves identifying thought patterns that lead to anxiety reactions and changing those patterns.  Muscle relaxation. This involves tensing muscles and then relaxing them. Choose a tension reduction technique that suits your lifestyle and personality. These techniques take time and practice. Set aside 5-15 minutes a day to do them. Therapists can offer counseling and training in these techniques. The training to help with anxiety may be covered by some insurance plans. Other things you can do to manage stress and anxiety include:  Keeping a stress/anxiety diary. This can help you learn what triggers your reaction and then learn ways to manage your response.  Thinking about how you react to certain situations. You may not be able to control everything, but you can control your response.  Making time for activities that help you relax and not feeling guilty about spending your time in this way.  Visual imagery and yoga can help you stay calm and relax.  Medicines Medicines can help ease symptoms. Medicines for anxiety include:  Anti-anxiety drugs.  Antidepressants. Medicines are often used as a primary treatment for anxiety disorder. Medicines will be prescribed by a health care provider. When used together, medicines, psychotherapy, and tension reduction techniques may be the most effective treatment. Relationships Relationships can play a big part in helping you recover. Try to spend more time connecting with trusted friends and family members. Consider going to couples counseling, taking family education classes, or going to family therapy. Therapy can help you and others better understand your condition. How to recognize changes in your   anxiety Everyone responds differently to treatment for anxiety. Recovery from anxiety happens when symptoms decrease and stop interfering with your daily activities at home or work. This may mean that you will start to:  Have  better concentration and focus. Worry will interfere less in your daily thinking.  Sleep better.  Be less irritable.  Have more energy.  Have improved memory. It is important to recognize when your condition is getting worse. Contact your health care provider if your symptoms interfere with home or work and you feel like your condition is not improving. Follow these instructions at home: Activity  Exercise. Most adults should do the following: ? Exercise for at least 150 minutes each week. The exercise should increase your heart rate and make you sweat (moderate-intensity exercise). ? Strengthening exercises at least twice a week.  Get the right amount and quality of sleep. Most adults need 7-9 hours of sleep each night. Lifestyle   Eat a healthy diet that includes plenty of vegetables, fruits, whole grains, low-fat dairy products, and lean protein. Do not eat a lot of foods that are high in solid fats, added sugars, or salt.  Make choices that simplify your life.  Do not use any products that contain nicotine or tobacco, such as cigarettes, e-cigarettes, and chewing tobacco. If you need help quitting, ask your health care provider.  Avoid caffeine, alcohol, and certain over-the-counter cold medicines. These may make you feel worse. Ask your pharmacist which medicines to avoid. General instructions  Take over-the-counter and prescription medicines only as told by your health care provider.  Keep all follow-up visits as told by your health care provider. This is important. Where to find support You can get help and support from these sources:  Self-help groups.  Online and community organizations.  A trusted spiritual leader.  Couples counseling.  Family education classes.  Family therapy. Where to find more information You may find that joining a support group helps you deal with your anxiety. The following sources can help you locate counselors or support groups near  you:  Mental Health America: www.mentalhealthamerica.net  Anxiety and Depression Association of America (ADAA): www.adaa.org  National Alliance on Mental Illness (NAMI): www.nami.org Contact a health care provider if you:  Have a hard time staying focused or finishing daily tasks.  Spend many hours a day feeling worried about everyday life.  Become exhausted by worry.  Start to have headaches, feel tense, or have nausea.  Urinate more than normal.  Have diarrhea. Get help right away if you have:  A racing heart and shortness of breath.  Thoughts of hurting yourself or others. If you ever feel like you may hurt yourself or others, or have thoughts about taking your own life, get help right away. You can go to your nearest emergency department or call:  Your local emergency services (911 in the [place]  A suicide crisis helpline, such as the National Suicide Prevention Lifeline at 1-800-273-8255. This is open 24 hours a day. Summary  Taking steps to learn and use tension reduction techniques can help calm you and help prevent triggering an anxiety reaction.  When used together, medicines, psychotherapy, and tension reduction techniques may be the most effective treatment.  Family, friends, and partners can play a big part in helping you recover from an anxiety disorder. This information is not intended to replace advice given to you by your health care provider. Make sure you discuss any questions you have with your health care provider. Document Revised:   10/15/2018 Document Reviewed: 10/15/2018 Elsevier Patient Education  [place].    Panic Attack  A panic attack is when you suddenly feel very afraid, uncomfortable, or nervous (anxious). A panic attack can happen when you are scared or for no reason. A panic attack can feel like a serious problem. It can even feel like a heart attack or stroke. See your doctor when you have a panic attack to make sure you do not  have a serious problem. Follow these instructions at home:  Take medicines only as told by your doctor.  If you feel worried or nervous, try not to have caffeine.  Take good care of your health. To do this: ? Eat healthy. Make sure to eat fresh fruits and vegetables, whole grains, lean meats, and low-fat dairy. ? Get enough sleep. Try to sleep for 7-8 hours each night. ? Exercise. Try to be active for 30 minutes 5 or more days a week. ? Do not smoke. Talk to your doctor if you need help quitting. ? Limit how much alcohol you drink:  If you are a woman who is not pregnant: try not to have more than 1 drink a day.  If you are a man: try not to have more than 2 drinks a day.  One drink equals 12 oz of beer, 5 oz of wine, or 1 oz of hard liquor.  Keep all follow-up visits as told by your doctor. This is important. Contact a doctor if:  Your symptoms do not get better.  Your symptoms get worse.  You are not able to take your medicines as told. Get help right away if:  You have thoughts of hurting yourself or others.  You have symptoms of a panic attack. Do not drive yourself to the hospital. Have someone else drive you or call an ambulance. If you feel like you may hurt yourself or others, or have thoughts about taking your own life, get help right away. You can go to your nearest emergency department or call:  Your local emergency services (911 in the [place]  A suicide crisis helpline, such as the [facility] [place] at 506-234-9112. This is open 24 hours a day. Summary  A panic attack is when you suddenly feel very afraid, uncomfortable, or nervous (anxious).  See your doctor when you have a panic attack to make sure that you do not have another serious problem.  If you feel like you may hurt yourself or others, get help right away by calling 911. This information is not intended to replace advice given to you by your health care provider. Make sure you  discuss any questions you have with your health care provider. Document Revised: 04/27/2017 Document Reviewed: 06/28/2016 Elsevier Patient Education  2020 [employer].

## 2020-04-27 NOTE — Progress Notes (Signed)
Established Patient Office Visit  Subjective:  Patient ID: Douglas Barnes, male    DOB: 10/30/1955  Age: 64 y.o. MRN: [id number]  CC:  Chief Complaint  Patient presents with  . Hyperlipidemia  . Hypertension    HPI Douglas Barnes presents for follow up on hypertension, hyperlipidemia and prediabetes. Reports has been experiencing anxiety and panic attacks. Stress triggers those episodes. Denies prior history of anxiety and panic disorder.   HTN: Pt denies chest pain, palpitations, dizziness or lower extremity swelling. Taking medication as directed without side effects. Checks BP at home and readings range 110-120/70s. Pt reports good hydration. Continues to go to the gym twice per week. Has lost significant weight and is inquiring about stopping one of the BP medications.  HLD: Pt taking medication as directed without issues. Denies side effects.  GERD: States has started taking omeprazole at nighttime and has noticed an improvement with heartburn symptoms versus taking medication in the morning.  He likes spicy foods which can trigger heartburn.   Past Medical History:  Diagnosis Date  . BPH (benign prostatic hypertrophy) with urinary retention   . Complication of anesthesia    "fear of not waking up"  . Foley catheter in place   . Hepatitis C antibody test positive    dx 1997--  pt is getting treatment in near future (last enzymes 12 /2016 stable per pt and in epic)  . History of colon polyps   . History of hypertension    no medication since 2014 lost weight and decrease stress  . Hypertension     Past Surgical History:  Procedure Laterality Date  . ANAL FISSURE REPAIR    . COLONOSCOPY WITH PROPOFOL  last one 2015  . GREEN LIGHT LASER TURP (TRANSURETHRAL RESECTION OF PROSTATE N/A 07/09/2015   Procedure: GREEN LIGHT LASER TURP (TRANSURETHRAL RESECTION OF PROSTATE;  Surgeon: Douglas Aloe, MD;  Location: [facility];  Service: Urology;   Laterality: N/A;  . INGUINAL HERNIA REPAIR Right age 25  . TRANSURETHRAL RESECTION OF PROSTATE N/A 07/09/2015   Procedure: CYSTOSCOPY WITH CLOT EVACUATION //FULGERATION OF BLEEDERS;  Surgeon: Douglas Frock, MD;  Location: [facility];  Service: Urology;  Laterality: N/A;    Family History  Problem Relation Age of Onset  . Cancer Mother 33       BONE  . Cancer Father 63       COLON  . Hypertension Brother   . Heart disease Brother   . Heart disease Brother   . Hypertension Brother     Social History   Socioeconomic History  . Marital status: Married    Spouse name: Not on file  . Number of children: 3  . Years of education: Not on file  . Highest education level: Not on file  Occupational History  . Not on file  Tobacco Use  . Smoking status: Former Smoker    Packs/day: 0.25    Years: 25.00    Pack years: 6.25    Types: Cigarettes    Quit date: 04/02/2014    Years since quitting: 6.0  . Smokeless tobacco: Never Used  Vaping Use  . Vaping Use: Never used  Substance and Sexual Activity  . Alcohol use: No  . Drug use: Yes    Types: Marijuana  . Sexual activity: Yes    Birth control/protection: Condom  Other Topics Concern  . Not on file  Social History Narrative  . Not on file   Social Determinants of  Health   Financial Resource Strain:   . Difficulty of Paying Living Expenses: Not on file  Food Insecurity:   . Worried About Charity fundraiser in the Last Year: Not on file  . Ran Out of Food in the Last Year: Not on file  Transportation Needs:   . Lack of Transportation (Medical): Not on file  . Lack of Transportation (Non-Medical): Not on file  Physical Activity:   . Days of Exercise per Week: Not on file  . Minutes of Exercise per Session: Not on file  Stress:   . Feeling of Stress : Not on file  Social Connections:   . Frequency of Communication with Friends and Family: Not on file  . Frequency of Social Gatherings with Friends and Family: Not on file  .  Attends Religious Services: Not on file  . Active Member of Clubs or Organizations: Not on file  . Attends Archivist Meetings: Not on file  . Marital Status: Not on file  Intimate Partner Violence:   . Fear of Current or Ex-Partner: Not on file  . Emotionally Abused: Not on file  . Physically Abused: Not on file  . Sexually Abused: Not on file    Outpatient Medications Prior to Visit  Medication Sig Dispense Refill  . amoxicillin-clavulanate (AUGMENTIN) 875-125 MG tablet Take 1 tablet by mouth 2 (two) times daily. 14 tablet 0  . atorvastatin (LIPITOR) 80 MG tablet Take 0.5 tablets (40 mg total) by mouth daily. **NEED APT FOR REFILLS** 15 tablet 0  . finasteride (PROSCAR) 5 MG tablet Take 5 mg by mouth daily.  3  . HYDROcodone-homatropine (HYCODAN) 5-1.5 MG/5ML syrup Take 5 mLs by mouth every 8 (eight) hours as needed for cough. 60 mL 0  . ipratropium (ATROVENT) 0.03 % nasal spray Place 2 sprays into both nostrils every 12 (twelve) hours. 30 mL 0  . losartan-hydrochlorothiazide (HYZAAR) 100-25 MG tablet TAKE 1 TABLET BY MOUTH DAILY. **PATIENT NEEDS APT FOR FURTHER REFILLS** 30 tablet 0  . omeprazole (PRILOSEC) 20 MG capsule TAKE 1 CAPSULE (20 MG TOTAL) BY MOUTH AT BEDTIME. **PATIENT NEEDS APT FOR FURTHER REFILLS** 30 capsule 0  . sildenafil (VIAGRA) 100 MG tablet 1/2 TABLET EVERY DAY AS NEEDED    . amLODipine (NORVASC) 10 MG tablet TAKE 1 TABLET (10 MG TOTAL) BY MOUTH DAILY. **PATIENT NEEDS APT FOR FURTHER REFILLS** 30 tablet 0   No facility-administered medications prior to visit.    No Known Allergies  ROS Review of Systems A fourteen system review of systems was performed and found to be positive as per HPI. Objective:    Physical Exam General:  Well Developed, well nourished, appropriate for stated age.  Neuro:  Alert and oriented,  extra-ocular muscles intact, no focal deficits  HEENT:  Normocephalic, atraumatic, neck supple,  Skin:  no gross rash, warm,  pink. Cardiac:  RRR, S1 S2 Respiratory:  ECTA B/L, Not using accessory muscles, speaking in full sentences- unlabored. Vascular:  Ext warm, no cyanosis apprec.; cap RF less 2 sec. Psych:  No HI/SI, judgement and insight good, Euthymic mood. Full Affect.  BP 112/72   Pulse 95   Temp 97.7 F (36.5 C) (Oral)   Ht 5\' 8"  (1.727 m)   Wt 177 lb 3.2 oz (80.4 kg)   SpO2 97% Comment: on RA  BMI 26.94 kg/m  Wt Readings from Last 3 Encounters:  04/27/20 177 lb 3.2 oz (80.4 kg)  04/20/20 160 lb (72.6 kg)  09/16/19 187 lb (  84.8 kg)     Health Maintenance Due  Topic Date Due  . COVID-19 Vaccine (1) Never done  . HIV Screening  Never done  . INFLUENZA VACCINE  12/28/2019    There are no preventive care reminders to display for this patient.  Lab Results  Component Value Date   TSH 2.500 04/08/2019   Lab Results  Component Value Date   WBC 9.2 04/08/2019   HGB 15.3 04/08/2019   HCT 44.5 04/08/2019   MCV 89 04/08/2019   PLT 257 04/08/2019   Lab Results  Component Value Date   NA 143 04/08/2019   K 3.8 04/08/2019   CO2 24 04/08/2019   GLUCOSE 117 (H) 04/08/2019   BUN 11 04/08/2019   CREATININE 0.91 04/08/2019   BILITOT 1.0 04/08/2019   ALKPHOS 67 04/08/2019   AST 12 04/08/2019   ALT 13 04/08/2019   PROT 7.4 04/08/2019   ALBUMIN 4.6 04/08/2019   CALCIUM 9.3 04/08/2019   ANIONGAP 7 01/26/2016   Lab Results  Component Value Date   CHOL 95 (L) 09/19/2019   Lab Results  Component Value Date   HDL 32 (L) 09/19/2019   Lab Results  Component Value Date   LDLCALC 44 09/19/2019   Lab Results  Component Value Date   TRIG 101 09/19/2019   Lab Results  Component Value Date   CHOLHDL 3.0 09/19/2019   Lab Results  Component Value Date   HGBA1C 6.2 (H) 09/19/2019      Assessment & Plan:   Problem List Items Addressed This Visit      Digestive   GERD (gastroesophageal reflux disease) (Chronic)     Other   Elevated LDL cholesterol level   Anxiety    Other  Visit Diagnoses    Essential hypertension    -  Primary   Panic attack as reaction to stress         Essential hypertension: -BP today is well controlled.  Discussed with patient reasonable to discontinue one antihypertensive medication so will stop amlodipine and continue losartan-HCTZ.  Advised to continue monitoring BP and pulse at home and if starts noticing blood pressure increased and is consistently > 130/80 to notify the clinic and will restart amlodipine.  Patient verbalized understanding and agreeable. -Recommend to continue to stay active, follow low-sodium diet and stay well-hydrated. -Will continue to monitor. -Advised patient to schedule lab visit for fasting blood work.  Elevated LDL cholesterol level: -Last lipid panel: Total cholesterol 95, triglycerides 101, HDL 32, LDL 44 -Continue current medication regimen. -Recommend to follow a heart healthy diet. -Continue to stay active. -Plan to repeat lipid panel and hepatic function with fasting blood work.  Anxiety, Panic attack as reaction to stress: -Discussed with patient management options and declined starting medication therapy and psychology referral. -Recommend nonpharmacological therapy such as mindfulness therapy and relaxation techniques such as deep breathing.  GERD: -Fairly controlled. Continue current medication regimen. -Recommend to reduce/avoid provocative foods.  No orders of the defined types were placed in this encounter.   Follow-up: Return in about 4 months (around 08/25/2020) for HTN, HLD; FBW in Jan 2022.   Note:  This note was prepared with assistance of Dragon voice recognition software. Occasional wrong-word or sound-a-like substitutions may have occurred due to the inherent limitations of voice recognition software.  Lorrene Reid, PA-C

## 2020-04-28 MED ORDER — LOSARTAN POTASSIUM-HCTZ 100-25 MG PO TABS: 1.0000 | ORAL_TABLET | Freq: Every day | ORAL | 1 refills | Status: DC

## 2020-04-28 MED ORDER — OMEPRAZOLE 20 MG PO CPDR: 20.0000 mg | DELAYED_RELEASE_CAPSULE | Freq: Every day | ORAL | 1 refills | Status: DC

## 2020-04-28 MED ORDER — ATORVASTATIN CALCIUM 80 MG PO TABS: 40.0000 mg | ORAL_TABLET | Freq: Every day | ORAL | 1 refills | Status: DC

## 2020-05-12 ENCOUNTER — Other Ambulatory Visit: Payer: Self-pay | Admitting: Medical-Surgical

## 2020-05-18 ENCOUNTER — Other Ambulatory Visit: Payer: Self-pay | Admitting: Physician Assistant

## 2020-05-18 DIAGNOSIS — K219 Gastro-esophageal reflux disease without esophagitis: Secondary | ICD-10-CM

## 2020-05-18 DIAGNOSIS — I10 Essential (primary) hypertension: Secondary | ICD-10-CM

## 2020-06-18 ENCOUNTER — Other Ambulatory Visit: Payer: Self-pay | Admitting: Medical-Surgical

## 2020-06-18 NOTE — Telephone Encounter (Signed)
Routing to PCP

## 2020-07-02 ENCOUNTER — Other Ambulatory Visit: Payer: Self-pay | Admitting: Physician Assistant

## 2020-08-11 ENCOUNTER — Telehealth: Payer: Self-pay | Admitting: Physician Assistant

## 2020-08-11 NOTE — Telephone Encounter (Signed)
Patient went to the fire department for a heart attack and it turns it was panic attacks, three this week so far. Patient has never had panic attacks until recently. Please advise, thanks.

## 2020-08-11 NOTE — Telephone Encounter (Signed)
Please schedule this pt.

## 2020-08-11 NOTE — Telephone Encounter (Signed)
Patient scheduled for tomorrow at 9:30

## 2020-08-11 NOTE — Telephone Encounter (Signed)
Ok. Thanks!

## 2020-08-12 ENCOUNTER — Ambulatory Visit: Payer: 59 | Admitting: Nurse Practitioner

## 2020-08-18 ENCOUNTER — Ambulatory Visit: Payer: 59 | Admitting: Nurse Practitioner

## 2021-02-05 ENCOUNTER — Other Ambulatory Visit: Payer: Self-pay | Admitting: Physician Assistant

## 2021-02-05 DIAGNOSIS — I10 Essential (primary) hypertension: Secondary | ICD-10-CM

## 2021-02-05 DIAGNOSIS — K219 Gastro-esophageal reflux disease without esophagitis: Secondary | ICD-10-CM

## 2021-02-17 ENCOUNTER — Other Ambulatory Visit: Payer: Self-pay

## 2021-02-17 ENCOUNTER — Ambulatory Visit (INDEPENDENT_AMBULATORY_CARE_PROVIDER_SITE_OTHER): Payer: 59 | Admitting: Physician Assistant

## 2021-02-17 ENCOUNTER — Encounter: Payer: Self-pay | Admitting: Physician Assistant

## 2021-02-17 VITALS — BP 105/72 | HR 82 | Temp 98.2°F | Ht 68.5 in | Wt 180.8 lb

## 2021-02-17 DIAGNOSIS — F419 Anxiety disorder, unspecified: Secondary | ICD-10-CM

## 2021-02-17 DIAGNOSIS — Z23 Encounter for immunization: Secondary | ICD-10-CM

## 2021-02-17 DIAGNOSIS — E559 Vitamin D deficiency, unspecified: Secondary | ICD-10-CM

## 2021-02-17 DIAGNOSIS — F40243 Fear of flying: Secondary | ICD-10-CM

## 2021-02-17 DIAGNOSIS — E786 Lipoprotein deficiency: Secondary | ICD-10-CM

## 2021-02-17 DIAGNOSIS — E7800 Pure hypercholesterolemia, unspecified: Secondary | ICD-10-CM

## 2021-02-17 DIAGNOSIS — Z0000 Encounter for general adult medical examination without abnormal findings: Secondary | ICD-10-CM

## 2021-02-17 DIAGNOSIS — N529 Male erectile dysfunction, unspecified: Secondary | ICD-10-CM

## 2021-02-17 DIAGNOSIS — I10 Essential (primary) hypertension: Secondary | ICD-10-CM

## 2021-02-17 DIAGNOSIS — F410 Panic disorder [episodic paroxysmal anxiety] without agoraphobia: Secondary | ICD-10-CM

## 2021-02-17 DIAGNOSIS — K219 Gastro-esophageal reflux disease without esophagitis: Secondary | ICD-10-CM

## 2021-02-17 DIAGNOSIS — F430 Acute stress reaction: Secondary | ICD-10-CM

## 2021-02-17 MED ORDER — ATORVASTATIN CALCIUM 80 MG PO TABS: 40.0000 mg | ORAL_TABLET | Freq: Every day | ORAL | 1 refills | Status: DC

## 2021-02-17 MED ORDER — LOSARTAN POTASSIUM-HCTZ 100-25 MG PO TABS: 1.0000 | ORAL_TABLET | Freq: Every day | ORAL | 1 refills | Status: DC

## 2021-02-17 MED ORDER — SILDENAFIL CITRATE 100 MG PO TABS: ORAL_TABLET | ORAL | 1 refills | Status: AC

## 2021-02-17 MED ORDER — ALPRAZOLAM 0.5 MG PO TABS: ORAL_TABLET | ORAL | 0 refills | Status: DC

## 2021-02-17 MED ORDER — OMEPRAZOLE 20 MG PO CPDR
20.0000 mg | DELAYED_RELEASE_CAPSULE | Freq: Every day | ORAL | 1 refills | Status: DC
Start: 2021-02-17 — End: 2021-03-11

## 2021-02-17 NOTE — Progress Notes (Signed)
Established Patient Office Visit  Subjective:  Patient ID: Douglas Barnes, male    DOB: 01-25-1956  Age: 65 y.o. MRN: [id number]  CC:  Chief Complaint  Patient presents with   Follow-up   Gastroesophageal Reflux    HPI Douglas Barnes presents for follow up on hypertension, GERD and hyperlipidemia. 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 Has a trip coming up. Requesting medication refills.  GERD: States medication has not been working as well as before. Wakes up in the morning with belching and when eating spicy foods has more lasting symptoms.  HTN: Pt denies chest pain, palpitations, dizziness or lower extremity swelling. Taking medication as directed without side effects. Patient does check BP at home and readings <130/80. Pt follows a low salt diet.  Mood: Patient reports overall managing ok without medication. States sometimes does have panic attacks which seem to come out of nowhere. Also reports noticing being a more emotional than before. Working in a new position with [employer].   Past Medical History:  Diagnosis Date   BPH (benign prostatic hypertrophy) with urinary retention    Complication of anesthesia    "fear of not waking up"   Foley catheter in place    Hepatitis C antibody test positive    dx 1997--  pt is getting treatment in near future (last enzymes 12 /2016 stable per pt and in epic)   History of colon polyps    History of hypertension    no medication since 2014 lost weight and decrease stress   Hypertension     Past Surgical History:  Procedure Laterality Date   ANAL FISSURE REPAIR     COLONOSCOPY WITH PROPOFOL  last one 2015   GREEN LIGHT LASER TURP (TRANSURETHRAL RESECTION OF PROSTATE N/A 07/09/2015   Procedure: GREEN LIGHT LASER TURP (TRANSURETHRAL RESECTION OF PROSTATE;  Surgeon: Festus Aloe, MD;  Location: [facility];  Service: Urology;  Laterality: N/A;   INGUINAL  HERNIA REPAIR Right age 24   TRANSURETHRAL RESECTION OF PROSTATE N/A 07/09/2015   Procedure: CYSTOSCOPY WITH CLOT EVACUATION //FULGERATION OF BLEEDERS;  Surgeon: Alexis Frock, MD;  Location: [facility];  Service: Urology;  Laterality: N/A;    Family History  Problem Relation Age of Onset   Cancer Mother 32       BONE   Cancer Father 53       COLON   Hypertension Brother    Heart disease Brother    Heart disease Brother    Hypertension Brother     Social History   Socioeconomic History   Marital status: Married    Spouse name: Not on file   Number of children: 3   Years of education: Not on file   Highest education level: Not on file  Occupational History   Not on file  Tobacco Use   Smoking status: Former    Packs/day: 0.25    Years: 25.00    Pack years: 6.25    Types: Cigarettes    Quit date: 04/02/2014    Years since quitting: 6.8   Smokeless tobacco: Never  Vaping Use   Vaping Use: Never used  Substance and Sexual Activity   Alcohol use: No   Drug use: Yes    Types: Marijuana   Sexual activity: Yes    Birth control/protection: Condom  Other Topics Concern   Not on file  Social History Narrative   Not on file  Social Determinants of Health   Financial Resource Strain: Not on file  Food Insecurity: Not on file  Transportation Needs: Not on file  Physical Activity: Not on file  Stress: Not on file  Social Connections: Not on file  Intimate Partner Violence: Not on file    Outpatient Medications Prior to Visit  Medication Sig Dispense Refill   finasteride (PROSCAR) 5 MG tablet Take 5 mg by mouth daily.  3   atorvastatin (LIPITOR) 80 MG tablet Take 0.5 tablets (40 mg total) by mouth daily. 90 tablet 1   losartan-hydrochlorothiazide (HYZAAR) 100-25 MG tablet Take 1 tablet by mouth daily. **PLEASE CONTACT OUR OFFICE TO SCHEDULE A FOLLOW UP APPOINTMENT** 30 tablet 0   omeprazole (PRILOSEC) 20 MG capsule Take 1 capsule (20 mg total) by mouth daily. **PLEASE  CONTACT OUR OFFICE TO SCHEDULE A FOLLOW UP APPOINTMENT** 30 capsule 0   sildenafil (VIAGRA) 100 MG tablet 1/2 TABLET EVERY DAY AS NEEDED     amoxicillin-clavulanate (AUGMENTIN) 875-125 MG tablet Take 1 tablet by mouth 2 (two) times daily. 14 tablet 0   HYDROcodone-homatropine (HYCODAN) 5-1.5 MG/5ML syrup Take 5 mLs by mouth every 8 (eight) hours as needed for cough. 60 mL 0   ipratropium (ATROVENT) 0.03 % nasal spray PLACE 2 SPRAYS INTO BOTH NOSTRILS EVERY 12 (TWELVE) HOURS. 30 mL 0   No facility-administered medications prior to visit.    No Known Allergies  ROS Review of Systems A fourteen system review of systems was performed and found to be positive as per HPI.   Objective:    Physical Exam General:  Well Developed, well nourished, appropriate for stated age.  Neuro:  Alert and oriented,  extra-ocular muscles intact  HEENT:  Normocephalic, atraumatic, neck supple Skin:  no gross rash, warm, pink. Cardiac:  RRR, S1 S2 Respiratory:  CTA B/L w/o wheezing, Not using accessory muscles, speaking in full sentences- unlabored. Vascular:  Ext warm, no cyanosis apprec.; cap RF less 2 sec. Psych:  No HI/SI, judgement and insight good, Euthymic mood. Full Affect.  BP 105/72   Pulse 82   Temp 98.2 F (36.8 C)   Ht 5' 8.5" (1.74 m)   Wt 180 lb 12.8 oz (82 kg)   SpO2 98%   BMI 27.09 kg/m  Wt Readings from Last 3 Encounters:  02/17/21 180 lb 12.8 oz (82 kg)  04/27/20 177 lb 3.2 oz (80.4 kg)  04/20/20 160 lb (72.6 kg)     Health Maintenance Due  Topic Date Due   HIV Screening  Never done   Zoster Vaccines- Shingrix (1 of 2) Never done   COVID-19 Vaccine (3 - Pfizer risk series) 09/26/2019    There are no preventive care reminders to display for this patient.  Lab Results  Component Value Date   TSH 1.320 02/17/2021   Lab Results  Component Value Date   WBC 8.9 02/17/2021   HGB 14.9 02/17/2021   HCT 43.2 02/17/2021   MCV 90 02/17/2021   PLT 292 02/17/2021   Lab  Results  Component Value Date   NA 140 02/17/2021   K 3.5 02/17/2021   CO2 25 02/17/2021   GLUCOSE 99 02/17/2021   BUN 13 02/17/2021   CREATININE 0.97 02/17/2021   BILITOT 0.7 02/17/2021   ALKPHOS 61 02/17/2021   AST 17 02/17/2021   ALT 15 02/17/2021   PROT 7.5 02/17/2021   ALBUMIN 4.8 02/17/2021   CALCIUM 9.5 02/17/2021   ANIONGAP 7 01/26/2016   EGFR 88 02/17/2021  Lab Results  Component Value Date   CHOL 95 (L) 09/19/2019   Lab Results  Component Value Date   HDL 32 (L) 09/19/2019   Lab Results  Component Value Date   LDLCALC 44 09/19/2019   Lab Results  Component Value Date   TRIG 101 09/19/2019   Lab Results  Component Value Date   CHOLHDL 3.0 09/19/2019   Lab Results  Component Value Date   HGBA1C 6.2 (H) 02/17/2021   Depression screen PHQ 2/9 02/17/2021 09/16/2019 07/03/2019 03/14/2019 09/24/2018  Decreased Interest 1 0 1 0 0  Down, Depressed, Hopeless 0 0 0 0 0  PHQ - 2 Score 1 0 1 0 0  Altered sleeping 1 0 3 0 0  Tired, decreased energy 1 0 0 0 3  Change in appetite 0 0 0 0 0  Feeling bad or failure about yourself  0 0 0 0 0  Trouble concentrating 1 0 0 0 0  Moving slowly or fidgety/restless 0 0 0 0 0  Suicidal thoughts 0 0 0 0 0  PHQ-9 Score 4 0 4 0 3  Difficult doing work/chores Somewhat difficult - Not difficult at all Not difficult at all Not difficult at all  Some recent data might be hidden   GAD 7 : Generalized Anxiety Score 02/17/2021 09/16/2019  Nervous, Anxious, on Edge 1 0  Control/stop worrying 0 0  Worry too much - different things 0 0  Trouble relaxing 1 0  Restless 3 0  Easily annoyed or irritable 1 0  Afraid - awful might happen 0 0  Total GAD 7 Score 6 0  Anxiety Difficulty Somewhat difficult -        Assessment & Plan:   Problem List Items Addressed This Visit       Digestive   GERD (gastroesophageal reflux disease) (Chronic)   Relevant Medications   omeprazole (PRILOSEC) 20 MG capsule     Other   Elevated LDL  cholesterol level   Relevant Medications   atorvastatin (LIPITOR) 80 MG tablet   Other Relevant Orders   Direct LDL (Completed)   Low HDL (under 40)   Relevant Medications   atorvastatin (LIPITOR) 80 MG tablet   Vitamin D deficiency   Relevant Orders   Vitamin D (25 hydroxy) (Completed)   Other Visit Diagnoses     Essential hypertension    -  Primary   Relevant Medications   atorvastatin (LIPITOR) 80 MG tablet   losartan-hydrochlorothiazide (HYZAAR) 100-25 MG tablet   sildenafil (VIAGRA) 100 MG tablet   Other Relevant Orders   Comp Met (CMET) (Completed)   CBC w/Diff (Completed)   Need for influenza vaccination       Relevant Orders   Flu Vaccine QUAD 61moIM (Fluarix, Fluzone & Alfiuria Quad PF) (Completed)   Erectile dysfunction, unspecified erectile dysfunction type       Relevant Medications   sildenafil (VIAGRA) 100 MG tablet   Panic attack as reaction to stress       Relevant Medications   ALPRAZolam (XANAX) 0.5 MG tablet   Healthcare maintenance       Relevant Orders   TSH (Completed)   HgB A1c (Completed)   Flying phobia       Relevant Medications   ALPRAZolam (XANAX) 0.5 MG tablet      Essential hypertension: -Controlled. -Continue current medication regimen. Will collect CMP for medication monitoring. -Will continue to monitor.  GERD: -Discussed with patient medication adjustments and recommend to continue omeprazole 20 mg  daily and take H2 blocker for acute exacerbation and avoid provocative foods. If symptoms fail to improve or worsen then will consider increasing omeprazole to 40 mg daily.  Elevated LDL cholesterol level, Low HDL: -Will collect direct LDL and hepatic function. -Continue current medication regimen.  Anxiety, Panic attack as reaction to stress: -GAD-7 score of 6, PHQ-9 score of 4. Discussed with patient management options including daily med (SSRI or SNRI) or prn medication such as hydroxyzine for severe anxiety or panic attack. Patient  prefers to continue with nonpharmacologic therapy. Advised to let me know if decides to pursue medication therapy.  -Reviewed med hx, one rx for alprazolam 0.5 mg 1 tab BID Q: 10 tabs prescribed 09/11/2016.  ED: -Stable. -BP and pulse stable. -Continue current medication regimen.  Healthcare Maintenance: -Will collect labs including Vit D. -Recommend to schedule CPE. -Agreeable to influenza. -UTD colonoscopy.   Meds ordered this encounter  Medications   atorvastatin (LIPITOR) 80 MG tablet    Sig: Take 0.5 tablets (40 mg total) by mouth daily.    Dispense:  45 tablet    Refill:  1   losartan-hydrochlorothiazide (HYZAAR) 100-25 MG tablet    Sig: Take 1 tablet by mouth daily.    Dispense:  90 tablet    Refill:  1   sildenafil (VIAGRA) 100 MG tablet    Sig: 1/2 TABLET EVERY DAY AS NEEDED    Dispense:  10 tablet    Refill:  1   omeprazole (PRILOSEC) 20 MG capsule    Sig: Take 1 capsule (20 mg total) by mouth daily.    Dispense:  90 capsule    Refill:  1    Order Specific Question:   Supervising Provider    Answer:   Beatrice Lecher D [[id number]]   ALPRAZolam (XANAX) 0.5 MG tablet    Sig: Take 1-2 tablets by mouth 30 minutes before flying.    Dispense:  10 tablet    Refill:  0    Order Specific Question:   Supervising Provider    Answer:   Beatrice Lecher D [[id number]]    Follow-up: Return in about 6 months (around 08/17/2021) for CPE .    Lorrene Reid, PA-C

## 2021-02-18 LAB — COMPREHENSIVE METABOLIC PANEL
ALT: 15 IU/L (ref 0–44)
AST: 17 IU/L (ref 0–40)
Albumin/Globulin Ratio: 1.8 (ref 1.2–2.2)
Albumin: 4.8 g/dL (ref 3.8–4.8)
Alkaline Phosphatase: 61 IU/L (ref 44–121)
BUN/Creatinine Ratio: 13 (ref 10–24)
BUN: 13 mg/dL (ref 8–27)
Bilirubin Total: 0.7 mg/dL (ref 0.0–1.2)
CO2: 25 mmol/L (ref 20–29)
Calcium: 9.5 mg/dL (ref 8.6–10.2)
Chloride: 99 mmol/L (ref 96–106)
Creatinine, Ser: 0.97 mg/dL (ref 0.76–1.27)
Globulin, Total: 2.7 g/dL (ref 1.5–4.5)
Glucose: 99 mg/dL (ref 65–99)
Potassium: 3.5 mmol/L (ref 3.5–5.2)
Sodium: 140 mmol/L (ref 134–144)
Total Protein: 7.5 g/dL (ref 6.0–8.5)
eGFR: 88 mL/min/{1.73_m2} (ref >59)

## 2021-02-18 LAB — CBC WITH DIFFERENTIAL/PLATELET
Basophils Absolute: 0 10*3/uL (ref 0.0–0.2)
Basos: 1 %
EOS (ABSOLUTE): 0.1 10*3/uL (ref 0.0–0.4)
Eos: 1 %
Hematocrit: 43.2 % (ref 37.5–51.0)
Hemoglobin: 14.9 g/dL (ref 13.0–17.7)
Immature Grans (Abs): 0 10*3/uL (ref 0.0–0.1)
Immature Granulocytes: 0 %
Lymphocytes Absolute: 2.2 10*3/uL (ref 0.7–3.1)
Lymphs: 25 %
MCH: 30.9 pg (ref 26.6–33.0)
MCHC: 34.5 g/dL (ref 31.5–35.7)
MCV: 90 fL (ref 79–97)
Monocytes Absolute: 0.5 10*3/uL (ref 0.1–0.9)
Monocytes: 5 %
Neutrophils Absolute: 6.1 10*3/uL (ref 1.4–7.0)
Neutrophils: 68 %
Platelets: 292 10*3/uL (ref 150–450)
RBC: 4.82 x10E6/uL (ref 4.14–5.80)
RDW: 12.2 % (ref 11.6–15.4)
WBC: 8.9 10*3/uL (ref 3.4–10.8)

## 2021-02-18 LAB — HEMOGLOBIN A1C
Est. average glucose Bld gHb Est-mCnc: 131 mg/dL
Hgb A1c MFr Bld: 6.2 % — ABNORMAL HIGH (ref 4.8–5.6)

## 2021-02-18 LAB — VITAMIN D 25 HYDROXY (VIT D DEFICIENCY, FRACTURES): Vit D, 25-Hydroxy: 35.7 ng/mL (ref 30.0–100.0)

## 2021-02-18 LAB — TSH: TSH: 1.32 u[IU]/mL (ref 0.450–4.500)

## 2021-02-18 LAB — LDL CHOLESTEROL, DIRECT: LDL Direct: 53 mg/dL (ref 0–99)

## 2021-03-11 ENCOUNTER — Other Ambulatory Visit: Payer: Self-pay | Admitting: Physician Assistant

## 2021-03-11 DIAGNOSIS — K219 Gastro-esophageal reflux disease without esophagitis: Secondary | ICD-10-CM

## 2021-03-23 ENCOUNTER — Other Ambulatory Visit: Payer: Self-pay | Admitting: Urology

## 2021-03-23 DIAGNOSIS — C61 Malignant neoplasm of prostate: Secondary | ICD-10-CM

## 2021-04-07 ENCOUNTER — Ambulatory Visit
Admission: RE | Admit: 2021-04-07 | Discharge: 2021-04-07 | Disposition: A | Discharge status: Home / Self Care | Payer: 59 | Source: Ambulatory Visit | Attending: Urology | Admitting: Urology

## 2021-04-07 ENCOUNTER — Other Ambulatory Visit: Payer: Self-pay

## 2021-04-07 DIAGNOSIS — C61 Malignant neoplasm of prostate: Secondary | ICD-10-CM

## 2021-04-07 MED ORDER — GADOBENATE DIMEGLUMINE 529 MG/ML IV SOLN
15.0000 mL | Freq: Once | INTRAVENOUS | Status: AC | PRN
  Administered 2021-04-07: 15 mL via INTRAVENOUS

## 2021-05-10 ENCOUNTER — Encounter: Payer: Self-pay | Admitting: Physician Assistant

## 2021-06-13 ENCOUNTER — Telehealth: Payer: Self-pay | Admitting: Physician Assistant

## 2021-06-13 NOTE — Telephone Encounter (Signed)
Patient requesting a refill of Omeprazole. Please advise. [phone]

## 2021-06-14 ENCOUNTER — Other Ambulatory Visit: Payer: Self-pay

## 2021-06-14 DIAGNOSIS — K219 Gastro-esophageal reflux disease without esophagitis: Secondary | ICD-10-CM

## 2021-06-14 MED ORDER — OMEPRAZOLE 20 MG PO CPDR: DELAYED_RELEASE_CAPSULE | ORAL | 1 refills | Status: DC

## 2021-06-14 NOTE — Telephone Encounter (Signed)
It was sent over to [facility] however he is saying they are giving him the "run around" and not filling his prescriptions correctly so he asked me to change his pharmacy to [facility] which I did and he is now requesting a new prescription of the Omeprazole.

## 2021-06-14 NOTE — Telephone Encounter (Signed)
Patient is aware and appointment is scheduled

## 2021-07-26 ENCOUNTER — Other Ambulatory Visit: Payer: Self-pay

## 2021-07-26 ENCOUNTER — Encounter: Payer: Self-pay | Admitting: Physician Assistant

## 2021-07-26 ENCOUNTER — Ambulatory Visit (INDEPENDENT_AMBULATORY_CARE_PROVIDER_SITE_OTHER): Payer: Self-pay | Admitting: Physician Assistant

## 2021-07-26 VITALS — Temp 98.7°F | Ht 68.0 in | Wt 172.0 lb

## 2021-07-26 DIAGNOSIS — K8020 Calculus of gallbladder without cholecystitis without obstruction: Secondary | ICD-10-CM | POA: Insufficient documentation

## 2021-07-26 DIAGNOSIS — U071 COVID-19: Secondary | ICD-10-CM

## 2021-07-26 MED ORDER — MOLNUPIRAVIR EUA 200MG CAPSULE: 4.0000 | ORAL_CAPSULE | Freq: Two times a day (BID) | ORAL | 0 refills | Status: AC

## 2021-07-26 NOTE — Progress Notes (Signed)
Telehealth office visit note for Douglas Reid, PA-C- at Primary Care at [facility]   I connected with current patient today by telephone and verified that I am speaking with the correct person    Location of the patient: Home  Location of the provider: Office - This visit type was conducted due to national recommendations for restrictions regarding the COVID-19 Pandemic (e.g. social distancing) in an effort to limit this patient's exposure and mitigate transmission in our community.    - No physical exam could be performed with this format, beyond that communicated to [place] by the patient/ family members as noted.   - Additionally my office staff/ schedulers were to discuss with the patient that there may be a monetary charge related to this service, depending on their medical insurance.  My understanding is that patient understood and consented to proceed.     _________________________________________________________________________________   History of Present Illness: Patient calls in with c/o Covid-19 infection. Patient reports tested positive yesterday. Patient's wife is also on the line and states his symptoms started Friday (4 days ago) with hoarseness and over the weekend patient felt he had a cold. Yesterday had nausea and vomiting. His max temp 101.2 and has been taking Tylenol 1000 mg which helps with the fever. Also reports decreased appetite, fatigue, mild cough, some wheezing, chest congestion and slight headache. Denies chest pain, palpitations, shortness of breat or altered mental status. Has been taking DayQuil and NyQuil with mild relief.    GAD 7 : Generalized Anxiety Score 02/17/2021 09/16/2019  Nervous, Anxious, on Edge 1 0  Control/stop worrying 0 0  Worry too much - different things 0 0  Trouble relaxing 1 0  Restless 3 0  Easily annoyed or irritable 1 0  Afraid - awful might happen 0 0  Total GAD 7 Score 6 0  Anxiety Difficulty Somewhat difficult -     Depression screen [facility] 2/9 07/26/2021 02/17/2021 09/16/2019 07/03/2019 03/14/2019  Decreased Interest 0 1 0 1 0  Down, Depressed, Hopeless 0 0 0 0 0  PHQ - 2 Score 0 1 0 1 0  Altered sleeping - 1 0 3 0  Tired, decreased energy - 1 0 0 0  Change in appetite - 0 0 0 0  Feeling bad or failure about yourself  - 0 0 0 0  Trouble concentrating - 1 0 0 0  Moving slowly or fidgety/restless - 0 0 0 0  Suicidal thoughts - 0 0 0 0  PHQ-9 Score - 4 0 4 0  Difficult doing work/chores - Somewhat difficult - Not difficult at all Not difficult at all  Some recent data might be hidden      Impression and Recommendations:     1. COVID-19   -Patient has 3 QQPYP-95 risk of complications and is within 5 day window of symptom onset so will start oral anti-viral medication with molnupiravir. Discussed with patient potential side effects. Offered albuterol inhaler, patient declined at this time. Discussed latest CDC isolation/quarantine guidelines. Recommend to continue with home supportive care. Monitor for worsening symptoms and if severe should go to the ED for evaluation. Pt verbalized understanding.      - As part of my medical decision making, I reviewed the following data within the [place] History obtained from pt /family, CMA notes reviewed and incorporated if applicable, Labs reviewed, Radiograph/ tests reviewed if applicable and OV notes from prior OV's with me, as well as  any other specialists she/he has seen since seeing me last, were all reviewed and used in my medical decision making process today.    - Additionally, when appropriate, discussion had with patient regarding our treatment plan, and their biases/concerns about that plan were used in my medical decision making today.    - The patient agreed with the plan and demonstrated an understanding of the instructions.   No barriers to understanding were identified.     - The patient was advised to call back or seek an  in-person evaluation if the symptoms worsen or if the condition fails to improve as anticipated.   Return if symptoms worsen or fail to improve.    No orders of the defined types were placed in this encounter.   Meds ordered this encounter  Medications   molnupiravir EUA (LAGEVRIO) 200 mg CAPS capsule    Sig: Take 4 capsules (800 mg total) by mouth 2 (two) times daily for 5 days.    Dispense:  40 capsule    Refill:  0    There are no discontinued medications.     Time spent on telephone encounter was 8 minutes.      The [place] was signed into law in 2016 which includes the topic of electronic health records.  This provides immediate access to information in MyChart.  This includes consultation notes, operative notes, office notes, lab results and pathology reports.  If you have any questions about what you read please let us know at your next visit or call us at the office.  We are right here with you.   __________________________________________________________________________________     Patient Care Team    Relationship Specialty Notifications Start End  Douglas Barnes, [place] PCP - General   09/28/19   Arta Silence, MD Consulting Physician Gastroenterology  02/23/16   Danella Sensing, MD Consulting Physician Dermatology  02/23/16   Festus Aloe, MD Consulting Physician Urology  02/23/16   Sharlet Salina, MD Referring Physician Physical Medicine and Rehabilitation  02/23/16    Comment: lower back/ transformainal injections  Alexis Frock, MD Consulting Physician Urology  03/13/18      -Vitals obtained; medications/ allergies reconciled;  personal medical, social, Sx etc.histories were updated by CMA, reviewed by me and are reflected in chart   Patient Active Problem List   Diagnosis Date Noted   Gallstones 07/26/2021   Episode of dizziness 03/14/2019   Numbness 03/14/2019   Anxiety 03/14/2019   On statin therapy 03/14/2019   Glucose  intolerance (impaired glucose tolerance) 03/13/2018   Localized swelling, mass or lump of neck 09/20/2017   GERD (gastroesophageal reflux disease) 05/31/2017   Elevated LDL cholesterol level 05/31/2017   Low HDL (under 40) 05/31/2017   Fatigue 05/31/2017   Vitamin D deficiency 05/31/2017   Noncompliance with diet and medication regimen 05/31/2017   Obese abdomen 05/30/2016   Poor diet 05/30/2016   Inactivity- not working out and no cardio 05/30/2016   HTN (hypertension) 02/23/2016   History of tobacco use 02/23/2016   Hepatitis C infection- s/p harvoni 02/23/2016   History of colonic polyps 02/23/2016   Hematuria- chronic (followed by Dr Junious Silk urology) 07/11/2015   Benign prostatic hyperplasia with lower urinary tract symptoms 07/09/2015   DDD (degenerative disc disease), lumbar 12/01/2013   Lumbar radiculitis 12/01/2013     Current Meds  Medication Sig   Acetaminophen 500 MG capsule Take by mouth.   ALPRAZolam (XANAX) 0.5 MG tablet Take 1-2 tablets by mouth 30  minutes before flying.   atorvastatin (LIPITOR) 80 MG tablet Take 0.5 tablets (40 mg total) by mouth daily.   finasteride (PROSCAR) 5 MG tablet Take 5 mg by mouth daily.   ibuprofen (ADVIL) 200 MG tablet 4-6 tablets   losartan-hydrochlorothiazide (HYZAAR) 100-25 MG tablet Take 1 tablet by mouth daily.   molnupiravir EUA (LAGEVRIO) 200 mg CAPS capsule Take 4 capsules (800 mg total) by mouth 2 (two) times daily for 5 days.   omeprazole (PRILOSEC) 20 MG capsule TAKE 1 CAPSULE BY MOUTH DAILY AT BEDTIME   sildenafil (VIAGRA) 100 MG tablet 1/2 TABLET EVERY DAY AS NEEDED     Allergies:  No Known Allergies   ROS:  See above HPI for pertinent positives and negatives   Objective:   Temperature 98.7 F (37.1 C), height 5\' 8"  (1.727 m), weight 172 lb (78 kg).  (if some vitals are omitted, this means that patient was UNABLE to obtain them. ) General: A & O * 3; sounds in no acute distress Respiratory: speaking in full  sentences, no conversational dyspnea Psych: insight appears good, mood- appears appropriate

## 2021-07-26 NOTE — Patient Instructions (Signed)
10 Things You Can Do to Manage Your COVID-19 Symptoms at Home ?If you have possible or confirmed COVID-19 ?Stay home except to get medical care. ?Monitor your symptoms carefully. If your symptoms get worse, call your healthcare provider immediately. ?Get rest and stay hydrated. ?If you have a medical appointment, call the healthcare provider ahead of time and tell them that you have or may have COVID-19. ?For medical emergencies, call 911 and notify the dispatch personnel that you have or may have COVID-19. ?Cover your cough and sneezes with a tissue or use the inside of your elbow. ?Wash your hands often with soap and water for at least 20 seconds or clean your hands with an alcohol-based hand sanitizer that contains at least 60% alcohol. ?As much as possible, stay in a specific room and away from other people in your home. Also, you should use a separate bathroom, if available. If you need to be around other people in or outside of the home, wear a mask. ?Avoid sharing personal items with other people in your household, like dishes, towels, and bedding. ?Clean all surfaces that are touched often, like counters, tabletops, and doorknobs. Use household cleaning sprays or wipes according to the label instructions. ?cdc.gov/coronavirus ?12/12/2019 ?This information is not intended to replace advice given to you by your health care provider. Make sure you discuss any questions you have with your health care provider. ?Document Revised: 02/04/2021 Document Reviewed: 02/04/2021 ?Elsevier Patient Education ? 2022 Elsevier Inc. ? ?

## 2021-08-16 NOTE — Progress Notes (Signed)
Complete physical exam   Patient: Douglas Barnes   DOB: 24-May-1956   66 y.o. Male  MRN: [id number] Visit Date: 08/17/2021   Chief Complaint  Patient presents with   Annual Exam   Subjective    Douglas Barnes is a 66 y.o. male who presents today for a complete physical exam.  He reports consuming a general diet. Gym/ health club routine includes walking several  miles at work. He generally feels fairly well. He does have additional problems to discuss today- reports passed out at work 2 weeks ago which has happened before and has been evaluated by cardiology and symptoms were attributed to panic attacks, no cardiac etiology. When incident happened did feel overwhelmed and stressed. Denies chest pain, shortness of breath, jaw or left arm pain.    Past Medical History:  Diagnosis Date   BPH (benign prostatic hypertrophy) with urinary retention    Complication of anesthesia    "fear of not waking up"   Foley catheter in place    Hepatitis C antibody test positive    dx 1997--  pt is getting treatment in near future (last enzymes 12 /2016 stable per pt and in epic)   History of colon polyps    History of hypertension    no medication since 2014 lost weight and decrease stress   Hypertension    Past Surgical History:  Procedure Laterality Date   ANAL FISSURE REPAIR     COLONOSCOPY WITH PROPOFOL  last one 2015   GREEN LIGHT LASER TURP (TRANSURETHRAL RESECTION OF PROSTATE N/A 07/09/2015   Procedure: GREEN LIGHT LASER TURP (TRANSURETHRAL RESECTION OF PROSTATE;  Surgeon: Jerilee Field, MD;  Location: [facility];  Service: Urology;  Laterality: N/A;   INGUINAL HERNIA REPAIR Right age 88   TRANSURETHRAL RESECTION OF PROSTATE N/A 07/09/2015   Procedure: CYSTOSCOPY WITH CLOT EVACUATION //FULGERATION OF BLEEDERS;  Surgeon: Sebastian Ache, MD;  Location: [facility];  Service: Urology;  Laterality: N/A;   Social History   Socioeconomic History   Marital status:  Married    Spouse name: Not on file   Number of children: 3   Years of education: Not on file   Highest education level: Not on file  Occupational History   Not on file  Tobacco Use   Smoking status: Former    Packs/day: 0.25    Years: 25.00    Pack years: 6.25    Types: Cigarettes    Quit date: 04/02/2014    Years since quitting: 7.3   Smokeless tobacco: Never  Vaping Use   Vaping Use: Never used  Substance and Sexual Activity   Alcohol use: No   Drug use: Yes    Types: Marijuana   Sexual activity: Yes    Birth control/protection: Condom  Other Topics Concern   Not on file  Social History Narrative   Not on file   Social Determinants of Health   Financial Resource Strain: Not on file  Food Insecurity: Not on file  Transportation Needs: Not on file  Physical Activity: Not on file  Stress: Not on file  Social Connections: Not on file  Intimate Partner Violence: Not on file     Medications: Outpatient Medications Prior to Visit  Medication Sig   Acetaminophen 500 MG capsule Take by mouth.   ALPRAZolam (XANAX) 0.5 MG tablet Take 1-2 tablets by mouth 30 minutes before flying.   finasteride (PROSCAR) 5 MG tablet Take 5 mg by mouth daily.  ibuprofen (ADVIL) 200 MG tablet 4-6 tablets   omeprazole (PRILOSEC) 20 MG capsule TAKE 1 CAPSULE BY MOUTH DAILY AT BEDTIME   sildenafil (VIAGRA) 100 MG tablet 1/2 TABLET EVERY DAY AS NEEDED   [DISCONTINUED] atorvastatin (LIPITOR) 80 MG tablet Take 0.5 tablets (40 mg total) by mouth daily.   [DISCONTINUED] losartan-hydrochlorothiazide (HYZAAR) 100-25 MG tablet Take 1 tablet by mouth daily.   No facility-administered medications prior to visit.    Review of Systems Review of Systems:  A fourteen system review of systems was performed and found to be positive as per HPI.   Last CBC Lab Results  Component Value Date   WBC 8.9 02/17/2021   HGB 14.9 02/17/2021   HCT 43.2 02/17/2021   MCV 90 02/17/2021   MCH 30.9 02/17/2021    RDW 12.2 02/17/2021   PLT 292 02/17/2021   Last metabolic panel Lab Results  Component Value Date   GLUCOSE 99 02/17/2021   NA 140 02/17/2021   K 3.5 02/17/2021   CL 99 02/17/2021   CO2 25 02/17/2021   BUN 13 02/17/2021   CREATININE 0.97 02/17/2021   EGFR 88 02/17/2021   CALCIUM 9.5 02/17/2021   PHOS 2.9 05/31/2017   PROT 7.5 02/17/2021   ALBUMIN 4.8 02/17/2021   LABGLOB 2.7 02/17/2021   AGRATIO 1.8 02/17/2021   BILITOT 0.7 02/17/2021   ALKPHOS 61 02/17/2021   AST 17 02/17/2021   ALT 15 02/17/2021   ANIONGAP 7 01/26/2016   Last lipids Lab Results  Component Value Date   CHOL 95 (L) 09/19/2019   HDL 32 (L) 09/19/2019   LDLCALC 44 09/19/2019   LDLDIRECT 53 02/17/2021   TRIG 101 09/19/2019   CHOLHDL 3.0 09/19/2019   Last hemoglobin A1c Lab Results  Component Value Date   HGBA1C 6.2 (H) 02/17/2021   Last thyroid functions Lab Results  Component Value Date   TSH 1.320 02/17/2021   T3TOTAL 149 04/08/2019   Last vitamin D Lab Results  Component Value Date   VD25OH 35.7 02/17/2021      Objective    BP 116/70   Pulse 72   Temp 97.6 F (36.4 C)   Ht 5\' 8"  (1.727 m)   Wt 175 lb (79.4 kg)   SpO2 98%   BMI 26.61 kg/m  BP Readings from Last 3 Encounters:  08/17/21 116/70  02/17/21 105/72  04/27/20 112/72   Wt Readings from Last 3 Encounters:  08/17/21 175 lb (79.4 kg)  07/26/21 172 lb (78 kg)  02/17/21 180 lb 12.8 oz (82 kg)      Physical Exam   General Appearance:       Alert, cooperative, in no acute distress, appears stated age  Head:    Normocephalic, without obvious abnormality, atraumatic  Eyes:    PERRL, conjunctiva/corneas clear, EOM's intact, fundi    benign, both eyes       Ears:    Normal TM's and external ear canals, both ears  Nose:   Nares normal, septum midline, mucosa normal, no drainage   or sinus tenderness  Throat:   Lips, mucosa, and tongue normal; teeth and gums normal  Neck:   Supple, symmetrical, trachea midline, no  adenopathy;       thyroid:  No enlargement/tenderness/nodules; no carotid   bruit or JVD  Back:     Symmetric, no curvature, ROM normal, no CVA tenderness  Lungs:     Clear to auscultation bilaterally, respirations unlabored  Chest wall:    No tenderness  or deformity  Heart:    Normal heart rate. Normal rhythm. No murmurs, rubs, or gallops.  S1 and S2 normal  Abdomen:     Soft, non-tender, bowel sounds active all four quadrants,    no masses, no organomegaly  Genitalia:    deferred  Rectal:    deferred  Extremities:   All extremities are intact. No cyanosis or edema  Pulses:   2+ and symmetric all extremities  Skin:   Skin color, texture, turgor normal, no rashes or lesions  Lymph nodes:   Cervical, supraclavicular, and axillary nodes normal  Neurologic:   CNII-XII intact. Normal strength, sensation and reflexes      throughout     Last depression screening scores    08/17/2021    9:53 AM 07/26/2021    7:58 AM 02/17/2021    2:55 PM  PHQ 2/9 Scores  PHQ - 2 Score 0 0 1  PHQ- 9 Score 6  4   Last fall risk screening    08/17/2021    9:29 AM  Fall Risk   Falls in the past year? 0  Number falls in past yr: 0  Injury with Fall? 0  Risk for fall due to : No Fall Risks  Follow up Falls evaluation completed     No results found for any visits on 08/17/21.  Assessment & Plan    Routine Health Maintenance and Physical Exam  Exercise Activities and Dietary recommendations -Discussed heart healthy diet low in fat and carbohydrates.  Immunization History  Administered Date(s) Administered   Hep A / Hep B 11/04/2015, 12/03/2015, 05/11/2016   Influenza Inj Mdck Quad Pf 03/25/2017   Influenza,inj,Quad PF,6+ Mos 02/23/2016, 03/13/2018, 02/17/2021   Influenza-Unspecified 02/14/2019   PFIZER(Purple Top)SARS-COV-2 Vaccination 08/08/2019, 08/29/2019   Tdap 02/23/2016    Health Maintenance  Topic Date Due   HIV Screening  Never done   Zoster Vaccines- Shingrix (1 of 2) Never done    COVID-19 Vaccine (3 - Pfizer risk series) 09/26/2019   COLONOSCOPY (Pts 45-54yrs Insurance coverage will need to be confirmed)  11/11/2023   TETANUS/TDAP  02/22/2026   INFLUENZA VACCINE  Completed   Hepatitis C Screening  Completed   HPV VACCINES  Aged Out    Discussed health benefits of physical activity, and encouraged him to engage in regular exercise appropriate for his age and condition.  Problem List Items Addressed This Visit       Endocrine   Glucose intolerance (impaired glucose tolerance)   Relevant Orders   Comp Met (CMET)   CBC w/Diff   Lipid Profile   HgB A1c     Other   Elevated LDL cholesterol level   Relevant Medications   atorvastatin (LIPITOR) 80 MG tablet   Other Relevant Orders   Comp Met (CMET)   CBC w/Diff   Lipid Profile   HgB A1c   Low HDL (under 40)   Relevant Medications   atorvastatin (LIPITOR) 80 MG tablet   Other Relevant Orders   Lipid Profile   Other Visit Diagnoses     Healthcare maintenance    -  Primary   Essential hypertension       Relevant Medications   losartan-hydrochlorothiazide (HYZAAR) 100-25 MG tablet   atorvastatin (LIPITOR) 80 MG tablet   Other Relevant Orders   Comp Met (CMET)   CBC w/Diff   Psychogenic syncope          UTD colonoscopy. Declined Shingrix/HIV screening. Will obtain routine fasting labs. Discussed with  patient additional concerns not covered with annual physical and can be billed for 2 visits to do further work-up including EKG. Pt declined. Discussed s/sx suggestive of psychogenic syncope. Discussed red flag s/sx to monitor for and should seek immediate medical care. Pt verbalized understanding. Recommend to consider referral to [facility] therapy. Will let me know if decides to pursue referral. Pt no interested on medication therapy. BP and pulse wnl's.   Return in about 4 months (around 12/17/2021) for HTN, HLD, prediabetes.       Mayer Masker, PA-C  [facility] [phone] (phone) [phone] (fax)  [facility]

## 2021-08-17 ENCOUNTER — Other Ambulatory Visit: Payer: Self-pay

## 2021-08-17 ENCOUNTER — Ambulatory Visit (INDEPENDENT_AMBULATORY_CARE_PROVIDER_SITE_OTHER): Payer: 59 | Admitting: Physician Assistant

## 2021-08-17 ENCOUNTER — Encounter: Payer: Self-pay | Admitting: Physician Assistant

## 2021-08-17 VITALS — BP 116/70 | HR 72 | Temp 97.6°F | Ht 68.0 in | Wt 175.0 lb

## 2021-08-17 DIAGNOSIS — E7800 Pure hypercholesterolemia, unspecified: Secondary | ICD-10-CM | POA: Diagnosis not present

## 2021-08-17 DIAGNOSIS — E786 Lipoprotein deficiency: Secondary | ICD-10-CM

## 2021-08-17 DIAGNOSIS — Z0000 Encounter for general adult medical examination without abnormal findings: Secondary | ICD-10-CM | POA: Diagnosis not present

## 2021-08-17 DIAGNOSIS — R7302 Impaired glucose tolerance (oral): Secondary | ICD-10-CM

## 2021-08-17 DIAGNOSIS — I10 Essential (primary) hypertension: Secondary | ICD-10-CM | POA: Diagnosis not present

## 2021-08-17 DIAGNOSIS — F488 Other specified nonpsychotic mental disorders: Secondary | ICD-10-CM

## 2021-08-17 MED ORDER — LOSARTAN POTASSIUM-HCTZ 100-25 MG PO TABS: 1.0000 | ORAL_TABLET | Freq: Every day | ORAL | 1 refills | Status: DC

## 2021-08-17 MED ORDER — ATORVASTATIN CALCIUM 80 MG PO TABS: 40.0000 mg | ORAL_TABLET | Freq: Every day | ORAL | 1 refills | Status: DC

## 2021-08-17 NOTE — Patient Instructions (Signed)
?Preventive Care 54-64 Years Old, Male ?Preventive care refers to lifestyle choices and visits with your health care provider that can promote health and wellness. Preventive care visits are also called wellness exams. ?What can I expect for my preventive care visit? ?Counseling ?During your preventive care visit, your health care provider may ask about your: ?Medical history, including: ?Past medical problems. ?Family medical history. ?Current health, including: ?Emotional well-being. ?Home life and relationship well-being. ?Sexual activity. ?Lifestyle, including: ?Alcohol, nicotine or tobacco, and drug use. ?Access to firearms. ?Diet, exercise, and sleep habits. ?Safety issues such as seatbelt and bike helmet use. ?Sunscreen use. ?Work and work Statistician. ?Physical exam ?Your health care provider will check your: ?Height and weight. These may be used to calculate your BMI (body mass index). BMI is a measurement that tells if you are at a healthy weight. ?Waist circumference. This measures the distance around your waistline. This measurement also tells if you are at a healthy weight and may help predict your risk of certain diseases, such as type 2 diabetes and high blood pressure. ?Heart rate and blood pressure. ?Body temperature. ?Skin for abnormal spots. ?What immunizations do I need? ?Vaccines are usually given at various ages, according to a schedule. Your health care provider will recommend vaccines for you based on your age, medical history, and lifestyle or other factors, such as travel or where you work. ?What tests do I need? ?Screening ?Your health care provider may recommend screening tests for certain conditions. This may include: ?Lipid and cholesterol levels. ?Diabetes screening. This is done by checking your blood sugar (glucose) after you have not eaten for a while (fasting). ?Hepatitis B test. ?Hepatitis C test. ?HIV (human immunodeficiency virus) test. ?STI (sexually transmitted infection)  testing, if you are at risk. ?Lung cancer screening. ?Prostate cancer screening. ?Colorectal cancer screening. ?Talk with your health care provider about your test results, treatment options, and if necessary, the need for more tests. ?Follow these instructions at home: ?Eating and drinking ? ?Eat a diet that includes fresh fruits and vegetables, whole grains, lean protein, and low-fat dairy products. ?Take vitamin and mineral supplements as recommended by your health care provider. ?Do not drink alcohol if your health care provider tells you not to drink. ?If you drink alcohol: ?Limit how much you have to 0-2 drinks a day. ?Know how much alcohol is in your drink. In the [place] one drink equals one 12 oz bottle of beer (355 mL), one 5 oz glass of wine (148 mL), or one 1? oz glass of hard liquor (44 mL). ?Lifestyle ?Brush your teeth every morning and night with fluoride toothpaste. Floss one time each day. ?Exercise for at least 30 minutes 5 or more days each week. ?Do not use any products that contain nicotine or tobacco. These products include cigarettes, chewing tobacco, and vaping devices, such as e-cigarettes. If you need help quitting, ask your health care provider. ?Do not use drugs. ?If you are sexually active, practice safe sex. Use a condom or other form of protection to prevent STIs. ?Take aspirin only as told by your health care provider. Make sure that you understand how much to take and what form to take. Work with your health care provider to find out whether it is safe and beneficial for you to take aspirin daily. ?Find healthy ways to manage stress, such as: ?Meditation, yoga, or listening to music. ?Journaling. ?Talking to a trusted person. ?Spending time with friends and family. ?Minimize exposure to UV radiation to reduce your  risk of skin cancer. ?Safety ?Always wear your seat belt while driving or riding in a vehicle. ?Do not drive: ?If you have been drinking alcohol. Do not ride with someone who  has been drinking. ?When you are tired or distracted. ?While texting. ?If you have been using any mind-altering substances or drugs. ?Wear a helmet and other protective equipment during sports activities. ?If you have firearms in your house, make sure you follow all gun safety procedures. ?What's next? ?Go to your health care provider once a year for an annual wellness visit. ?Ask your health care provider how often you should have your eyes and teeth checked. ?Stay up to date on all vaccines. ?This information is not intended to replace advice given to you by your health care provider. Make sure you discuss any questions you have with your health care provider. ?Document Revised: 11/10/2020 Document Reviewed: 11/10/2020 ?Elsevier Patient Education ? [place]. ?Syncope, Adult ?Syncope refers to a condition in which a person temporarily loses consciousness. Syncope may also be called fainting or passing out. It is caused by a sudden decrease in blood flow to the brain. This can happen for a variety of reasons. ?Most causes of syncope are not dangerous. It can be triggered by things such as needle sticks, seeing blood, pain, or intense emotion. However, syncope can also be a sign of a serious medical problem, such as a heart abnormality. Other causes can include dehydration, migraines, or taking medicines that lower blood pressure. Your health care provider may do tests to find the reason why you are having syncope. ?If you faint, get medical help right away. Call your local emergency services (911 in the [place] ?Follow these instructions at home: ?Pay attention to any changes in your symptoms. Take these actions to stay safe and to help relieve your symptoms: ?Knowing when you may be about to faint ?Signs that you may be about to faint include: ?Feeling dizzy, weak, light-headed, or like the room is spinning. ?Feeling nauseous. ?Seeing spots or seeing all white or all black in your field of vision. ?Having  cold, clammy skin or feeling warm and sweaty. ?Hearing ringing in the ears (tinnitus). ?If you start to feel like you might faint, sit or lie down right away. If sitting, put your head down between your legs. If lying down, raise (elevate) your feet above the level of your heart. ?Breathe deeply and steadily. Wait until all the symptoms have passed. ?Have someone stay with you until you feel stable. ?Medicines ?Take over-the-counter and prescription medicines only as told by your health care provider. ?If you are taking blood pressure or heart medicine, get up slowly and take several minutes to sit and then stand. This can reduce dizziness and decrease the risk of syncope. ?Lifestyle ?Do not drive, use machinery, or play sports until your health care provider says it is okay. ?Do not drink alcohol. ?Do not use any products that contain nicotine or tobacco. These products include cigarettes, chewing tobacco, and vaping devices, such as e-cigarettes. If you need help quitting, ask your health care provider. ?Avoid hot tubs and saunas. ?General instructions ?Talk with your health care provider about your symptoms. You may need to have testing to understand the cause of your syncope. ?Drink enough fluid to keep your urine pale yellow. ?Avoid prolonged standing. If you must stand for a long time, do movements such as: ?Moving your legs. ?Crossing your legs. ?Flexing and stretching your leg muscles. ?Squatting. ?Keep all follow-up visits. This  is important. ?Contact a health care provider if: ?You have episodes of near fainting. ?Get help right away if: ?You faint. ?You hit your head or are injured after fainting. ?You have any of these symptoms that may indicate trouble with your heart: ?Fast or irregular heartbeats (palpitations). ?Unusual pain in your chest, abdomen, or back. ?Shortness of breath. ?You have a seizure. ?You have a severe headache. ?You are confused. ?You have vision problems. ?You have severe weakness  or trouble walking. ?You are bleeding from your mouth or rectum, or you have black or tarry stool. ?These symptoms may represent a serious problem that is an emergency. Do not wait to see if your symptoms

## 2021-08-18 LAB — CBC WITH DIFFERENTIAL/PLATELET
Basophils Absolute: 0 10*3/uL (ref 0.0–0.2)
Basos: 1 %
EOS (ABSOLUTE): 0.3 10*3/uL (ref 0.0–0.4)
Eos: 3 %
Hematocrit: 43.5 % (ref 37.5–51.0)
Hemoglobin: 14.7 g/dL (ref 13.0–17.7)
Immature Grans (Abs): 0 10*3/uL (ref 0.0–0.1)
Immature Granulocytes: 0 %
Lymphocytes Absolute: 1.6 10*3/uL (ref 0.7–3.1)
Lymphs: 21 %
MCH: 30.9 pg (ref 26.6–33.0)
MCHC: 33.8 g/dL (ref 31.5–35.7)
MCV: 91 fL (ref 79–97)
Monocytes Absolute: 0.7 10*3/uL (ref 0.1–0.9)
Monocytes: 9 %
Neutrophils Absolute: 5.3 10*3/uL (ref 1.4–7.0)
Neutrophils: 66 %
Platelets: 288 10*3/uL (ref 150–450)
RBC: 4.76 x10E6/uL (ref 4.14–5.80)
RDW: 12.6 % (ref 11.6–15.4)
WBC: 7.9 10*3/uL (ref 3.4–10.8)

## 2021-08-18 LAB — COMPREHENSIVE METABOLIC PANEL
ALT: 13 IU/L (ref 0–44)
AST: 14 IU/L (ref 0–40)
Albumin/Globulin Ratio: 1.6 (ref 1.2–2.2)
Albumin: 4.5 g/dL (ref 3.8–4.8)
Alkaline Phosphatase: 65 IU/L (ref 44–121)
BUN/Creatinine Ratio: 17 (ref 10–24)
BUN: 16 mg/dL (ref 8–27)
Bilirubin Total: 1 mg/dL (ref 0.0–1.2)
CO2: 26 mmol/L (ref 20–29)
Calcium: 9.4 mg/dL (ref 8.6–10.2)
Chloride: 101 mmol/L (ref 96–106)
Creatinine, Ser: 0.96 mg/dL (ref 0.76–1.27)
Globulin, Total: 2.8 g/dL (ref 1.5–4.5)
Glucose: 119 mg/dL — ABNORMAL HIGH (ref 70–99)
Potassium: 4.3 mmol/L (ref 3.5–5.2)
Sodium: 142 mmol/L (ref 134–144)
Total Protein: 7.3 g/dL (ref 6.0–8.5)
eGFR: 88 mL/min/{1.73_m2} (ref >59)

## 2021-08-18 LAB — LIPID PANEL
Chol/HDL Ratio: 2.6 ratio (ref 0.0–5.0)
Cholesterol, Total: 111 mg/dL (ref 100–199)
HDL: 42 mg/dL (ref >39)
LDL Chol Calc (NIH): 52 mg/dL (ref 0–99)
Triglycerides: 83 mg/dL (ref 0–149)
VLDL Cholesterol Cal: 17 mg/dL (ref 5–40)

## 2021-08-18 LAB — HEMOGLOBIN A1C
Est. average glucose Bld gHb Est-mCnc: 134 mg/dL
Hgb A1c MFr Bld: 6.3 % — ABNORMAL HIGH (ref 4.8–5.6)

## 2021-12-11 ENCOUNTER — Other Ambulatory Visit: Payer: Self-pay | Admitting: Physician Assistant

## 2021-12-11 DIAGNOSIS — E7800 Pure hypercholesterolemia, unspecified: Secondary | ICD-10-CM

## 2021-12-11 DIAGNOSIS — E786 Lipoprotein deficiency: Secondary | ICD-10-CM

## 2022-01-14 ENCOUNTER — Emergency Department
Admission: EM | Admit: 2022-01-14 | Discharge: 2022-01-14 | Discharge status: Against Medical Advice | Payer: No Typology Code available for payment source | Attending: Emergency Medicine | Admitting: Emergency Medicine

## 2022-01-14 ENCOUNTER — Emergency Department: Payer: No Typology Code available for payment source

## 2022-01-14 ENCOUNTER — Emergency Department: Payer: 59

## 2022-01-14 ENCOUNTER — Other Ambulatory Visit: Payer: Self-pay

## 2022-01-14 DIAGNOSIS — R569 Unspecified convulsions: Secondary | ICD-10-CM | POA: Insufficient documentation

## 2022-01-14 DIAGNOSIS — R55 Syncope and collapse: Secondary | ICD-10-CM | POA: Diagnosis present

## 2022-01-14 DIAGNOSIS — I10 Essential (primary) hypertension: Secondary | ICD-10-CM | POA: Insufficient documentation

## 2022-01-14 DIAGNOSIS — Y990 Civilian activity done for income or pay: Secondary | ICD-10-CM | POA: Diagnosis not present

## 2022-01-14 LAB — COMPREHENSIVE METABOLIC PANEL
ALT: 14 U/L (ref 0–44)
AST: 28 U/L (ref 15–41)
Albumin: 3.8 g/dL (ref 3.5–5.0)
Alkaline Phosphatase: 49 U/L (ref 38–126)
Anion gap: 6 (ref 5–15)
BUN: 25 mg/dL — ABNORMAL HIGH (ref 8–23)
CO2: 26 mmol/L (ref 22–32)
Calcium: 8.4 mg/dL — ABNORMAL LOW (ref 8.9–10.3)
Chloride: 108 mmol/L (ref 98–111)
Creatinine, Ser: 1.16 mg/dL (ref 0.61–1.24)
GFR, Estimated: >60 mL/min (ref >60)
Glucose, Bld: 153 mg/dL — ABNORMAL HIGH (ref 70–99)
Potassium: 3.2 mmol/L — ABNORMAL LOW (ref 3.5–5.1)
Sodium: 140 mmol/L (ref 135–145)
Total Bilirubin: 0.9 mg/dL (ref 0.3–1.2)
Total Protein: 6.7 g/dL (ref 6.5–8.1)

## 2022-01-14 LAB — CBC WITH DIFFERENTIAL/PLATELET
Abs Immature Granulocytes: 0.02 10*3/uL (ref 0.00–0.07)
Basophils Absolute: 0.1 10*3/uL (ref 0.0–0.1)
Basophils Relative: 1 %
Eosinophils Absolute: 0.3 10*3/uL (ref 0.0–0.5)
Eosinophils Relative: 4 %
HCT: 34.6 % — ABNORMAL LOW (ref 39.0–52.0)
Hemoglobin: 11.8 g/dL — ABNORMAL LOW (ref 13.0–17.0)
Immature Granulocytes: 0 %
Lymphocytes Relative: 34 %
Lymphs Abs: 3.2 10*3/uL (ref 0.7–4.0)
MCH: 31.4 pg (ref 26.0–34.0)
MCHC: 34.1 g/dL (ref 30.0–36.0)
MCV: 92 fL (ref 80.0–100.0)
Monocytes Absolute: 0.8 10*3/uL (ref 0.1–1.0)
Monocytes Relative: 8 %
Neutro Abs: 5.1 10*3/uL (ref 1.7–7.7)
Neutrophils Relative %: 53 %
Platelets: 239 10*3/uL (ref 150–400)
RBC: 3.76 MIL/uL — ABNORMAL LOW (ref 4.22–5.81)
RDW: 12.5 % (ref 11.5–15.5)
WBC: 9.5 10*3/uL (ref 4.0–10.5)
nRBC: 0 % (ref 0.0–0.2)

## 2022-01-14 LAB — URINE DRUG SCREEN, QUALITATIVE (ARMC ONLY)
Amphetamines, Ur Screen: NOT DETECTED
Barbiturates, Ur Screen: NOT DETECTED
Benzodiazepine, Ur Scrn: POSITIVE — AB
Cannabinoid 50 Ng, Ur ~~LOC~~: POSITIVE — AB
Cocaine Metabolite,Ur ~~LOC~~: NOT DETECTED
MDMA (Ecstasy)Ur Screen: NOT DETECTED
Methadone Scn, Ur: NOT DETECTED
Opiate, Ur Screen: NOT DETECTED
Phencyclidine (PCP) Ur S: NOT DETECTED
Tricyclic, Ur Screen: NOT DETECTED

## 2022-01-14 LAB — TROPONIN I (HIGH SENSITIVITY)
Troponin I (High Sensitivity): 3 ng/L (ref <18)
Troponin I (High Sensitivity): 3 ng/L (ref <18)

## 2022-01-14 LAB — ETHANOL: Alcohol, Ethyl (B): <10 mg/dL (ref <10)

## 2022-01-14 MED ORDER — LORAZEPAM 2 MG/ML IJ SOLN
1.0000 mg | Freq: Once | INTRAMUSCULAR | Status: AC
  Administered 2022-01-14: 1 mg via INTRAVENOUS
  Filled 2022-01-14: qty 1

## 2022-01-14 MED ORDER — POTASSIUM CHLORIDE CRYS ER 20 MEQ PO TBCR
40.0000 meq | EXTENDED_RELEASE_TABLET | Freq: Once | ORAL | Status: AC
  Administered 2022-01-14: 40 meq via ORAL
  Filled 2022-01-14: qty 2

## 2022-01-14 NOTE — ED Triage Notes (Signed)
Pt from work [employer] with reports of syncope. PT was found in a bush while at work. PT reports that he was walking and fell over into a bush. Pt was given narcan en route with no change in mental status. Pt is alert and oriented x 4 at this time.

## 2022-01-14 NOTE — ED Provider Notes (Signed)
Patient received in signout from Dr. Charna Archer.  Patient had initially planned to leave AMA then with commands but family to stay for MRI.  We attempted to get MRI with and without as was recommended by neurology but patient was unable to tolerate due to claustrophobia despite multiple doses of anxiolysis.  Did observed in the ER.  His troponins were negative.  Based on uncertain etiology of his symptoms with age and risk factors I recommended strongly admission to the hospital for further work-up of syncope evaluation.  Patient was unwilling to be admitted or stay overnight decided to leave [place].  He demonstrated understanding of the risks associated with leaving at this time and the reasons for my recommendation for admission.  This conversation was witnessed by family.   Merlyn Lot, MD 01/14/22 2204

## 2022-01-14 NOTE — ED Provider Notes (Signed)
[facility] Provider Note    Event Date/Time   First MD Initiated Contact with Patient 01/14/22 1442     (approximate)   History   Chief Complaint Loss of Consciousness   HPI  Douglas Barnes is a 66 y.o. male with past medical history of hypertension, hyperlipidemia, and GERD who presents to the ED for loss of consciousness.  Per EMS, patient was found unconscious in a bush near his workplace.  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  On EMS arrival, patient seemed to wake up, but he had multiple episodes where he stopped responding for 30 to 45 seconds at a time.  EMS states that patient would stare straight ahead and not respond to painful stimuli, did not have any stiffness or shaking.  EMS estimates 5 of these episodes during transport.  Just after arrival to the ED, patient wakes up from 1 of these episodes and immediately asks "where am I?"  He denies any fevers, cough, chest pain, shortness of breath, abdominal pain, nausea, or vomiting.  He denies any history of seizures or similar episodes of passing out, denies any alcohol or drug use.  He denies any vision changes, speech changes, numbness or weakness in his extremities.  He was given IV Narcan by EMS with no change in mental status during 1 of these episodes.     Physical Exam   Triage Vital Signs: ED Triage Vitals  Enc Vitals Group     BP      Pulse      Resp      Temp      Temp src      SpO2      Weight      Height      Head Circumference      Peak Flow      Pain Score      Pain Loc      Pain Edu?      Excl. in [place]?     Most recent vital signs: Vitals:   01/14/22 1440  BP: 115/79  Pulse: 98  Resp: 14  Temp: 98 F (36.7 C)  SpO2: 100%    Constitutional: Alert and oriented to person, place, time, and situation. Eyes: Conjunctivae are normal.  Pupils equal, round, and reactive to light  bilaterally. Head: Atraumatic. Nose: No congestion/rhinnorhea. Mouth/Throat: Mucous membranes are moist.  Neck: No midline cervical spine tenderness to palpation. Cardiovascular: Normal rate, regular rhythm. Grossly normal heart sounds.  2+ radial pulses bilaterally. Respiratory: Normal respiratory effort.  No retractions. Lungs CTAB. Gastrointestinal: Soft and nontender. No distention. Musculoskeletal: No lower extremity tenderness nor edema.  Neurologic:  Normal speech and language. No gross focal neurologic deficits are appreciated.    ED Results / Procedures / Treatments   Labs (all labs ordered are listed, but only abnormal results are displayed) Labs Reviewed  CBC WITH DIFFERENTIAL/PLATELET - Abnormal; Notable for the following components:      Result Value   RBC 3.76 (*)    Hemoglobin 11.8 (*)    HCT 34.6 (*)    All other components within normal limits  COMPREHENSIVE METABOLIC PANEL - Abnormal; Notable for the following components:   Potassium 3.2 (*)    Glucose, Bld 153 (*)    BUN 25 (*)    Calcium 8.4 (*)    All other components within normal limits  ETHANOL  URINE DRUG SCREEN, QUALITATIVE ([facility] ONLY)  TROPONIN I (HIGH SENSITIVITY)     EKG  ED ECG REPORT I, Blake Divine, the attending physician, personally viewed and interpreted this ECG.   Date: 01/14/2022  EKG Time:   Rate: 14:40  Rhythm: normal sinus rhythm  Axis: Normal  Intervals:none  ST&T Change: None  RADIOLOGY CT head reviewed and interpreted by me with no hemorrhage or midline shift.  CT cervical spine reviewed and interpreted by me with no fracture or dislocation.  PROCEDURES:  Critical Care performed: No  Procedures   MEDICATIONS ORDERED IN ED: Medications  potassium chloride SA (KLOR-CON M) CR tablet 40 mEq (has no administration in time range)  LORazepam (ATIVAN) injection 1 mg (1 mg Intravenous Given 01/14/22 1515)     IMPRESSION / MDM / ASSESSMENT AND PLAN / ED COURSE  I  reviewed the triage vital signs and the nursing notes.                              66 y.o. male with past medical history of hypertension, hyperlipidemia, and GERD who presents to the ED for episodes of decreased responsiveness where he stares straight ahead after being found unconscious in a bush.  Patient's presentation is most consistent with acute presentation with potential threat to life or bodily function.  Differential diagnosis includes, but is not limited to, syncope, seizure, arrhythmia, ACS, electrolyte abnormality, AKI, pseudoseizure.  Patient nontoxic-appearing and in no acute distress, vital signs are unremarkable.  Just after arrival with EMS he woke up from 1 of these episodes, states he does not remember what happened but is oriented with nonfocal neurologic exam.  He then seem to have another 1 of these episodes but immediately woke up following sternal rub, subsequently able to answer questions appropriately.  No vital sign abnormalities noted during these episodes, heart rate remained stable and patient breathing without difficulty during episode.  CT head and cervical spine are negative for acute process, EKG shows no evidence of arrhythmia or ischemia.  Labs are reassuring with no significant anemia, leukocytosis, or AKI, we will replete mild hypokalemia.  Patient was evaluated by Dr. Quinn Axe of neurology and episodes thought less likely to represent seizure.  Patient was offered MRI and admission for further monitoring for potential syncope versus seizure, but he declines both.  He expresses understanding that we cannot exclude seizure or cardiac etiology for his episodes we will further observe here in the ED and screen troponin, but afterwards patient is requesting to be discharged home.  He was counseled that he cannot drive for 6 months and will be provided with neurology follow-up.  Patient turned over to oncoming provider pending troponin and further observation.       FINAL CLINICAL IMPRESSION(S) / ED DIAGNOSES   Final diagnoses:  Syncope and collapse  Seizure-like activity ([place])     Rx / DC Orders   ED Discharge Orders     None        Note:  This document was prepared using Dragon voice recognition software and may include unintentional dictation errors.   Blake Divine, MD 01/14/22 [phone]

## 2022-01-17 ENCOUNTER — Encounter: Payer: Self-pay | Admitting: Physician Assistant

## 2022-01-17 ENCOUNTER — Ambulatory Visit (INDEPENDENT_AMBULATORY_CARE_PROVIDER_SITE_OTHER): Payer: 59 | Admitting: Physician Assistant

## 2022-01-17 VITALS — BP 99/66 | HR 79 | Temp 97.7°F | Ht 67.0 in | Wt 164.0 lb

## 2022-01-17 DIAGNOSIS — R55 Syncope and collapse: Secondary | ICD-10-CM

## 2022-01-17 NOTE — Patient Instructions (Signed)
Syncope, Adult  Syncope is when you pass out or faint for a short time. It is caused by a sudden decrease in blood flow to the brain. This can happen for many reasons. It can sometimes happen when seeing blood, getting a shot (injection), or having pain or strong emotions. Most causes of fainting are not dangerous, but in some cases it can be a sign of a serious medical problem. If you faint, get help right away. Call your local emergency services (911 in the [place] Follow these instructions at home: Watch for any changes in your symptoms. Take these actions to stay safe and help with your symptoms: Knowing when you may be about to faint Signs that you may be about to faint include: Feeling dizzy or light-headed. It may feel like the room is spinning. Feeling weak. Feeling like you may vomit (nauseous). Seeing spots or seeing all white or all black. Having cold, clammy skin. Feeling warm and sweaty. Hearing ringing in the ears. If you start to feel like you might faint, sit or lie down right away. If sitting, lower your head down between your legs. If lying down, raise (elevate) your feet above the level of your heart. Breathe deeply and steadily. Wait until all of the symptoms are gone. Have someone stay with you until you feel better. Medicines Take over-the-counter and prescription medicines only as told by your doctor. If you are taking blood pressure or heart medicine, sit up and stand up slowly. Spend a few minutes getting ready to sit and then stand. This can help you feel less dizzy. Lifestyle Do not drive, use machinery, or play sports until your doctor says it is okay. Do not drink alcohol. Do not smoke or use any products that contain nicotine or tobacco. If you need help quitting, ask your doctor. Avoid hot tubs and saunas. General instructions Talk with your doctor about your symptoms. You may need to have testing to help find the cause. Drink enough fluid to keep your pee  (urine) pale yellow. Avoid standing for a long time. If you must stand for a long time, do movements such as: Moving your legs. Crossing your legs. Flexing and stretching your leg muscles. Squatting. Keep all follow-up visits. Contact a doctor if: You have episodes of near fainting. Get help right away if: You pass out or faint. You hit your head or are injured after fainting. You have any of these symptoms: Fast or uneven heartbeats (palpitations). Pain in your chest, belly, or back. Shortness of breath. You have jerky movements that you cannot control (seizure). You have a very bad headache. You are confused. You have problems with how you see (vision). You are very weak. You have trouble walking. You are bleeding from your mouth or your butt (rectum). You have black or tarry poop (stool). These symptoms may be an emergency. Get help right away. Call your local emergency services (911 in the [place] Do not wait to see if the symptoms will go away. Do not drive yourself to the hospital. Summary Syncope is when you pass out or faint for a short time. It is caused by a sudden decrease in blood flow to the brain. Signs that you may be about to faint include feeling dizzy or light-headed, feeling like you may vomit, seeing all white or all black, or having cold, clammy skin. If you start to feel like you might faint, sit or lie down right away. Lower your head if sitting, or raise (elevate)   your feet if lying down. Breathe deeply and steadily. Wait until all of the symptoms are gone. This information is not intended to replace advice given to you by your health care provider. Make sure you discuss any questions you have with your health care provider. Document Revised: 09/23/2020 Document Reviewed: 09/23/2020 Elsevier Patient Education  2023 Elsevier Inc.  

## 2022-01-17 NOTE — Progress Notes (Signed)
Established patient visit   Patient: Douglas Barnes   DOB: [birth year]   66 y.o. Male  MRN: [id number] Visit Date: 01/17/2022  Chief Complaint  Patient presents with   Loss of Consciousness   Subjective    HPI  Patient presents for ED follow-up. Patient reports no new episodes of passing out or altered mental status. Patient reports the day he was found unconscious he had been up and down going from one building to the next. 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 States is constantly on his feet and that day did not have much to drink or eat. Denies chest pain, palpitations, shortness of breath or slurred speech. Does report intermittent brief episodes of dizziness.    ED HPI: Douglas Barnes is a 66 y.o. male with past medical history of hypertension, hyperlipidemia, and GERD who presents to the ED for loss of consciousness.  Per EMS, patient was found unconscious in a bush near his workplace.  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  On EMS arrival, patient seemed to wake up, but he had multiple episodes where he stopped responding for 30 to 45 seconds at a time.  EMS states that patient would stare straight ahead and not respond to painful stimuli, did not have any stiffness or shaking.  EMS estimates 5 of these episodes during transport.  Just after arrival to the ED, patient wakes up from 1 of these episodes and immediately asks "where am I?"  He denies any fevers, cough, chest pain, shortness of breath, abdominal pain, nausea, or vomiting.  He denies any history of seizures or similar episodes of passing out, denies any alcohol or drug use.  He denies any vision changes, speech changes, numbness or weakness in his extremities.  He was given IV Narcan by EMS with no change in mental status during 1 of these episodes.   Medications: Outpatient Medications Prior to  Visit  Medication Sig   Acetaminophen 500 MG capsule Take by mouth.   ALPRAZolam (XANAX) 0.5 MG tablet Take 1-2 tablets by mouth 30 minutes before flying.   atorvastatin (LIPITOR) 80 MG tablet TAKE ONE-HALF TABLET BY MOUTH ONE TIME DAILY   finasteride (PROSCAR) 5 MG tablet Take 5 mg by mouth daily.   ibuprofen (ADVIL) 200 MG tablet 4-6 tablets   losartan-hydrochlorothiazide (HYZAAR) 100-25 MG tablet Take 1 tablet by mouth daily.   omeprazole (PRILOSEC) 20 MG capsule TAKE 1 CAPSULE BY MOUTH DAILY AT BEDTIME   sildenafil (VIAGRA) 100 MG tablet 1/2 TABLET EVERY DAY AS NEEDED   No facility-administered medications prior to visit.    Review of Systems Review of Systems:  A fourteen system review of systems was performed and found to be positive as per HPI.  Last CBC Lab Results  Component Value Date   WBC 9.5 01/14/2022   HGB 11.8 (L) 01/14/2022   HCT 34.6 (L) 01/14/2022   MCV 92.0 01/14/2022   MCH 31.4 01/14/2022   RDW 12.5 01/14/2022   PLT 239 59/74/1638   Last metabolic panel Lab Results  Component Value Date   GLUCOSE 153 (H) 01/14/2022   NA 140 01/14/2022   K 3.2 (L) 01/14/2022   CL 108 01/14/2022   CO2 26 01/14/2022   BUN 25 (H) 01/14/2022   CREATININE 1.16 01/14/2022   GFRNONAA >60 01/14/2022   CALCIUM 8.4 (L) 01/14/2022   PHOS 2.9 05/31/2017  PROT 6.7 01/14/2022   ALBUMIN 3.8 01/14/2022   LABGLOB 2.8 08/17/2021   AGRATIO 1.6 08/17/2021   BILITOT 0.9 01/14/2022   ALKPHOS 49 01/14/2022   AST 28 01/14/2022   ALT 14 01/14/2022   ANIONGAP 6 01/14/2022   Last lipids Lab Results  Component Value Date   CHOL 111 08/17/2021   HDL 42 08/17/2021   LDLCALC 52 08/17/2021   LDLDIRECT 53 02/17/2021   TRIG 83 08/17/2021   CHOLHDL 2.6 08/17/2021   Last hemoglobin A1c Lab Results  Component Value Date   HGBA1C 6.3 (H) 08/17/2021   Last thyroid functions Lab Results  Component Value Date   TSH 1.320 02/17/2021   T3TOTAL 149 04/08/2019   Last vitamin D Lab  Results  Component Value Date   VD25OH 35.7 02/17/2021       Objective    BP 99/66   Pulse 79   Temp 97.7 F (36.5 C)   Ht 5' 7"  (1.702 m)   Wt 164 lb (74.4 kg)   SpO2 100%   BMI 25.69 kg/m  BP Readings from Last 3 Encounters:  01/17/22 99/66  01/14/22 118/80  08/17/21 116/70   Wt Readings from Last 3 Encounters:  01/17/22 164 lb (74.4 kg)  08/17/21 175 lb (79.4 kg)  07/26/21 172 lb (78 kg)    Physical Exam  General:  Well Developed, well nourished, appropriate for stated age.  Neuro:  Alert and oriented, extra-ocular muscles intact  HEENT:  Normocephalic, atraumatic, PERRL, neck supple  Skin:  no gross rash, warm, pink. Cardiac:  RRR, S1 S2 Respiratory: CTA B/L w/o wheezing, crackles or rales. Vascular:  Ext warm, no cyanosis apprec.; cap RF less 2 sec. Psych:  No HI/SI, judgement and insight good, Euthymic mood. Full Affect.   No results found for any visits on 01/17/22.  Assessment & Plan      Problem List Items Addressed This Visit   None Visit Diagnoses     Syncope, unspecified syncope type    -  Primary   Relevant Orders   CBC w/Diff   Comp Met (CMET)   HgB A1c   TSH   Ambulatory referral to Neurology      Syncope: -Reviewed ED note, labs and imaging studies. Patient was unable to proceed with MRI due to claustrophobia. Discussed with patient possibly dehydration contributing to syncopal episode and AMS. Pt declined admission for observation and further work-up. Patient was advised to follow-up with neurology and no driving for 6 months due to concerns of seizure-like activity, will place neurology referral. Pt deferred Holter monitor at this time. Will repeat CBC and CMP. Patient's A1c from 08/17/21 6.3, will repeat to evaluate for changes.  Return if symptoms worsen or fail to improve.        Lorrene Reid, PA-C  [facility] at [facility] [phone] (phone) [phone] (fax)  [place]

## 2022-01-18 LAB — CBC WITH DIFFERENTIAL/PLATELET
Basophils Absolute: 0.1 10*3/uL (ref 0.0–0.2)
Basos: 1 %
EOS (ABSOLUTE): 0.3 10*3/uL (ref 0.0–0.4)
Eos: 3 %
Hematocrit: 41.4 % (ref 37.5–51.0)
Hemoglobin: 13.9 g/dL (ref 13.0–17.7)
Immature Grans (Abs): 0 10*3/uL (ref 0.0–0.1)
Immature Granulocytes: 0 %
Lymphocytes Absolute: 1.7 10*3/uL (ref 0.7–3.1)
Lymphs: 17 %
MCH: 31.2 pg (ref 26.6–33.0)
MCHC: 33.6 g/dL (ref 31.5–35.7)
MCV: 93 fL (ref 79–97)
Monocytes Absolute: 0.5 10*3/uL (ref 0.1–0.9)
Monocytes: 5 %
Neutrophils Absolute: 7.7 10*3/uL — ABNORMAL HIGH (ref 1.4–7.0)
Neutrophils: 74 %
Platelets: 288 10*3/uL (ref 150–450)
RBC: 4.45 x10E6/uL (ref 4.14–5.80)
RDW: 12.7 % (ref 11.6–15.4)
WBC: 10.3 10*3/uL (ref 3.4–10.8)

## 2022-01-18 LAB — COMPREHENSIVE METABOLIC PANEL
ALT: 13 IU/L (ref 0–44)
AST: 16 IU/L (ref 0–40)
Albumin/Globulin Ratio: 1.5 (ref 1.2–2.2)
Albumin: 4.4 g/dL (ref 3.9–4.9)
Alkaline Phosphatase: 62 IU/L (ref 44–121)
BUN/Creatinine Ratio: 11 (ref 10–24)
BUN: 9 mg/dL (ref 8–27)
Bilirubin Total: 0.6 mg/dL (ref 0.0–1.2)
CO2: 23 mmol/L (ref 20–29)
Calcium: 9.5 mg/dL (ref 8.6–10.2)
Chloride: 103 mmol/L (ref 96–106)
Creatinine, Ser: 0.81 mg/dL (ref 0.76–1.27)
Globulin, Total: 2.9 g/dL (ref 1.5–4.5)
Glucose: 119 mg/dL — ABNORMAL HIGH (ref 70–99)
Potassium: 4.2 mmol/L (ref 3.5–5.2)
Sodium: 141 mmol/L (ref 134–144)
Total Protein: 7.3 g/dL (ref 6.0–8.5)
eGFR: 98 mL/min/{1.73_m2} (ref >59)

## 2022-01-18 LAB — TSH: TSH: 1.39 u[IU]/mL (ref 0.450–4.500)

## 2022-01-18 LAB — HEMOGLOBIN A1C
Est. average glucose Bld gHb Est-mCnc: 134 mg/dL
Hgb A1c MFr Bld: 6.3 % — ABNORMAL HIGH (ref 4.8–5.6)

## 2022-01-23 ENCOUNTER — Encounter: Payer: Self-pay | Admitting: Physician Assistant

## 2022-02-09 ENCOUNTER — Ambulatory Visit: Payer: 59 | Admitting: Neurology

## 2022-02-17 ENCOUNTER — Telehealth: Payer: Self-pay | Admitting: Medical-Surgical

## 2022-02-17 ENCOUNTER — Telehealth: Payer: Self-pay | Admitting: Physician Assistant

## 2022-02-17 ENCOUNTER — Ambulatory Visit
Admission: EM | Admit: 2022-02-17 | Discharge: 2022-02-17 | Disposition: A | Discharge status: Other Acute Inpatient Hospital | Payer: 59

## 2022-02-20 ENCOUNTER — Ambulatory Visit (INDEPENDENT_AMBULATORY_CARE_PROVIDER_SITE_OTHER): Payer: 59 | Admitting: Physician Assistant

## 2022-02-20 ENCOUNTER — Encounter: Payer: Self-pay | Admitting: Physician Assistant

## 2022-02-20 VITALS — BP 128/83 | HR 84 | Temp 97.6°F | Ht 68.0 in | Wt 164.0 lb

## 2022-02-20 DIAGNOSIS — R42 Dizziness and giddiness: Secondary | ICD-10-CM

## 2022-02-20 DIAGNOSIS — R69 Illness, unspecified: Secondary | ICD-10-CM | POA: Diagnosis not present

## 2022-02-20 DIAGNOSIS — F419 Anxiety disorder, unspecified: Secondary | ICD-10-CM

## 2022-02-20 NOTE — Patient Instructions (Signed)

## 2022-02-20 NOTE — Progress Notes (Signed)
Established patient visit   Patient: Douglas Barnes   DOB: June 28, 1955   66 y.o. Male  MRN: [id number] Visit Date: 02/20/2022  Chief Complaint  Patient presents with   Follow-up   Subjective    HPI  Patient presents to discuss return to work. Patient reports last week got dizzy when standing up too quick which occurred at work and was advised to go home and HR is requesting RTW note. Reports dizziness lasted 1-2 minutes and then resolved. No chest pain, syncope or shortness of breath with dizzy spells. Feels like his medications are also contributing to his dizziness. Takes finasteride and Hyzaar in the morning, takes atorvastatin and omeprazole at bedtime. Does wear compression socks and tries to stay hydrated. Patient does work 60+ per week and his job is stressful. Patient does have a hx of anxiety and panic attacks. Tries to do breathing and manage on his own but has noticed feeling more anxious and emotional.      02/20/2022    9:25 AM 01/17/2022    1:16 PM 08/17/2021    9:53 AM 07/26/2021    7:58 AM 02/17/2021    2:55 PM  Depression screen PHQ 2/9  Decreased Interest 1 0 0 0 1  Down, Depressed, Hopeless 1 0 0 0 0  PHQ - 2 Score 2 0 0 0 1  Altered sleeping 3 0 3  1  Tired, decreased energy 1 0 1  1  Change in appetite 1 0 1  0  Feeling bad or failure about yourself  1 0 0  0  Trouble concentrating 1 0 1  1  Moving slowly or fidgety/restless 1 0 0  0  Suicidal thoughts 0 0 0  0  PHQ-9 Score 10 0 6  4  Difficult doing work/chores Somewhat difficult Not difficult at all Somewhat difficult  Somewhat difficult      02/20/2022    9:26 AM 01/17/2022    1:16 PM 08/17/2021    9:54 AM 02/17/2021    2:56 PM  GAD 7 : Generalized Anxiety Score  Nervous, Anxious, on Edge 3 0 1 1  Control/stop worrying 3 0 3 0  Worry too much - different things 3 0 1 0  Trouble relaxing 3 0 3 1  Restless 3 0 3 3  Easily annoyed or irritable 3 0 3 1  Afraid - awful might happen 3 0 1 0  Total GAD 7  Score 21 0 15 6  Anxiety Difficulty Very difficult Not difficult at all Somewhat difficult Somewhat difficult    Medications: Outpatient Medications Prior to Visit  Medication Sig   Acetaminophen 500 MG capsule Take by mouth.   ALPRAZolam (XANAX) 0.5 MG tablet Take 1-2 tablets by mouth 30 minutes before flying.   atorvastatin (LIPITOR) 80 MG tablet TAKE ONE-HALF TABLET BY MOUTH ONE TIME DAILY   finasteride (PROSCAR) 5 MG tablet Take 5 mg by mouth daily.   ibuprofen (ADVIL) 200 MG tablet 4-6 tablets   losartan-hydrochlorothiazide (HYZAAR) 100-25 MG tablet Take 1 tablet by mouth daily.   omeprazole (PRILOSEC) 20 MG capsule TAKE 1 CAPSULE BY MOUTH DAILY AT BEDTIME   sildenafil (VIAGRA) 100 MG tablet 1/2 TABLET EVERY DAY AS NEEDED   No facility-administered medications prior to visit.    Review of Systems Review of Systems:  A fourteen system review of systems was performed and found to be positive as per HPI.  Last CBC Lab Results  Component Value Date   WBC 10.3  01/17/2022   HGB 13.9 01/17/2022   HCT 41.4 01/17/2022   MCV 93 01/17/2022   MCH 31.2 01/17/2022   RDW 12.7 01/17/2022   PLT 288 24/58/0998   Last metabolic panel Lab Results  Component Value Date   GLUCOSE 119 (H) 01/17/2022   NA 141 01/17/2022   K 4.2 01/17/2022   CL 103 01/17/2022   CO2 23 01/17/2022   BUN 9 01/17/2022   CREATININE 0.81 01/17/2022   EGFR 98 01/17/2022   CALCIUM 9.5 01/17/2022   PHOS 2.9 05/31/2017   PROT 7.3 01/17/2022   ALBUMIN 4.4 01/17/2022   LABGLOB 2.9 01/17/2022   AGRATIO 1.5 01/17/2022   BILITOT 0.6 01/17/2022   ALKPHOS 62 01/17/2022   AST 16 01/17/2022   ALT 13 01/17/2022   ANIONGAP 6 01/14/2022   Last lipids Lab Results  Component Value Date   CHOL 111 08/17/2021   HDL 42 08/17/2021   LDLCALC 52 08/17/2021   LDLDIRECT 53 02/17/2021   TRIG 83 08/17/2021   CHOLHDL 2.6 08/17/2021   Last hemoglobin A1c Lab Results  Component Value Date   HGBA1C 6.3 (H) 01/17/2022    Last thyroid functions Lab Results  Component Value Date   TSH 1.390 01/17/2022   T3TOTAL 149 04/08/2019   Last vitamin D Lab Results  Component Value Date   VD25OH 35.7 02/17/2021       Objective    BP 128/83   Pulse 84   Temp 97.6 F (36.4 C) (Temporal)   Ht _0  (1.727 m)   Wt 164 lb (74.4 kg)   BMI 24.94 kg/m  BP Readings from Last 3 Encounters:  02/20/22 128/83  01/17/22 99/66  01/14/22 118/80   Wt Readings from Last 3 Encounters:  02/20/22 164 lb (74.4 kg)  01/17/22 164 lb (74.4 kg)  08/17/21 175 lb (79.4 kg)    Physical Exam  General:  Cooperative, in no acute distress, appropriate for stated age.  Neuro:  Alert and oriented,  extra-ocular muscles intact  HEENT:  Normocephalic, atraumatic, neck supple  Skin:  no gross rash, warm, pink. Cardiac:  RRR, S1 S2 Respiratory: CTA B/L  Vascular:  Ext warm, no cyanosis apprec.; cap RF less 2 sec. Psych:  No HI/SI, judgement and insight good, anxious and emotional mood. Full Affect.   No results found for any visits on 02/20/22.  Assessment & Plan      Problem List Items Addressed This Visit       Other   Anxiety   Other Visit Diagnoses     Dizziness    -  Primary      Dizziness: -No red flag s/sx present at this time. S/sx suggestive of orthostatic hypotension. Discussed with patient slow position changes, to continue compression socks and adequate hydration. BP today wnl's. Provided work note to return today, no restrictions. If continues to have dizziness with position changes then we can consider medication adjustments for blood pressure med.  Anxiety: -PHQ-9 score of 10 and GAD-7 score of 21. Patient deferred medication therapy and encouraged to consider [facility] therapy and establishing with therapist. Discussed uncontrolled anxiety also likely contributing to his symptoms. Continue with non-pharmacologic therapy including breathing techniques.   Return if symptoms worsen or fail to improve.         Lorrene Reid, PA-C  [facility] at [facility] [phone] (phone) [phone] (fax)  [place]

## 2022-02-26 ENCOUNTER — Other Ambulatory Visit: Payer: Self-pay | Admitting: Physician Assistant

## 2022-02-26 DIAGNOSIS — K219 Gastro-esophageal reflux disease without esophagitis: Secondary | ICD-10-CM

## 2022-03-09 ENCOUNTER — Other Ambulatory Visit: Payer: Self-pay | Admitting: Physician Assistant

## 2022-03-09 DIAGNOSIS — E7800 Pure hypercholesterolemia, unspecified: Secondary | ICD-10-CM

## 2022-03-09 DIAGNOSIS — E786 Lipoprotein deficiency: Secondary | ICD-10-CM

## 2022-03-09 DIAGNOSIS — I10 Essential (primary) hypertension: Secondary | ICD-10-CM

## 2022-03-16 ENCOUNTER — Ambulatory Visit (INDEPENDENT_AMBULATORY_CARE_PROVIDER_SITE_OTHER): Payer: 59 | Admitting: Physician Assistant

## 2022-03-16 ENCOUNTER — Encounter: Payer: Self-pay | Admitting: Physician Assistant

## 2022-03-16 VITALS — BP 107/70 | HR 75 | Ht 68.0 in | Wt 166.1 lb

## 2022-03-16 DIAGNOSIS — F410 Panic disorder [episodic paroxysmal anxiety] without agoraphobia: Secondary | ICD-10-CM

## 2022-03-16 DIAGNOSIS — R69 Illness, unspecified: Secondary | ICD-10-CM | POA: Diagnosis not present

## 2022-03-16 DIAGNOSIS — F419 Anxiety disorder, unspecified: Secondary | ICD-10-CM | POA: Diagnosis not present

## 2022-03-16 MED ORDER — ALPRAZOLAM 1 MG PO TABS: 1.0000 mg | ORAL_TABLET | Freq: Two times a day (BID) | ORAL | 0 refills | Status: DC | PRN

## 2022-03-16 NOTE — Patient Instructions (Signed)
Panic Attack ?A panic attack is a sudden episode of severe anxiety, fear, or discomfort that causes physical and emotional symptoms. A panic attack may be in response to something frightening, or it may occur for no known reason. ?Symptoms of a panic attack can be similar to symptoms of a heart attack or stroke. It is important to see your health care provider when you have a panic attack so that these conditions can be ruled out. ?What are the causes? ?A panic attack may be caused by: ?An extreme, life-threatening situation, such as a war or natural disaster. ?An anxiety disorder, such as post-traumatic stress disorder. ?Depression. ?Panic disorder. ?Certain medical conditions, including heart problems, neurological conditions, and infections. ?Other causes may include: ?Certain over-the-counter and prescription medicines. ?Supplements that increase anxiety. ?Illegal drugs that increase heart rate and blood pressure, such as methamphetamine. ?What increases the risk? ?You are more likely to develop this condition if: ?You have another mental health condition. ?You use alcohol, illegal drugs, or other substances. ?You are under extreme stress. ?A life event is causing increased feelings of anxiety and depression. ?What are the signs or symptoms? ?A panic attack starts suddenly, usually lasts 5-10 minutes, and occurs with one or more of the following: ?A pounding heart, or a feeling that your heart is beating irregularly or faster than normal (palpitations). ?Sweating, trembling, or shaking. ?Shortness of breath, feeling smothered, or feeling choked. ?Chest pain or discomfort. ?Nausea or a strange feeling in your stomach. ?Dizziness, feeling light-headed, or feeling like you might faint. ?Other symptoms may include: ?Chills or hot flashes. ?Numbness or tingling in your lips, hands, or feet. ?Feeling confused, or feeling that you are not yourself. ?Fear of losing control or of being emotionally unstable, or fear of  dying. ?How is this diagnosed? ?A panic attack is diagnosed with an assessment by your health care provider. During the assessment, your health care provider will ask questions about: ?Your history of anxiety, depression, and panic attacks. ?Your medical history. ?Whether you drink alcohol, use drugs, take supplements, or take medicines. Be honest about your substance use. ?Your health care provider may also: ?Order blood tests or other kinds of tests to rule out serious medical conditions. ?Refer you to a mental health professional for further evaluation. ?How is this treated? ?A panic attack is a symptom of another condition. Treatment depends on the cause of the panic attack. ?If the cause is a medical problem, your health care provider will treat that problem or refer you to a specialist. ?If the cause is emotional, you may be given anti-anxiety medicines or referred to a counselor. Anti-anxiety medicines may reduce how often attacks happen, reduce how severe the attacks are, and lower anxiety. ?If the cause is a medicine, your health care provider may tell you to stop the medicine, change your dose, or take a different medicine. ?If the cause is an illegal drug, treatment may involve letting the drug wear off and taking medicine to help the drug leave your body or to stop its effects. Attacks caused by heavy drug use may continue even if you stop using the drug. ?Most panic attacks go away with treatment of the underlying problem. If you have panic attacks often, you may have a condition called panic disorder. ?Follow these instructions at home: ?Alcohol use ?Do not drink alcohol if: ?Your health care provider tells you not to drink. ?You are pregnant, may be pregnant, or are planning to become pregnant. ?If you drink alcohol: ?Limit how much   you have to: ?0-1 drink a day for women. ?0-2 drinks a day for men. ?Know how much alcohol is in your drink. In the [place] one drink equals one 12 oz bottle of beer (355  mL), one 5 oz glass of wine (148 mL), or one 1? oz glass of hard liquor (44 mL). ?General instructions ?Take over-the-counter and prescription medicines only as told by your health care provider. ?If you feel anxious, limit your caffeine intake. ?Take good care of your physical and mental health by: ?Eating a balanced diet that includes plenty of fresh fruits and vegetables, whole grains, lean meats, and low-fat dairy. ?Getting plenty of rest. Try to get 7-8 hours of uninterrupted sleep each night. ?Exercising regularly. Try to get 30 minutes of physical activity at least 5 days a week. ?Do not use any products that contain nicotine or tobacco. These products include cigarettes, chewing tobacco, and vaping devices, such as e-cigarettes. If you need help quitting, ask your health care provider. ?Keep all follow-up visits. This is important. Panic attacks may have underlying physical or emotional problems that take time to accurately diagnose. ?Where to find more information ?Substance Abuse and Mental Health Services Administration (SAMHSA): samhsa.gov ?National Institute of Mental Health (NIMH): www.nimh.nih.gov ?Contact a health care provider if: ?Your symptoms do not improve, or they get worse. ?You are not able to take your medicine as prescribed because of side effects. ?Get help right away if: ?You have thoughts about hurting yourself or others. ?Get help right away if you feel like you may hurt yourself or others, or have thoughts about taking your own life. Go to your nearest emergency room or: ?Call 911. ?Call the National Suicide Prevention Lifeline at 1-800-273-8255 or 988. This is open 24 hours a day. ?Text the Crisis Text Line at 741741. ?Summary ?A panic attack is a sudden episode of severe anxiety, fear, or discomfort that causes physical and emotional symptoms. ?Always see a health care provider to have the reasons for the panic attack correctly diagnosed. ?If your panic attack was caused by a  physical problem, follow your health care provider's suggestions for medicine, referral to a specialist, and lifestyle changes. ?If your panic attack was caused by an emotional problem, follow through with counseling from a qualified mental health specialist. ?If you feel like you may hurt yourself or others, call 911 and get help right away. ?This information is not intended to replace advice given to you by your health care provider. Make sure you discuss any questions you have with your health care provider. ?Document Revised: 12/23/2020 Document Reviewed: 12/23/2020 ?Elsevier Patient Education ? 2023 [employer] ? ?

## 2022-03-16 NOTE — Progress Notes (Signed)
Established patient visit   Patient: Douglas Barnes   DOB: [birth year]   66 y.o. Male  MRN: [id number] Visit Date: 03/16/2022  Chief Complaint  Patient presents with   Follow-up   Subjective    HPI  Patient presents to discuss mood medication. Patient reports experiencing more frequent panic attacks. States feeling anxious and emotional so his mother-in-law gave him a dose of alprazolam which helped calm him down. States one day he took alprazolam twice daily and was completely fine that day. Reports is in the process of establishing with the psychiatrist his wife sees and requesting a referral. Also reports he reached out to his urologist for an appointment due to having blood in his urine.       03/16/2022   10:13 AM 02/20/2022    9:25 AM 01/17/2022    1:16 PM 08/17/2021    9:53 AM 07/26/2021    7:58 AM  Depression screen PHQ 2/9  Decreased Interest 1 1 0 0 0  Down, Depressed, Hopeless 2 1 0 0 0  PHQ - 2 Score 3 2 0 0 0  Altered sleeping 2 3 0 3   Tired, decreased energy 2 1 0 1   Change in appetite 1 1 0 1   Feeling bad or failure about yourself  1 1 0 0   Trouble concentrating 2 1 0 1   Moving slowly or fidgety/restless 1 1 0 0   Suicidal thoughts 0 0 0 0   PHQ-9 Score 12 10 0 6   Difficult doing work/chores  Somewhat difficult Not difficult at all Somewhat difficult       03/16/2022   10:13 AM 02/20/2022    9:26 AM 01/17/2022    1:16 PM 08/17/2021    9:54 AM  GAD 7 : Generalized Anxiety Score  Nervous, Anxious, on Edge 0 3 0 1  Control/stop worrying 0 3 0 3  Worry too much - different things 0 3 0 1  Trouble relaxing 0 3 0 3  Restless 0 3 0 3  Easily annoyed or irritable 0 3 0 3  Afraid - awful might happen 0 3 0 1  Total GAD 7 Score 0 21 0 15  Anxiety Difficulty  Very difficult Not difficult at all Somewhat difficult        Medications: Outpatient Medications Prior to Visit  Medication Sig   Acetaminophen 500 MG capsule Take by mouth.   atorvastatin  (LIPITOR) 80 MG tablet TAKE ONE-HALF TABLET BY MOUTH ONE TIME DAILY **MUST CALL DR. FOR APPOINTMENT FOR FURTHER REFILLS**   finasteride (PROSCAR) 5 MG tablet Take 5 mg by mouth daily.   ibuprofen (ADVIL) 200 MG tablet 4-6 tablets   losartan-hydrochlorothiazide (HYZAAR) 100-25 MG tablet TAKE ONE TABLET BY MOUTH ONE TIME DAILY   omeprazole (PRILOSEC) 20 MG capsule TAKE ONE CAPSULE BY MOUTH AT BEDTIME   sildenafil (VIAGRA) 100 MG tablet 1/2 TABLET EVERY DAY AS NEEDED   [DISCONTINUED] ALPRAZolam (XANAX) 0.5 MG tablet Take 1-2 tablets by mouth 30 minutes before flying.   No facility-administered medications prior to visit.    Review of Systems Review of Systems:  A fourteen system review of systems was performed and found to be positive as per HPI.  Last CBC Lab Results  Component Value Date   WBC 10.3 01/17/2022   HGB 13.9 01/17/2022   HCT 41.4 01/17/2022   MCV 93 01/17/2022   MCH 31.2 01/17/2022   RDW 12.7 01/17/2022   PLT 288 01/17/2022  Last metabolic panel Lab Results  Component Value Date   GLUCOSE 119 (H) 01/17/2022   NA 141 01/17/2022   K 4.2 01/17/2022   CL 103 01/17/2022   CO2 23 01/17/2022   BUN 9 01/17/2022   CREATININE 0.81 01/17/2022   EGFR 98 01/17/2022   CALCIUM 9.5 01/17/2022   PHOS 2.9 05/31/2017   PROT 7.3 01/17/2022   ALBUMIN 4.4 01/17/2022   LABGLOB 2.9 01/17/2022   AGRATIO 1.5 01/17/2022   BILITOT 0.6 01/17/2022   ALKPHOS 62 01/17/2022   AST 16 01/17/2022   ALT 13 01/17/2022   ANIONGAP 6 01/14/2022   Last lipids Lab Results  Component Value Date   CHOL 111 08/17/2021   HDL 42 08/17/2021   LDLCALC 52 08/17/2021   LDLDIRECT 53 02/17/2021   TRIG 83 08/17/2021   CHOLHDL 2.6 08/17/2021   Last hemoglobin A1c Lab Results  Component Value Date   HGBA1C 6.3 (H) 01/17/2022   Last thyroid functions Lab Results  Component Value Date   TSH 1.390 01/17/2022   T3TOTAL 149 04/08/2019   Last vitamin D Lab Results  Component Value Date   VD25OH  35.7 02/17/2021       Objective    BP 107/70   Pulse 75   Ht 5' 8" (1.727 m)   Wt 166 lb 1.9 oz (75.4 kg)   SpO2 99%   BMI 25.26 kg/m  BP Readings from Last 3 Encounters:  03/16/22 107/70  02/20/22 128/83  01/17/22 99/66   Wt Readings from Last 3 Encounters:  03/16/22 166 lb 1.9 oz (75.4 kg)  02/20/22 164 lb (74.4 kg)  01/17/22 164 lb (74.4 kg)    Physical Exam  General:  Pleasant and cooperative, appropriate for stated age.  Neuro:  Alert and oriented,  extra-ocular muscles intact  HEENT:  Normocephalic, atraumatic, neck supple  Skin:  no gross rash, warm, pink. Cardiac:  RRR, S1 S2 Respiratory: CTA B/L  Vascular:  Ext warm, no cyanosis apprec.; cap RF less 2 sec. Psych:  No HI/SI, judgement and insight good, anxious mood. Full Affect.   No results found for any visits on 03/16/22.  Assessment & Plan      Problem List Items Addressed This Visit       Other   Anxiety   Relevant Medications   ALPRAZolam (XANAX) 1 MG tablet   Other Visit Diagnoses     Panic attack    -  Primary   Relevant Medications   ALPRAZolam (XANAX) 1 MG tablet      Panic attack: -PDMP reviewed, last rx for alprazolam 0.5 mg filled September 2022. -Discussed with patient will provide 1 time rx for alprazolam 1 mg to take twice daily as needed for anxiety for Q:45 tablets to help him until he gets in with the psychiatrist within the next few weeks. Discussed with patient office policy regarding controlled substance policy for benzodiazapine which includes only 2 refills per yr. Pt verbalized understanding. Will defer additional medication adjustments to psychiatry. Patient will forward office information for where he wants his referral to be sent.  Recommend to follow-up with urology regarding his hematuria and discuss work note regarding lifting restrictions.  Return in about 4 months (around 07/17/2022) for Mood, HTN, med management .        Lorrene Reid, PA-C  [facility]  Primary Care at [facility] [phone] (phone) [phone] (fax)  [place]

## 2022-03-20 IMAGING — MR MR PROSTATE WO/W CM
12 series · 48 of 48 positions shown · IV contrast (multihance)
Comparison: 11/25/2018

CLINICAL DATA: Prostate carcinoma, Gleason score 6. Active
surveillance.

EXAM:
MR PROSTATE WITHOUT AND WITH CONTRAST
TECHNIQUE: Multiplanar multisequence MRI images were obtained of the pelvis
centered about the prostate. Pre and post contrast images were
obtained.
CONTRAST:  15mL MULTIHANCE GADOBENATE DIMEGLUMINE 529 MG/ML IV SOLN

[Series 4: T2 · coronal · 3.0mm · 0.56mm/px · 1 of 23 slices shown (1 of 3)]
[im 1/23]
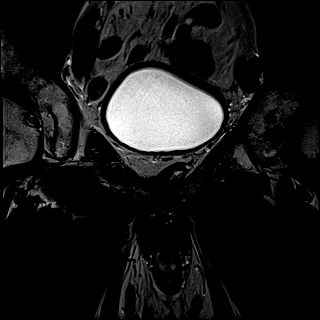

[Series 5: T1 · axial · 5.0mm · 1.25mm/px · 1 of 96 slices shown]
[im 1/96]
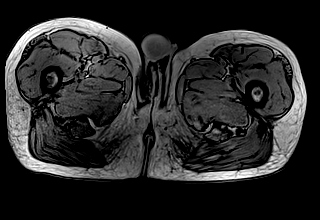

[Series 6: DWI · axial · 3.0mm · 1.75mm/px · z∈[-55,+38]mm · 2 of 96 slices shown (1 of 3)]
[im 1/96]
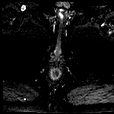
[im 96/96]
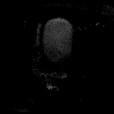

[Series 7: DWI · axial · 3.0mm · 1.75mm/px · 1 of 32 slices shown (2 of 3)]
[im 1/32]
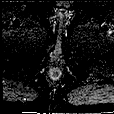

[Series 8: DWI · axial · 3.0mm · 1.75mm/px · 1 of 32 slices shown (3 of 3)]
[im 1/32]
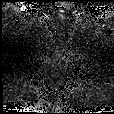

[Series 9: T2 · axial · 3.0mm · 0.56mm/px · 1 of 29 slices shown (2 of 3)]
[im 1/29]
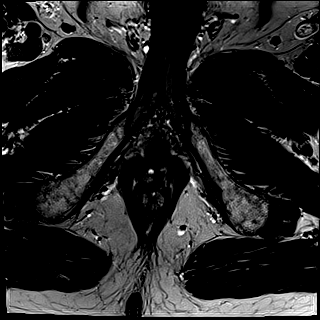

[Series 10: T2 · axial · 1.0mm · 1.04mm/px · z∈[-46,+41]mm · 2 of 88 slices shown (3 of 3)]
[im 1/88]
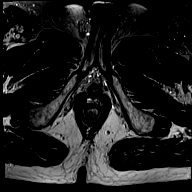
[im 88/88]
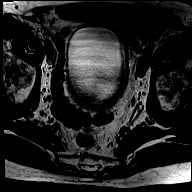

[Series 11: pre t1_twist_tra_dyn · axial · non-contrast · 3.5mm · 0.83mm/px · 1 of 24 slices shown]
[im 1/24]
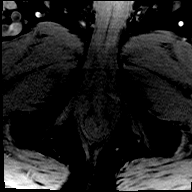

[Series 12: post t1_twist_tra_dyn-copy center · axial · non-contrast · 3.5mm · 0.83mm/px · z∈[-42,+39]mm · 17 of 720 slices shown]
[im 1/720]
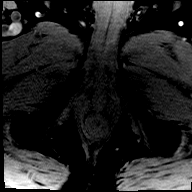
[im 45/720]
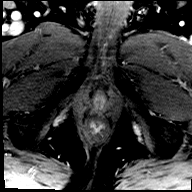
[im 90/720]
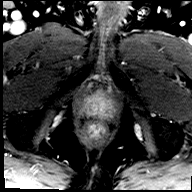
[im 135/720]
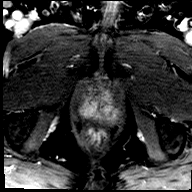
[im 180/720]
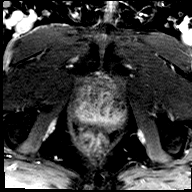
[im 225/720]
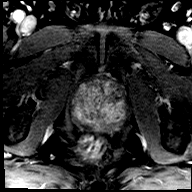
[im 270/720]
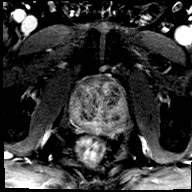
[im 315/720]
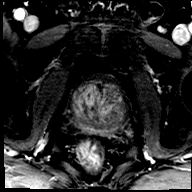
[im 360/720]
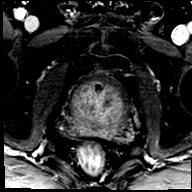
[im 405/720]
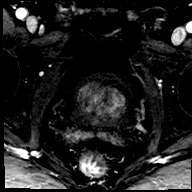
[im 450/720]
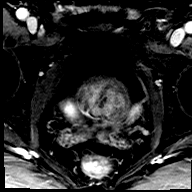
[im 495/720]
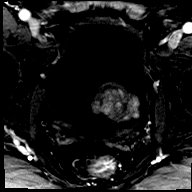
[im 540/720]
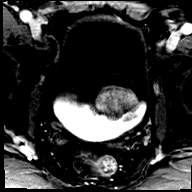
[im 585/720]
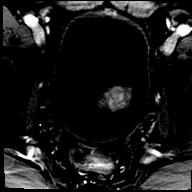
[im 630/720]
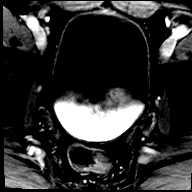
[im 675/720]
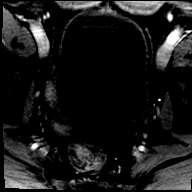
[im 720/720]
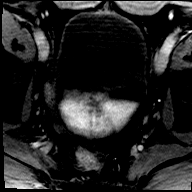

[Series 13: post t1_twist_tra_dyn-copy cent_sub · axial · 3.5mm · 0.83mm/px · z∈[-42,+39]mm · 17 of 696 slices shown]
[im 1/696]
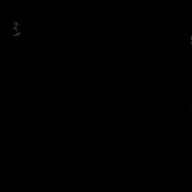
[im 44/696]
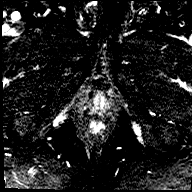
[im 87/696]
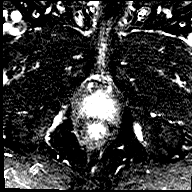
[im 131/696]
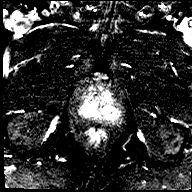
[im 174/696]
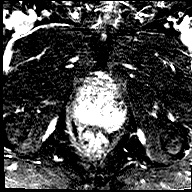
[im 218/696]
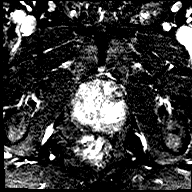
[im 261/696]
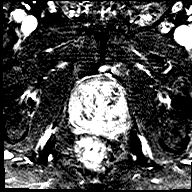
[im 305/696]
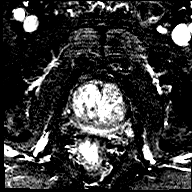
[im 348/696]
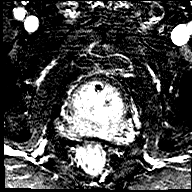
[im 391/696]
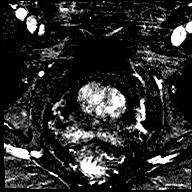
[im 435/696]
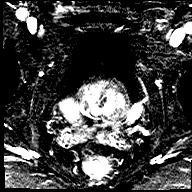
[im 478/696]
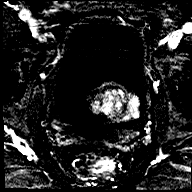
[im 522/696]
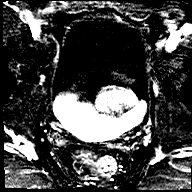
[im 565/696]
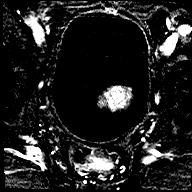
[im 609/696]
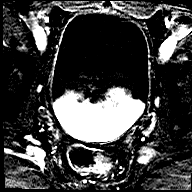
[im 652/696]
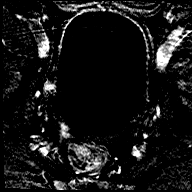
[im 696/696]
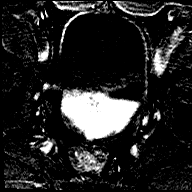

[Series 14: t1_vibe_dixon_tra_f · axial · 2.5mm · 0.91mm/px · z∈[-72,+125]mm · 2 of 80 slices shown]
[im 1/80]
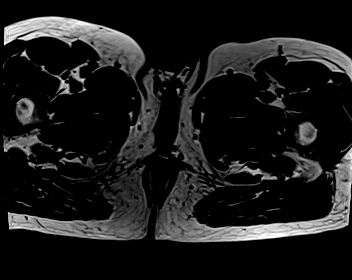
[im 80/80]
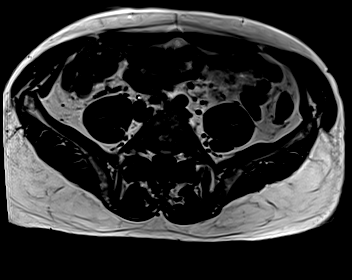

[Series 15: t1_vibe_dixon_tra_w · axial · 2.5mm · 0.91mm/px · z∈[-72,+125]mm · 2 of 80 slices shown]
[im 1/80]
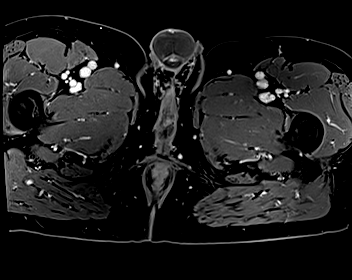
[im 80/80]
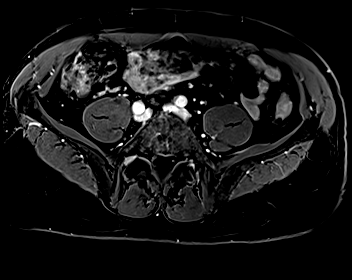

[48 of 48 positions shown; findings below may reference images not displayed]

FINDINGS: Prostate:

-- Peripheral Zone: Diffuse thinning is seen due to central gland
enlargement. No focal lesion seen on ADC or high b-value DWI
sequences.

-- Transition/Central Zone: Moderately enlarged with encapsulated
BPH nodules noted. Prominent median lobe hypertrophy is seen
indenting the bladder base. No suspicious nodules are identified on
T2-weighted or diffusion sequences.

-- Measurements/Volume:  6.7 by 4.7 x 5.0 cm (volume = 82 cm^3)

Transcapsular spread:  Absent

Seminal vesicle involvement:  Absent

Neurovascular bundle involvement:  Absent

Pelvic adenopathy: None visualized

Bone metastasis: None visualized

Other:  Sigmoid diverticulosis, without evidence of diverticulitis.
IMPRESSION: No radiographic evidence of high-grade prostate carcinoma. PI-RADS 1
(v2.1): Very Low (clinically significant cancer highly unlikely)

## 2022-03-21 DIAGNOSIS — C61 Malignant neoplasm of prostate: Secondary | ICD-10-CM | POA: Diagnosis not present

## 2022-03-21 DIAGNOSIS — R3912 Poor urinary stream: Secondary | ICD-10-CM | POA: Diagnosis not present

## 2022-03-21 DIAGNOSIS — N5201 Erectile dysfunction due to arterial insufficiency: Secondary | ICD-10-CM | POA: Diagnosis not present

## 2022-04-07 ENCOUNTER — Ambulatory Visit (HOSPITAL_BASED_OUTPATIENT_CLINIC_OR_DEPARTMENT_OTHER): Payer: 59 | Admitting: Psychiatry

## 2022-04-07 ENCOUNTER — Ambulatory Visit (HOSPITAL_COMMUNITY): Payer: 59 | Admitting: Psychiatry

## 2022-04-07 ENCOUNTER — Encounter (HOSPITAL_COMMUNITY): Payer: Self-pay | Admitting: Psychiatry

## 2022-04-07 VITALS — Wt 166.0 lb

## 2022-04-07 DIAGNOSIS — F1210 Cannabis abuse, uncomplicated: Secondary | ICD-10-CM | POA: Diagnosis not present

## 2022-04-07 DIAGNOSIS — F331 Major depressive disorder, recurrent, moderate: Secondary | ICD-10-CM | POA: Diagnosis not present

## 2022-04-07 DIAGNOSIS — F4312 Post-traumatic stress disorder, chronic: Secondary | ICD-10-CM | POA: Diagnosis not present

## 2022-04-07 DIAGNOSIS — F410 Panic disorder [episodic paroxysmal anxiety] without agoraphobia: Secondary | ICD-10-CM | POA: Diagnosis not present

## 2022-04-07 DIAGNOSIS — R69 Illness, unspecified: Secondary | ICD-10-CM | POA: Diagnosis not present

## 2022-04-07 MED ORDER — MIRTAZAPINE 15 MG PO TABS: 15.0000 mg | ORAL_TABLET | Freq: Every day | ORAL | 1 refills | Status: DC

## 2022-04-07 NOTE — Progress Notes (Signed)
[facility] Initial Assessment Note  Patient Location:Home Provider Location:Home Office   I connected with Otho Bellows by video and verified that I am talking with correct person using two identifiers.   I discussed the limitations, risks, security and privacy concerns of performing an evaluation and management service virtually and the availability of in person appointments. I also discussed with the patient that there may be a patient responsible charge related to this service. The patient expressed understanding and agreed to proceed.  Douglas Barnes [id number] 66 y.o.  04/07/2022 9:05 AM  Chief Complaint:  I was referred from my primary care.  I have a lot of anxiety attacks.  History of Present Illness:  Patient is 66 year old married, employed man with a history of depression, anxiety referred from PCP for management of above symptoms.  Patient reported for past year and a half having symptoms of severe anxiety and panic attack.  He described the symptoms comes out of the blue and described excessive crying, could not breathe, increased heart rate, could not talk and get very emotional.  He had tried to calm himself by taking breathing exercise, walk out of the situation, leaving the place to try to hide so no one can find him.  His PCP has given Xanax which worked for him and like him to see a psychiatrist.  Patient told in the beginning Xanax was given by his mother-in-law because he does not want to take the medication.  Patient reported significant stress from his job.  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  Patient told job is difficult because there are 56 employees and they are part-time and they have their own issues.  He reported a lot of responsibilities and he feels sometimes overwhelmed.  Patient told he used to handle these things much better but lately not sure why he is so emotional.  He also reported scared to go  to crowded places, poor sleep, racing thoughts, easily frustrated and irritable.  He also reported sometimes feeling hopeless and helpless but denies any suicidal thoughts.  He does not go outside because he does not feel motivated to do things.  Patient other stressors are lack of response from his kids.  He feels very sad because his 64 year old son does not communicate or see him anymore.  Patient reported he worked hard to get his custody when he was 66 years old and spent his life making his career but now his son keeping a lot of distance from him.  He admitted having some issues growing up because he was trying to discipline him but son had stopped communicating with him.  Patient admitted lonely and feels no one cares about him.  He reported a lot of worrying that something going to happen him.  He reported loss of appetite and desire to eat.  He had loss 9 pounds in 2 months.  He feels unmotivated to do things.  His energy level is down and sometimes he reported being isolated and withdrawn.  He admitted guilt about his past.  He recalled growing up very strong man that can handle any issues but now he feels falling apart.  He admitted smoking marijuana every day to calm himself so he can sleep.  Denies any current substance use.  He lives with his wife who has been married for 26 years.  Denies any suicidal thoughts, hallucination, paranoia, OCD or any aggressive or violent behavior.  Patient also reported history of  physical and mental trauma when he was in prison from [phone] for armed robbery charges.  He was taken multiple times to confinement sell which she described 4 x 4.  He had nightmares and flashback.  He never received any therapy but reported history of 14 days of rehab at [facility] he did suspect of opiate addiction.  Patient admitted history of abusing pain medication but recall after having withdrawals for not having the pain medicine he decided to stop pain medicine.  Patient lives  with his wife who is very supportive.  During the appointment patient appears very emotional, easy to cry, tearful.  He appears restless and rocking and talk about his past.  He is open to try medication to help his anxiety depression and also open to see a therapist.  Past Psychiatric History: History of anxiety, depression and PTSD.  Never seen psychiatrist before but given Wellbutrin and Prozac by PCP in the past.  He recalled side effects and never tried long enough.  His PCP prescribed Xanax.  No history of suicidal attempt, inpatient treatment, psychosis, mania.  History of addiction with pain medications.  Patient did 14 days rehab at [facility] in 2002.   Past Medical History:  Diagnosis Date   BPH (benign prostatic hypertrophy) with urinary retention    Complication of anesthesia    "fear of not waking up"   Foley catheter in place    Hepatitis C antibody test positive    dx 1997--  pt is getting treatment in near future (last enzymes 12 /2016 stable per pt and in epic)   History of colon polyps    History of hypertension    no medication since 2014 lost weight and decrease stress   Hypertension      Traumatic Head Injury: Denies any history of traumatic head injury.  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    Psychosocial History; Patient born and raised in [place].  He reported belong to a [place] history and moved few places.  He reported his father was very strict and discipline.  Patient married 3 times.  His first marriage ended because wife reported he is work alcoholic.  He has a 40 year old son from his first marriage.  He reported had a difficult custody battle to get his 50 years old son at that time and spent time many to make him  successful.  Patient regret that now his son does not spend time with him.  Patient reported his second marriage ended because wife was using drugs and she accused him hitting and went to jail and once he released from the jail he and the marriage.  He is married to his current wife for 26 years. Patient has a 65 year old daughter who lives in [place].  He find out about her daughter when she was 2 years old by a woman who claimed that he is the father.  Initially patient got into shock however after DNA testing found that he is the father of daughter.  Patient is close to her daughter who lives in [place] and she has an autistic child.  Patient also have a 8 year old stepdaughter.  Patient has 5 grandkids.  Patient had good support from his wife and his in-laws.  Legal History; History of  jail time for 5 years for armed robbery charges.  No current legal issues.  History Of Abuse; History of physical and emotional verbal abuse when he was in jail.  He went a few times to solitary cell which he described only 4 x 4.  He has nightmares, flashback.    Substance Abuse History; History of using cocaine, tried once IV drug and abusing pain medication.  After going through withdrawal he stopped the pain medicine.  He admitted to smoking marijuana for many years to calm his nerves.  Neurologic: Headache: No Seizure: No Paresthesias: No   Outpatient Encounter Medications as of 04/07/2022  Medication Sig   Acetaminophen 500 MG capsule Take by mouth.   ALPRAZolam (XANAX) 1 MG tablet Take 1 tablet (1 mg total) by mouth 2 (two) times daily as needed for anxiety.   atorvastatin (LIPITOR) 80 MG tablet TAKE ONE-HALF TABLET BY MOUTH ONE TIME DAILY **MUST CALL DR. FOR APPOINTMENT FOR FURTHER REFILLS**   finasteride (PROSCAR) 5 MG tablet Take 5 mg by mouth daily.   ibuprofen (ADVIL) 200 MG tablet 4-6 tablets   losartan-hydrochlorothiazide (HYZAAR) 100-25 MG tablet TAKE ONE TABLET BY MOUTH ONE TIME DAILY    omeprazole (PRILOSEC) 20 MG capsule TAKE ONE CAPSULE BY MOUTH AT BEDTIME   sildenafil (VIAGRA) 100 MG tablet 1/2 TABLET EVERY DAY AS NEEDED   No facility-administered encounter medications on file as of 04/07/2022.    Recent Results (from the past 2160 hour(s))  CBC with Differential     Status: Abnormal   Collection Time: 01/14/22  2:40 PM  Result Value Ref Range   WBC 9.5 4.0 - 10.5 K/uL   RBC 3.76 (L) 4.22 - 5.81 MIL/uL   Hemoglobin 11.8 (L) 13.0 - 17.0 g/dL   HCT 34.6 (L) 39.0 - 52.0 %   MCV 92.0 80.0 - 100.0 fL   MCH 31.4 26.0 - 34.0 pg   MCHC 34.1 30.0 - 36.0 g/dL   RDW 12.5 11.5 - 15.5 %   Platelets 239 150 - 400 K/uL   nRBC 0.0 0.0 - 0.2 %   Neutrophils Relative % 53 %   Neutro Abs 5.1 1.7 - 7.7 K/uL   Lymphocytes Relative 34 %   Lymphs Abs 3.2 0.7 - 4.0 K/uL   Monocytes Relative 8 %   Monocytes Absolute 0.8 0.1 - 1.0 K/uL   Eosinophils Relative 4 %   Eosinophils Absolute 0.3 0.0 - 0.5 K/uL   Basophils Relative 1 %   Basophils Absolute 0.1 0.0 - 0.1 K/uL   Immature Granulocytes 0 %   Abs Immature Granulocytes 0.02 0.00 - 0.07 K/uL    Comment: Performed at [facility], [address] [place],  [address]  Comprehensive metabolic panel     Status: Abnormal   Collection Time: 01/14/22  2:40 PM  Result Value Ref Range   Sodium 140 135 - 145 mmol/L   Potassium 3.2 (L) 3.5 - 5.1 mmol/L   Chloride 108 98 - 111 mmol/L   CO2 26 22 - 32 mmol/L   Glucose, Bld 153 (H) 70 - 99 mg/dL    Comment: Glucose reference range applies only to samples taken after fasting for at least 8 hours.   BUN 25 (H) 8 - 23 mg/dL   Creatinine, Ser 1.16 0.61 - 1.24 mg/dL   Calcium 8.4 (L) 8.9 - 10.3 mg/dL   Total Protein 6.7 6.5 - 8.1 g/dL   Albumin 3.8 3.5 - 5.0 g/dL   AST 28 15 -  41 U/L   ALT 14 0 - 44 U/L   Alkaline Phosphatase 49 38 - 126 U/L   Total Bilirubin 0.9 0.3 - 1.2 mg/dL   GFR, Estimated >60 >60 mL/min    Comment: (NOTE) Calculated using the CKD-EPI Creatinine  Equation (2021)    Anion gap 6 5 - 15    Comment: Performed at [facility], [address] [place], [place] [address]  Ethanol     Status: None   Collection Time: 01/14/22  2:40 PM  Result Value Ref Range   Alcohol, Ethyl (B) <10 <10 mg/dL    Comment: (NOTE) Lowest detectable limit for serum alcohol is 10 mg/dL.  For medical purposes only. Performed at [facility], [place], [place] [address]   Troponin I (High Sensitivity)     Status: None   Collection Time: 01/14/22  2:40 PM  Result Value Ref Range   Troponin I (High Sensitivity) 3 <18 ng/L    Comment: (NOTE) Elevated high sensitivity troponin I (hsTnI) values and significant  changes across serial measurements may suggest ACS but many other  chronic and acute conditions are known to elevate hsTnI results.  Refer to the "Links" section for chest pain algorithms and additional  guidance. Performed at [facility], [address] [place], [place] [address]   Urine Drug Screen, Qualitative     Status: Abnormal   Collection Time: 01/14/22  3:37 PM  Result Value Ref Range   Tricyclic, Ur Screen NONE DETECTED NONE DETECTED   Amphetamines, Ur Screen NONE DETECTED NONE DETECTED   MDMA (Ecstasy)Ur Screen NONE DETECTED NONE DETECTED   Cocaine Metabolite,Ur [place] NONE DETECTED NONE DETECTED   Opiate, Ur Screen NONE DETECTED NONE DETECTED   Phencyclidine (PCP) Ur S NONE DETECTED NONE DETECTED   Cannabinoid 50 Ng, Ur [place] POSITIVE (A) NONE DETECTED   Barbiturates, Ur Screen NONE DETECTED NONE DETECTED   Benzodiazepine, Ur Scrn POSITIVE (A) NONE DETECTED   Methadone Scn, Ur NONE DETECTED NONE DETECTED    Comment: (NOTE) Tricyclics + metabolites, urine    Cutoff 1000 ng/mL Amphetamines + metabolites, urine  Cutoff 1000 ng/mL MDMA (Ecstasy), urine              Cutoff 500 ng/mL Cocaine Metabolite, urine          Cutoff 300 ng/mL Opiate + metabolites, urine        Cutoff 300  ng/mL Phencyclidine (PCP), urine         Cutoff 25 ng/mL Cannabinoid, urine                 Cutoff 50 ng/mL Barbiturates + metabolites, urine  Cutoff 200 ng/mL Benzodiazepine, urine              Cutoff 200 ng/mL Methadone, urine                   Cutoff 300 ng/mL  The urine drug screen provides only a preliminary, unconfirmed analytical test result and should not be used for non-medical purposes. Clinical consideration and professional judgment should be applied to any positive drug screen result due to possible interfering substances. A more specific alternate chemical method must be used in order to obtain a confirmed analytical result. Gas chromatography / mass spectrometry (GC/MS) is the preferred confirm atory method. Performed at [facility], [address] [place], [place] [address]   Troponin I (High Sensitivity)     Status: None   Collection Time:  01/14/22  5:52 PM  Result Value Ref Range   Troponin I (High Sensitivity) 3 <18 ng/L    Comment: (NOTE) Elevated high sensitivity troponin I (hsTnI) values and significant  changes across serial measurements may suggest ACS but many other  chronic and acute conditions are known to elevate hsTnI results.  Refer to the "Links" section for chest pain algorithms and additional  guidance. Performed at [facility], [address] [place], [place] [address]   CBC w/Diff     Status: Abnormal   Collection Time: 01/17/22  1:36 PM  Result Value Ref Range   WBC 10.3 3.4 - 10.8 x10E3/uL   RBC 4.45 4.14 - 5.80 x10E6/uL   Hemoglobin 13.9 13.0 - 17.7 g/dL   Hematocrit 41.4 37.5 - 51.0 %   MCV 93 79 - 97 fL   MCH 31.2 26.6 - 33.0 pg   MCHC 33.6 31.5 - 35.7 g/dL   RDW 12.7 11.6 - 15.4 %   Platelets 288 150 - 450 x10E3/uL   Neutrophils 74 Not Estab. %   Lymphs 17 Not Estab. %   Monocytes 5 Not Estab. %   Eos 3 Not Estab. %   Basos 1 Not Estab. %   Neutrophils Absolute 7.7 (H) 1.4 - 7.0 x10E3/uL   Lymphocytes  Absolute 1.7 0.7 - 3.1 x10E3/uL   Monocytes Absolute 0.5 0.1 - 0.9 x10E3/uL   EOS (ABSOLUTE) 0.3 0.0 - 0.4 x10E3/uL   Basophils Absolute 0.1 0.0 - 0.2 x10E3/uL   Immature Granulocytes 0 Not Estab. %   Immature Grans (Abs) 0.0 0.0 - 0.1 x10E3/uL  Comp Met (CMET)     Status: Abnormal   Collection Time: 01/17/22  1:36 PM  Result Value Ref Range   Glucose 119 (H) 70 - 99 mg/dL   BUN 9 8 - 27 mg/dL   Creatinine, Ser 0.81 0.76 - 1.27 mg/dL   eGFR 98 >59 mL/min/1.73   BUN/Creatinine Ratio 11 10 - 24   Sodium 141 134 - 144 mmol/L   Potassium 4.2 3.5 - 5.2 mmol/L   Chloride 103 96 - 106 mmol/L   CO2 23 20 - 29 mmol/L   Calcium 9.5 8.6 - 10.2 mg/dL   Total Protein 7.3 6.0 - 8.5 g/dL   Albumin 4.4 3.9 - 4.9 g/dL   Globulin, Total 2.9 1.5 - 4.5 g/dL   Albumin/Globulin Ratio 1.5 1.2 - 2.2   Bilirubin Total 0.6 0.0 - 1.2 mg/dL   Alkaline Phosphatase 62 44 - 121 IU/L   AST 16 0 - 40 IU/L   ALT 13 0 - 44 IU/L  HgB A1c     Status: Abnormal   Collection Time: 01/17/22  1:36 PM  Result Value Ref Range   Hgb A1c MFr Bld 6.3 (H) 4.8 - 5.6 %    Comment:          Prediabetes: 5.7 - 6.4          Diabetes: >6.4          Glycemic control for adults with diabetes: <7.0    Est. average glucose Bld gHb Est-mCnc 134 mg/dL  TSH     Status: None   Collection Time: 01/17/22  1:36 PM  Result Value Ref Range   TSH 1.390 0.450 - 4.500 uIU/mL      Constitutional:  Wt 166 lb (75.3 kg)   BMI 25.24 kg/m    Musculoskeletal: Strength & Muscle Tone: within normal limits Gait & Station: normal Patient leans: N/A  Psychiatric Specialty Exam: Physical Exam  ROS  Weight 166 lb (75.3 kg).There is no height or weight on file to calculate BMI.  General Appearance: Casual  Eye Contact:  Fair  Speech:  Slow  Volume:  Decreased  Mood:  Anxious and emotional, easily tearful  Affect:  Constricted and Depressed  Thought Process:  Descriptions of Associations: Intact  Orientation:  Full (Time, Place, and  Person)  Thought Content:  Rumination  Suicidal Thoughts:  No  Homicidal Thoughts:  No  Memory:  Immediate;   Good Recent;   Good Remote;   Good  Judgement:  Intact  Insight:  Present  Psychomotor Activity:  Increased and rocking  Concentration:  Concentration: Fair and Attention Span: Fair  Recall:  [employer] of Knowledge:  Good  Language:  Good  Akathisia:  No  Handed:  Right  AIMS (if indicated):     Assets:  Communication Skills Desire for Improvement Housing Talents/Skills Transportation  ADL's:  Intact  Cognition:  WNL  Sleep:        Assessment/Plan:  Patient is 66 year old married, employed man with history of anxiety, panic attacks, depression, PTSD, cannabis use referred to [place] from PCP for management of his psychiatric symptoms.  Psychosocial stressors, current medication, medical history and blood work results.  He has hypertension, episodes of dizziness and hyperlipidemia.  He also lost 9 pounds in the past 2 months due to decreased appetite and feeling very nervous and anxious.  I talked to him about his underlying diagnosis with the possibility of PTSD, depression, anxiety, panic attacks and cannabis use.  Currently he is taking Xanax prescribed by PCP.  We talk about benzodiazepine dependence tolerance and withdrawal.  Recommend to try to take with different medicine that can help his symptoms long-term.  Patient agreed to give a try.  He does not want any medication that cause sexual side effects.  Recommend to try low-dose mirtazapine 15 mg to help his insomnia, anxiety, depression.  We will also help his appetite.  We discussed medication side effects in detail.  I also encouraged him to stop smoking marijuana and consider therapy to help his coping skills.  Patient agreed and we will refer him to see a therapist.  We discussed safety concern that anytime having active suicidal thoughts or homicidal thoughts and need to call 911 or go to local emergency room.  Follow-up  in 3 to 4 weeks.  I will forward my note to his PCP.  Kathlee Nations, MD 04/07/2022    Follow Up Instructions: I discussed the assessment and treatment plan with the patient. The patient was provided an opportunity to ask questions and all were answered. The patient agreed with the plan and demonstrated an understanding of the instructions.   The patient was advised to call back or seek an in-person evaluation if the symptoms worsen or if the condition fails to improve as anticipated.   Collaboration of Care: Primary Care Provider AEB notes are available in epic to review.   Patient/Guardian was advised Release of Information must be obtained prior to any record release in order to collaborate their care with an outside provider. Patient/Guardian was advised if they have not already done so to contact the registration department to sign all necessary forms in order for [place] to release information regarding their care.    Consent: Patient/Guardian gives verbal consent for treatment and assignment of benefits for services provided during this visit. Patient/Guardian expressed understanding and agreed to proceed.  I provided 72 minutes of non-face-to-face time during this encounter.

## 2022-04-25 DIAGNOSIS — I252 Old myocardial infarction: Secondary | ICD-10-CM | POA: Diagnosis not present

## 2022-04-25 DIAGNOSIS — R69 Illness, unspecified: Secondary | ICD-10-CM | POA: Diagnosis not present

## 2022-04-25 DIAGNOSIS — Z818 Family history of other mental and behavioral disorders: Secondary | ICD-10-CM | POA: Diagnosis not present

## 2022-04-25 DIAGNOSIS — N529 Male erectile dysfunction, unspecified: Secondary | ICD-10-CM | POA: Diagnosis not present

## 2022-04-25 DIAGNOSIS — Z9181 History of falling: Secondary | ICD-10-CM | POA: Diagnosis not present

## 2022-04-25 DIAGNOSIS — Z803 Family history of malignant neoplasm of breast: Secondary | ICD-10-CM | POA: Diagnosis not present

## 2022-04-25 DIAGNOSIS — I10 Essential (primary) hypertension: Secondary | ICD-10-CM | POA: Diagnosis not present

## 2022-04-25 DIAGNOSIS — Z87891 Personal history of nicotine dependence: Secondary | ICD-10-CM | POA: Diagnosis not present

## 2022-04-25 DIAGNOSIS — E785 Hyperlipidemia, unspecified: Secondary | ICD-10-CM | POA: Diagnosis not present

## 2022-04-25 DIAGNOSIS — K219 Gastro-esophageal reflux disease without esophagitis: Secondary | ICD-10-CM | POA: Diagnosis not present

## 2022-04-25 DIAGNOSIS — Z8249 Family history of ischemic heart disease and other diseases of the circulatory system: Secondary | ICD-10-CM | POA: Diagnosis not present

## 2022-05-02 ENCOUNTER — Telehealth (HOSPITAL_BASED_OUTPATIENT_CLINIC_OR_DEPARTMENT_OTHER): Payer: 59 | Admitting: Psychiatry

## 2022-05-02 ENCOUNTER — Encounter (HOSPITAL_COMMUNITY): Payer: Self-pay | Admitting: Psychiatry

## 2022-05-02 DIAGNOSIS — F410 Panic disorder [episodic paroxysmal anxiety] without agoraphobia: Secondary | ICD-10-CM | POA: Diagnosis not present

## 2022-05-02 DIAGNOSIS — R69 Illness, unspecified: Secondary | ICD-10-CM | POA: Diagnosis not present

## 2022-05-02 DIAGNOSIS — F4312 Post-traumatic stress disorder, chronic: Secondary | ICD-10-CM

## 2022-05-02 DIAGNOSIS — F331 Major depressive disorder, recurrent, moderate: Secondary | ICD-10-CM

## 2022-05-02 DIAGNOSIS — F1210 Cannabis abuse, uncomplicated: Secondary | ICD-10-CM | POA: Diagnosis not present

## 2022-05-02 MED ORDER — MIRTAZAPINE 15 MG PO TABS: 15.0000 mg | ORAL_TABLET | Freq: Every day | ORAL | 2 refills | Status: DC
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
## 2022-05-16 ENCOUNTER — Ambulatory Visit (INDEPENDENT_AMBULATORY_CARE_PROVIDER_SITE_OTHER): Payer: 59 | Admitting: Licensed Clinical Social Worker

## 2022-05-16 DIAGNOSIS — F410 Panic disorder [episodic paroxysmal anxiety] without agoraphobia: Secondary | ICD-10-CM | POA: Diagnosis not present

## 2022-05-16 DIAGNOSIS — R69 Illness, unspecified: Secondary | ICD-10-CM | POA: Diagnosis not present

## 2022-05-16 NOTE — Progress Notes (Deleted)
Mirtazapine sleep better and not so emotional now Feel like completely normal Gained 8 pounds  Two cups of coffee in the a.m. Knock em Dead Don't panic Hobbies  Pay attention to emotions Don't stop meds
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
## 2022-07-03 ENCOUNTER — Other Ambulatory Visit (HOSPITAL_COMMUNITY): Payer: Self-pay | Admitting: *Deleted

## 2022-07-03 ENCOUNTER — Telehealth (HOSPITAL_BASED_OUTPATIENT_CLINIC_OR_DEPARTMENT_OTHER): Payer: Self-pay | Admitting: Psychiatry

## 2022-07-03 ENCOUNTER — Encounter (HOSPITAL_COMMUNITY): Payer: Self-pay | Admitting: Psychiatry

## 2022-07-03 DIAGNOSIS — F331 Major depressive disorder, recurrent, moderate: Secondary | ICD-10-CM

## 2022-07-03 DIAGNOSIS — F410 Panic disorder [episodic paroxysmal anxiety] without agoraphobia: Secondary | ICD-10-CM

## 2022-07-03 DIAGNOSIS — F4312 Post-traumatic stress disorder, chronic: Secondary | ICD-10-CM

## 2022-07-03 DIAGNOSIS — F1210 Cannabis abuse, uncomplicated: Secondary | ICD-10-CM

## 2022-07-03 MED ORDER — MIRTAZAPINE 15 MG PO TABS: 15.0000 mg | ORAL_TABLET | Freq: Every day | ORAL | 2 refills | Status: DC

## 2022-07-03 NOTE — Progress Notes (Signed)
Virtual Visit via Video Note  I connected with Douglas Barnes on 07/03/22 at  1:00 PM EST by a video enabled telemedicine application and verified that I am speaking with the correct person using two identifiers.  Location: Patient: In Car Provider: Home Office   I discussed the limitations of evaluation and management by telemedicine and the availability of in person appointments. The patient expressed understanding and agreed to proceed.  History of Present Illness: Patient is evaluated by video session.  He is taking mirtazapine at bedtime.  He sleeps good and denies any crying spells, anxiety attack, panic attack, nightmares or flashback.  His appetite is good.  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  He had a good holidays.  He was pleased that his grandson spent 5 to 6 hours on his birthday.  He had 2 visits with Aldean Jewett and decided that he does not need to see him anymore since things are going very well.  He has no tremors, shakes or any EPS.  He denies any feeling of hopelessness or worthlessness.  Past Psychiatric History: History of anxiety, depression and PTSD.  Never seen psychiatrist before but given Wellbutrin and Prozac by PCP in the past.  He recalled side effects and never tried long enough.  His PCP prescribed Xanax.  No history of suicidal attempt, inpatient treatment, psychosis, mania.  History of addiction with pain medications.  Patient did 14 days rehab at [facility] in 2002.    Psychiatric Specialty Exam: Physical Exam  Review of Systems  Weight 170 lb (77.1 kg).There is no height or weight on file to calculate BMI.  General Appearance: Casual  Eye Contact:  Good  Speech:  Clear and Coherent and Normal Rate  Volume:  Normal  Mood:  Euthymic  Affect:  Appropriate  Thought Process:  Goal Directed  Orientation:  Full (Time, Place, and Person)  Thought Content:  WDL and Logical  Suicidal Thoughts:  No  Homicidal Thoughts:  No   Memory:  Immediate;   Good Recent;   Good Remote;   Good  Judgement:  Good  Insight:  Good  Psychomotor Activity:  Normal  Concentration:  Concentration: Good and Attention Span: Good  Recall:  Good  Fund of Knowledge:  Good  Language:  Good  Akathisia:  No  Handed:  Right  AIMS (if indicated):     Assets:  Communication Skills Desire for Improvement Housing Resilience Social Support Transportation  ADL's:  Intact  Cognition:  WNL  Sleep:   ok      Assessment and Plan: Chronic PTSD.  Major depressive disorder, recurrent.  Panic attacks.  Patient doing well on current dose of mirtazapine 15 mg at bedtime.  He had stopped therapy since he does not need an not smoking marijuana.  Recently he had a Medicare and like to send his prescription to the [facility] pharmacy.  Patient do not have the details at this time but like to call us back to provide information.  We will continue mirtazapine 15 mg at bedtime.  Recommend to call us back if is any question or any concern.  Follow-up in 3 months.  He will require a 90-day supply.  Follow Up Instructions:    I discussed the assessment and treatment plan with the patient. The patient was provided an opportunity to ask questions and all were answered. The patient agreed with the plan and demonstrated an understanding of the instructions.   The patient was  advised to call back or seek an in-person evaluation if the symptoms worsen or if the condition fails to improve as anticipated.  Collaboration of Care: Other provider involved in patient's care AEB notes are available in epic to review.  Patient/Guardian was advised Release of Information must be obtained prior to any record release in order to collaborate their care with an outside provider. Patient/Guardian was advised if they have not already done so to contact the registration department to sign all necessary forms in order for [place] to release information regarding their care.    Consent: Patient/Guardian gives verbal consent for treatment and assignment of benefits for services provided during this visit. Patient/Guardian expressed understanding and agreed to proceed.    I provided 18 minutes of non-face-to-face time during this encounter.   Kathlee Nations, MD

## 2022-07-18 ENCOUNTER — Other Ambulatory Visit: Payer: Self-pay | Admitting: Urology

## 2022-07-18 DIAGNOSIS — C61 Malignant neoplasm of prostate: Secondary | ICD-10-CM

## 2022-08-18 ENCOUNTER — Telehealth (HOSPITAL_COMMUNITY): Payer: Self-pay | Admitting: *Deleted

## 2022-08-18 NOTE — Telephone Encounter (Signed)
It was send last month with two refills. He is not due until his appointment. Please check the encounter.

## 2022-08-18 NOTE — Telephone Encounter (Signed)
Please call patient and verified about his refills? Thanks

## 2022-08-18 NOTE — Telephone Encounter (Signed)
Rx sending Refill Request--- [place]  mirtazapine (REMERON) 15 MG tablet   Last appt  07/03/22 Next appt  10/02/22

## 2022-08-18 NOTE — Telephone Encounter (Signed)
[place], [place] 2 RF  Filled on ::  1st on 07/23/22  scheduled delivery for 08/21/22

## 2022-08-21 ENCOUNTER — Encounter: Payer: Self-pay | Admitting: Family Medicine

## 2022-08-21 ENCOUNTER — Ambulatory Visit (INDEPENDENT_AMBULATORY_CARE_PROVIDER_SITE_OTHER): Payer: Self-pay | Admitting: Family Medicine

## 2022-08-21 VITALS — BP 110/71 | HR 82 | Ht 68.0 in | Wt 176.0 lb

## 2022-08-21 DIAGNOSIS — R7303 Prediabetes: Secondary | ICD-10-CM

## 2022-08-21 DIAGNOSIS — E7800 Pure hypercholesterolemia, unspecified: Secondary | ICD-10-CM

## 2022-08-21 DIAGNOSIS — E786 Lipoprotein deficiency: Secondary | ICD-10-CM

## 2022-08-21 DIAGNOSIS — K219 Gastro-esophageal reflux disease without esophagitis: Secondary | ICD-10-CM

## 2022-08-21 DIAGNOSIS — I10 Essential (primary) hypertension: Secondary | ICD-10-CM

## 2022-08-21 LAB — POCT GLYCOSYLATED HEMOGLOBIN (HGB A1C): Hemoglobin A1C: 6.1 % — AB (ref 4.0–5.6)

## 2022-08-21 MED ORDER — OMEPRAZOLE 20 MG PO CPDR: DELAYED_RELEASE_CAPSULE | ORAL | 3 refills | Status: DC

## 2022-08-21 MED ORDER — LOSARTAN POTASSIUM-HCTZ 100-25 MG PO TABS: 1.0000 | ORAL_TABLET | Freq: Every day | ORAL | 3 refills | Status: DC

## 2022-08-21 MED ORDER — ATORVASTATIN CALCIUM 80 MG PO TABS: 80.0000 mg | ORAL_TABLET | Freq: Every day | ORAL | 3 refills | Status: DC

## 2022-08-21 NOTE — Progress Notes (Signed)
   Established Patient Office Visit  Subjective   Patient ID: Douglas Barnes, male    DOB: 10-15-1955  Age: 67 y.o. MRN: [id number]  Chief Complaint  Patient presents with   Hypertension   Depression    Pt here for regular follow up. Needs refills on his medications.  No issues with medications. Does not take xanax anymore.  States he thinks his anxiety issues were related to work and lack of sleep.  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  Now is mostly retired but works part time at [employer].  Says he started gaining weight after taking the remeron for sleep.  Is okay with checking his A1c today.        ROS    Objective:     BP 110/71   Pulse 82   Ht 5\' 8"  (1.727 m)   Wt 176 lb (79.8 kg)   SpO2 98% Comment: on RA  BMI 26.76 kg/m    Physical Exam Constitutional:      Appearance: He is normal weight.  Cardiovascular:     Rate and Rhythm: Normal rate.     Pulses: Normal pulses.  Pulmonary:     Effort: Pulmonary effort is normal.  Neurological:     Mental Status: He is alert.      No results found for any visits on 08/21/22.    The ASCVD Risk score (Arnett DK, et al., 2019) failed to calculate for the following reasons:   The valid total cholesterol range is 130 to 320 mg/dL    Assessment & Plan:   Problem List Items Addressed This Visit       Cardiovascular and Mediastinum   HTN (hypertension) (Chronic)    Well controlled today.   - refill arb-hctz      Relevant Medications   atorvastatin (LIPITOR) 80 MG tablet   losartan-hydrochlorothiazide (HYZAAR) 100-25 MG tablet     Digestive   GERD (gastroesophageal reflux disease) (Chronic)   Relevant Medications   omeprazole (PRILOSEC) 20 MG capsule     Other   Hyperlipidemia    Refill atorvastatin      Relevant Medications   atorvastatin (LIPITOR) 80 MG tablet   losartan-hydrochlorothiazide (HYZAAR) 100-25 MG tablet   Low HDL (under 40)   Relevant Medications    atorvastatin (LIPITOR) 80 MG tablet   Prediabetes - Primary    Check a1c      Relevant Orders   POCT HgB A1C   Other Visit Diagnoses     Essential hypertension       Relevant Medications   atorvastatin (LIPITOR) 80 MG tablet   losartan-hydrochlorothiazide (HYZAAR) 100-25 MG tablet       Return in about 6 months (around 02/21/2023) for HTN.    Benay Pike, MD

## 2022-08-21 NOTE — Assessment & Plan Note (Signed)
Check a1c 

## 2022-08-21 NOTE — Telephone Encounter (Signed)
called patient to verify  his refills -- had to LVM

## 2022-08-21 NOTE — Assessment & Plan Note (Signed)
Refill atorvastatin

## 2022-08-21 NOTE — Patient Instructions (Signed)
It was nice to meet you today,   I have prescribed your medications.  I have ordered a check of your prediabetes.  We will let you know the results when we get them.    Have a great day,   Dr. Jeannine Kitten

## 2022-08-21 NOTE — Assessment & Plan Note (Signed)
Well controlled today.   - refill arb-hctz

## 2022-10-01 ENCOUNTER — Encounter: Payer: Self-pay | Admitting: Family Medicine

## 2022-10-02 ENCOUNTER — Telehealth (HOSPITAL_COMMUNITY): Payer: Self-pay | Admitting: Psychiatry

## 2022-12-16 ENCOUNTER — Other Ambulatory Visit (HOSPITAL_BASED_OUTPATIENT_CLINIC_OR_DEPARTMENT_OTHER): Payer: Self-pay | Admitting: Family Medicine

## 2022-12-16 MED ORDER — ALPRAZOLAM 0.5 MG PO TABS: 0.5000 mg | ORAL_TABLET | Freq: Two times a day (BID) | ORAL | 0 refills | Status: DC | PRN

## 2022-12-16 NOTE — Progress Notes (Signed)
Received after-hours page for patient requesting prescription for Xanax.  He reports that he had death in the family and he may be flying out on a short notice today.  Reportedly has someone he knows that works for [employer] that is working to [employer] for patient.  He has taken Xanax in the past and is requesting this medication be prescribed again to utilize for his fear of flying.  Review of chart does indicate that he has utilized Xanax previously, was prescribed by PCP previously.  He does follow with psychiatrist currently for management of depression, anxiety, panic attacks. Currently not taking any benzodiazepines otherwise.  Can allow for limited quantity refill of Xanax to be utilized if patient is able to obtain flight.

## 2023-01-12 ENCOUNTER — Ambulatory Visit
Admission: EM | Admit: 2023-01-12 | Discharge: 2023-01-12 | Disposition: A | Discharge status: Home / Self Care | Payer: Medicare Other | Attending: Internal Medicine | Admitting: Internal Medicine

## 2023-01-12 DIAGNOSIS — U071 COVID-19: Secondary | ICD-10-CM | POA: Diagnosis not present

## 2023-01-12 MED ORDER — BENZONATATE 100 MG PO CAPS: 100.0000 mg | ORAL_CAPSULE | Freq: Three times a day (TID) | ORAL | 0 refills | Status: DC

## 2023-01-12 MED ORDER — PAXLOVID (300/100) 20 X 150 MG & 10 X 100MG PO TBPK: 3.0000 | ORAL_TABLET | Freq: Two times a day (BID) | ORAL | 0 refills | Status: AC

## 2023-01-12 MED ORDER — PROMETHAZINE-DM 6.25-15 MG/5ML PO SYRP: 5.0000 mL | ORAL_SOLUTION | Freq: Every evening | ORAL | 0 refills | Status: AC | PRN

## 2023-01-12 NOTE — ED Triage Notes (Signed)
"  I tested + for COVID19 about 1 hour ago, symptoms started about 3 days ago with Fever and Cough". Requesting Rx.

## 2023-01-12 NOTE — Discharge Instructions (Signed)
Your symptoms are most likely due to a viral illness, which will improve on its own with rest and fluids. Wear a mask for 5 days of symptoms. You may return to public within those 5 days as long as you do not have a fever. Wash hands frequently.  Take paxlovid as prescribed. Start medicine tonight. The sooner the paxlovid is started, the better it works in Public relations account executive.  Do not take your atorvastatin while you are taking paxlovid as these medicines are not friends.   - Take prescribed medicines to help with symptoms: tessalon perles, promethazine DM (drowsiness precautions with cough syrup, only take at bedtime) - Use over the counter medicines to help with symptoms as discussed: Tylenol, guaifenesin (mucinex), zyrtec, etc - Two teaspoons of honey in warm water every 4-6 hours may help with throat pains - Humidifier in your room at night to help add water the air and soothe cough  If you develop any new or worsening symptoms or do not improve in the next 2 to 3 days, please return.  If your symptoms are severe, please go to the emergency room.  Follow-up with PCP as needed.

## 2023-01-12 NOTE — ED Provider Notes (Signed)
[facility]    CSN: [id number] Arrival date & time: 01/12/23  1834      History   Chief Complaint Chief Complaint  Patient presents with   COVID19    + @ home Test.     HPI Douglas Barnes is a 67 y.o. male.   Patient presents to urgent care for evaluation of cough, nasal congestion, headache, body aches, and generalized fatigue that started 2 days ago on January 09, 2023.  COVID-19 testing positive at home.  Cough is sometimes dry and sometimes productive.  Headache is of the frontal aspect of the head bilaterally and currently 8 out of 10.  Headache described as a throbbing sensation.  Reports bilateral chest discomfort associated with cough, no shortness of breath, heart palpitations, nausea, vomiting, diarrhea, abdominal pain, rash, or dizziness.  Max temp at home was 101 but responded well to Tylenol.  Currently afebrile.  Former smoker, denies drug use.  Denies history of chronic respiratory problems.  Taking Mucinex, Tylenol, and Alka-Seltzer plus at home with some relief of symptoms.     Past Medical History:  Diagnosis Date   BPH (benign prostatic hypertrophy) with urinary retention    Complication of anesthesia    "fear of not waking up"   Foley catheter in place    Hepatitis C antibody test positive    dx 1997--  pt is getting treatment in near future (last enzymes 12 /2016 stable per pt and in epic)   History of colon polyps    History of hypertension    no medication since 2014 lost weight and decrease stress   Hypertension     Patient Active Problem List   Diagnosis Date Noted   Gallstones 07/26/2021   Numbness 03/14/2019   Anxiety 03/14/2019   On statin therapy 03/14/2019   Prediabetes 03/13/2018   Localized swelling, mass or lump of neck 09/20/2017   GERD (gastroesophageal reflux disease) 05/31/2017   Hyperlipidemia 05/31/2017   Low HDL (under 40) 05/31/2017   Fatigue 05/31/2017   Vitamin D deficiency 05/31/2017   Noncompliance with  diet and medication regimen 05/31/2017   Obese abdomen 05/30/2016   Poor diet 05/30/2016   Inactivity- not working out and no cardio 05/30/2016   HTN (hypertension) 02/23/2016   History of tobacco use 02/23/2016   Hepatitis C infection- s/p harvoni 02/23/2016   History of colonic polyps 02/23/2016   Hematuria- chronic (followed by Dr Mena Goes urology) 07/11/2015   Benign prostatic hyperplasia with lower urinary tract symptoms 07/09/2015   DDD (degenerative disc disease), lumbar 12/01/2013   Lumbar radiculitis 12/01/2013    Past Surgical History:  Procedure Laterality Date   ANAL FISSURE REPAIR     COLONOSCOPY WITH PROPOFOL  last one 2015   GREEN LIGHT LASER TURP (TRANSURETHRAL RESECTION OF PROSTATE N/A 07/09/2015   Procedure: GREEN LIGHT LASER TURP (TRANSURETHRAL RESECTION OF PROSTATE;  Surgeon: Jerilee Field, MD;  Location: [facility];  Service: Urology;  Laterality: N/A;   INGUINAL HERNIA REPAIR Right age 53   TRANSURETHRAL RESECTION OF PROSTATE N/A 07/09/2015   Procedure: CYSTOSCOPY WITH CLOT EVACUATION //FULGERATION OF BLEEDERS;  Surgeon: Sebastian Ache, MD;  Location: [facility];  Service: Urology;  Laterality: N/A;       Home Medications    Prior to Admission medications   Medication Sig Start Date End Date Taking? Authorizing Provider  atorvastatin (LIPITOR) 80 MG tablet Take 1 tablet (80 mg total) by mouth daily. 08/21/22 08/16/23 Yes Sandre Kitty, MD  benzonatate (TESSALON) 100 MG capsule Take 1 capsule (100 mg total) by mouth every 8 (eight) hours. 01/12/23  Yes Carlisle Beers, FNP  finasteride (PROSCAR) 5 MG tablet Take 5 mg by mouth daily. 02/17/18  Yes [provider]  losartan-hydrochlorothiazide (HYZAAR) 100-25 MG tablet Take 1 tablet by mouth daily. 08/21/22  Yes Sandre Kitty, MD  mirtazapine (REMERON) 15 MG tablet Take 1 tablet (15 mg total) by mouth at bedtime. 07/03/22 07/03/23 Yes Arfeen, Phillips Grout, MD  nirmatrelvir & ritonavir  (PAXLOVID, 300/100,) 20 x 150 MG & 10 x 100MG  TBPK Take 3 tablets by mouth 2 (two) times daily for 5 days. 01/12/23 01/17/23 Yes Carlisle Beers, FNP  omeprazole (PRILOSEC) 20 MG capsule TAKE ONE CAPSULE BY MOUTH AT BEDTIME 08/21/22  Yes Sandre Kitty, MD  promethazine-dextromethorphan (PROMETHAZINE-DM) 6.25-15 MG/5ML syrup Take 5 mLs by mouth at bedtime as needed for cough. 01/12/23  Yes Carlisle Beers, FNP  Acetaminophen 500 MG capsule Take by mouth.    [provider]  ALPRAZolam Prudy Feeler) 0.5 MG tablet Take 1 tablet (0.5 mg total) by mouth 2 (two) times daily as needed for anxiety. 12/16/22   de [place], Buren Kos, MD  ibuprofen (ADVIL) 200 MG tablet 4-6 tablets    [provider]  sildenafil (VIAGRA) 100 MG tablet 1/2 TABLET EVERY DAY AS NEEDED 02/17/21   Mayer Masker, PA-C    Family History Family History  Problem Relation Age of Onset   Cancer Mother 33       BONE   Cancer Father 68       COLON   Hypertension Brother    Heart disease Brother    Heart disease Brother    Hypertension Brother     Social History Social History   Tobacco Use   Smoking status: Former    Current packs/day: 0.00    Average packs/day: 0.3 packs/day for 25.0 years (6.3 ttl pk-yrs)    Types: Cigarettes    Start date: 04/02/1989    Quit date: 04/02/2014    Years since quitting: 8.7   Smokeless tobacco: Never  Vaping Use   Vaping status: Never Used  Substance Use Topics   Alcohol use: No   Drug use: Not Currently    Types: Marijuana     Allergies   Patient has no known allergies.   Review of Systems Review of Systems Per HPI  Physical Exam Triage Vital Signs ED Triage Vitals  Encounter Vitals Group     BP 01/12/23 1842 131/85     Systolic BP Percentile --      Diastolic BP Percentile --      Pulse Rate 01/12/23 1842 (!) 102     Resp 01/12/23 1842 20     Temp 01/12/23 1842 99.1 F (37.3 C)     Temp Source 01/12/23 1842 Oral     SpO2 01/12/23 1842 96 %      Weight 01/12/23 1840 178 lb (80.7 kg)     Height 01/12/23 1840 5\' 8"  (1.727 m)     Head Circumference --      Peak Flow --      Pain Score 01/12/23 1838 0     Pain Loc --      Pain Education --      Exclude from Growth Chart --    No data found.  Updated Vital Signs BP 131/85 (BP Location: Left Arm)   Pulse 100   Temp 99.1 F (37.3 C) (Oral)  Resp 20   Ht 5\' 8"  (1.727 m)   Wt 178 lb (80.7 kg)   SpO2 96%   BMI 27.06 kg/m   Visual Acuity Right Eye Distance:   Left Eye Distance:   Bilateral Distance:    Right Eye Near:   Left Eye Near:    Bilateral Near:     Physical Exam Vitals and nursing note reviewed.  Constitutional:      Appearance: He is not ill-appearing or toxic-appearing.  HENT:     Head: Normocephalic and atraumatic.     Right Ear: Hearing, tympanic membrane, ear canal and external ear normal.     Left Ear: Hearing, tympanic membrane, ear canal and external ear normal.     Nose: Congestion present.     Mouth/Throat:     Lips: Pink.     Mouth: Mucous membranes are moist. No injury.     Tongue: No lesions. Tongue does not deviate from midline.     Palate: No mass and lesions.     Pharynx: Oropharynx is clear. Uvula midline. No pharyngeal swelling, oropharyngeal exudate, posterior oropharyngeal erythema or uvula swelling.     Tonsils: No tonsillar exudate or tonsillar abscesses.  Eyes:     General: Lids are normal. Vision grossly intact. Gaze aligned appropriately.     Extraocular Movements: Extraocular movements intact.     Conjunctiva/sclera: Conjunctivae normal.  Cardiovascular:     Rate and Rhythm: Normal rate and regular rhythm.     Heart sounds: Normal heart sounds, S1 normal and S2 normal.  Pulmonary:     Effort: Pulmonary effort is normal. No respiratory distress.     Breath sounds: Normal breath sounds and air entry. No wheezing, rhonchi or rales.  Chest:     Chest wall: No tenderness.  Musculoskeletal:     Cervical back: Neck supple.      Right lower leg: No edema.     Left lower leg: No edema.  Lymphadenopathy:     Cervical: No cervical adenopathy.  Skin:    General: Skin is warm and dry.     Capillary Refill: Capillary refill takes less than 2 seconds.     Findings: No rash.  Neurological:     General: No focal deficit present.     Mental Status: He is alert and oriented to person, place, and time. Mental status is at baseline.     Cranial Nerves: No dysarthria or facial asymmetry.  Psychiatric:        Mood and Affect: Mood normal.        Speech: Speech normal.        Behavior: Behavior normal.        Thought Content: Thought content normal.        Judgment: Judgment normal.      UC Treatments / Results  Labs (all labs ordered are listed, but only abnormal results are displayed) Labs Reviewed - No data to display  EKG   Radiology No results found.  Procedures Procedures (including critical care time)  Medications Ordered in UC Medications - No data to display  Initial Impression / Assessment and Plan / UC Course  I have reviewed the triage vital signs and the nursing notes.  Pertinent labs & imaging results that were available during my care of the patient were reviewed by me and considered in my medical decision making (see chart for details).   1.  COVID-19 Symptomatic treatment with rest and adequate fluid intake. May use OTC medications as  needed for symptomatic relief. Prescriptions for further symptomatic relief sent: Tessalon Perles and Promethazine DM Lungs clear, therefore deferred imaging.  Patient is a candidate for antiviral.  Paxlovid sent to pharmacy, GFR greater than 60 based on CMP from 2023.  Advised to stop atorvastatin and Viagra during duration of Paxlovid course.  May resume taking medications after Paxlovid is finished. Discussed most up to date CDC guidelines regarding quarantine and masking to prevent transmission.  Work/school excuse note given.  Counseled patient on  potential for adverse effects with medications prescribed/recommended today, strict ER and return-to-clinic precautions discussed, patient verbalized understanding.   Final Clinical Impressions(s) / UC Diagnoses   Final diagnoses:  COVID-19     Discharge Instructions      Your symptoms are most likely due to a viral illness, which will improve on its own with rest and fluids. Wear a mask for 5 days of symptoms. You may return to public within those 5 days as long as you do not have a fever. Wash hands frequently.  Take paxlovid as prescribed. Start medicine tonight. The sooner the paxlovid is started, the better it works in Public relations account executive.  Do not take your atorvastatin while you are taking paxlovid as these medicines are not friends.   - Take prescribed medicines to help with symptoms: tessalon perles, promethazine DM (drowsiness precautions with cough syrup, only take at bedtime) - Use over the counter medicines to help with symptoms as discussed: Tylenol, guaifenesin (mucinex), zyrtec, etc - Two teaspoons of honey in warm water every 4-6 hours may help with throat pains - Humidifier in your room at night to help add water the air and soothe cough  If you develop any new or worsening symptoms or do not improve in the next 2 to 3 days, please return.  If your symptoms are severe, please go to the emergency room.  Follow-up with PCP as needed.      ED Prescriptions     Medication Sig Dispense Auth. Provider   benzonatate (TESSALON) 100 MG capsule Take 1 capsule (100 mg total) by mouth every 8 (eight) hours. 21 capsule Carlisle Beers, FNP   promethazine-dextromethorphan (PROMETHAZINE-DM) 6.25-15 MG/5ML syrup Take 5 mLs by mouth at bedtime as needed for cough. 118 mL Carlisle Beers, FNP   nirmatrelvir & ritonavir (PAXLOVID, 300/100,) 20 x 150 MG & 10 x 100MG  TBPK Take 3 tablets by mouth 2 (two) times daily for 5 days. 30 tablet Carlisle Beers, FNP      PDMP not  reviewed this encounter.   Carlisle Beers, [place] 01/12/23 2044

## 2023-02-12 ENCOUNTER — Ambulatory Visit: Payer: Medicare Other | Admitting: Family Medicine

## 2023-02-12 NOTE — Progress Notes (Deleted)
Established Patient Office Visit  Subjective   Patient ID: Douglas Barnes, male    DOB: Nov 30, 1955  Age: 67 y.o. MRN: [id number]  No chief complaint on file.   HPI RLQ abd pain - gallstones? Last colnooscopy? Dr. Dulce Sellar notes.    The ASCVD Risk score (Arnett DK, et al., 2019) failed to calculate for the following reasons:   The valid total cholesterol range is 130 to 320 mg/dL  Health Maintenance Due  Topic Date Due   Medicare Annual Wellness (AWV)  Never done   HIV Screening  Never done   Zoster Vaccines- Shingrix (1 of 2) Never done   COVID-19 Vaccine (3 - Pfizer risk series) 09/26/2019   Pneumonia Vaccine 32+ Years old (1 of 1 - PCV) Never done   INFLUENZA VACCINE  12/28/2022      Objective:     There were no vitals taken for this visit. {Vitals History (Optional):23777}  Physical Exam   No results found for any visits on 02/12/23.      Assessment & Plan:   There are no diagnoses linked to this encounter.   No follow-ups on file.    Sandre Kitty, MD

## 2023-04-20 ENCOUNTER — Other Ambulatory Visit: Payer: Self-pay | Admitting: Family Medicine

## 2023-04-20 DIAGNOSIS — E786 Lipoprotein deficiency: Secondary | ICD-10-CM

## 2023-04-20 DIAGNOSIS — K219 Gastro-esophageal reflux disease without esophagitis: Secondary | ICD-10-CM

## 2023-04-20 DIAGNOSIS — E7800 Pure hypercholesterolemia, unspecified: Secondary | ICD-10-CM

## 2023-04-20 DIAGNOSIS — I10 Essential (primary) hypertension: Secondary | ICD-10-CM

## 2023-06-16 ENCOUNTER — Other Ambulatory Visit: Payer: Self-pay | Admitting: Family Medicine

## 2023-06-16 DIAGNOSIS — E7800 Pure hypercholesterolemia, unspecified: Secondary | ICD-10-CM

## 2023-06-16 DIAGNOSIS — E786 Lipoprotein deficiency: Secondary | ICD-10-CM

## 2023-06-18 ENCOUNTER — Other Ambulatory Visit: Payer: Self-pay

## 2023-06-25 ENCOUNTER — Other Ambulatory Visit: Payer: Self-pay | Admitting: Family Medicine

## 2023-06-25 DIAGNOSIS — K219 Gastro-esophageal reflux disease without esophagitis: Secondary | ICD-10-CM

## 2023-06-25 DIAGNOSIS — I10 Essential (primary) hypertension: Secondary | ICD-10-CM

## 2023-06-28 ENCOUNTER — Other Ambulatory Visit: Payer: Self-pay

## 2023-06-28 ENCOUNTER — Ambulatory Visit
Admission: EM | Admit: 2023-06-28 | Discharge: 2023-06-28 | Disposition: A | Discharge status: Home / Self Care | Payer: Medicare Other | Attending: Family Medicine | Admitting: Family Medicine

## 2023-06-28 DIAGNOSIS — B09 Unspecified viral infection characterized by skin and mucous membrane lesions: Secondary | ICD-10-CM | POA: Diagnosis not present

## 2023-06-28 DIAGNOSIS — I10 Essential (primary) hypertension: Secondary | ICD-10-CM

## 2023-06-28 DIAGNOSIS — Z0000 Encounter for general adult medical examination without abnormal findings: Secondary | ICD-10-CM

## 2023-06-28 DIAGNOSIS — R21 Rash and other nonspecific skin eruption: Secondary | ICD-10-CM

## 2023-06-28 NOTE — ED Provider Notes (Signed)
[facility]   [id number] 06/28/23 Arrival Time: 1603  ASSESSMENT & PLAN:  1. Viral exanthem   2. Rash and nonspecific skin eruption    Pending: Labs Reviewed  CBC WITH DIFFERENTIAL/PLATELET  COMPREHENSIVE METABOLIC PANEL   Will notify of any significant abnormalities.  Will follow up with PCP or here if worsening or failing to improve as anticipated. Reviewed expectations re: course of current medical issues. Questions answered. Outlined signs and symptoms indicating need for more acute intervention. Patient verbalized understanding. After Visit Summary given.   SUBJECTIVE:  Douglas Barnes is a 68 y.o. male who presents with a skin complaint. Pt presents with c/o "lines all over" his body; noted today. Recovering from recent influenza. Denies itching/fever/pain related to rash. No new medications.  OBJECTIVE: Vitals:   06/28/23 1617  BP: (!) 122/90  Pulse: (!) 129  Resp: 17  Temp: 98 F (36.7 C)  TempSrc: Oral  SpO2: 94%    Recheck P: 104 General appearance: alert; no distress HEENT: [place]; AT Neck: supple with FROM Lungs: unlabored Heart: regular Extremities: no edema; moves all extremities normally Skin: warm and dry; lacy violaceous rash over abdomen, lower back, and thighs Psychological: alert and cooperative; normal mood and affect  No Known Allergies  Past Medical History:  Diagnosis Date   BPH (benign prostatic hypertrophy) with urinary retention    Complication of anesthesia    "fear of not waking up"   Foley catheter in place    Hepatitis C antibody test positive    dx 1997--  pt is getting treatment in near future (last enzymes 12 /2016 stable per pt and in epic)   History of colon polyps    History of hypertension    no medication since 2014 lost weight and decrease stress   Hypertension    Social History   Socioeconomic History   Marital status: Married    Spouse name: Not on file   Number of children: 3   Years of  education: Not on file   Highest education level: Not on file  Occupational History   Not on file  Tobacco Use   Smoking status: Former    Current packs/day: 0.00    Average packs/day: 0.3 packs/day for 25.0 years (6.3 ttl pk-yrs)    Types: Cigarettes    Start date: 04/02/1989    Quit date: 04/02/2014    Years since quitting: 9.2   Smokeless tobacco: Never  Vaping Use   Vaping status: Never Used  Substance and Sexual Activity   Alcohol use: No   Drug use: Not Currently    Types: Marijuana   Sexual activity: Not Currently    Birth control/protection: Condom  Other Topics Concern   Not on file  Social History Narrative   Not on file   Social Drivers of Health   Financial Resource Strain: Not on file  Food Insecurity: Not on file  Transportation Needs: Not on file  Physical Activity: Not on file  Stress: Not on file  Social Connections: Not on file  Intimate Partner Violence: Not on file   Family History  Problem Relation Age of Onset   Cancer Mother 50       BONE   Cancer Father 40       COLON   Hypertension Brother    Heart disease Brother    Heart disease Brother    Hypertension Brother    Past Surgical History:  Procedure Laterality Date   ANAL FISSURE REPAIR  COLONOSCOPY WITH PROPOFOL  last one 2015   GREEN LIGHT LASER TURP (TRANSURETHRAL RESECTION OF PROSTATE N/A 07/09/2015   Procedure: GREEN LIGHT LASER TURP (TRANSURETHRAL RESECTION OF PROSTATE;  Surgeon: Jerilee Field, MD;  Location: [facility];  Service: Urology;  Laterality: N/A;   INGUINAL HERNIA REPAIR Right age 34   TRANSURETHRAL RESECTION OF PROSTATE N/A 07/09/2015   Procedure: CYSTOSCOPY WITH CLOT EVACUATION //FULGERATION OF BLEEDERS;  Surgeon: Sebastian Ache, MD;  Location: [facility];  Service: Urology;  Laterality: N/AMardella Layman, MD 06/28/23 1655

## 2023-06-28 NOTE — ED Triage Notes (Signed)
Pt presents with c/o lines all over his body. States he recently had the FLU. Pt states it does not bother him.

## 2023-06-28 NOTE — Discharge Instructions (Signed)
You have had labs (blood tests) sent today. We will call you with any significant abnormalities or if there is need to begin or change treatment or pursue further follow up.  You may also review your test results online through MyChart. If you do not have a MyChart account, instructions to sign up should be on your discharge paperwork.

## 2023-06-29 LAB — COMPREHENSIVE METABOLIC PANEL
ALT: 43 [IU]/L (ref 0–44)
AST: 52 [IU]/L — ABNORMAL HIGH (ref 0–40)
Albumin: 4.4 g/dL (ref 3.9–4.9)
Alkaline Phosphatase: 62 [IU]/L (ref 44–121)
BUN/Creatinine Ratio: 17 (ref 10–24)
BUN: 19 mg/dL (ref 8–27)
Bilirubin Total: 0.9 mg/dL (ref 0.0–1.2)
CO2: 23 mmol/L (ref 20–29)
Calcium: 8.8 mg/dL (ref 8.6–10.2)
Chloride: 97 mmol/L (ref 96–106)
Creatinine, Ser: 1.15 mg/dL (ref 0.76–1.27)
Globulin, Total: 3.2 g/dL (ref 1.5–4.5)
Glucose: 138 mg/dL — ABNORMAL HIGH (ref 70–99)
Potassium: 3.4 mmol/L — ABNORMAL LOW (ref 3.5–5.2)
Sodium: 140 mmol/L (ref 134–144)
Total Protein: 7.6 g/dL (ref 6.0–8.5)
eGFR: 70 mL/min/{1.73_m2} (ref >59)

## 2023-06-29 LAB — CBC WITH DIFFERENTIAL/PLATELET
Basophils Absolute: 0.1 10*3/uL (ref 0.0–0.2)
Basos: 1 %
EOS (ABSOLUTE): 0 10*3/uL (ref 0.0–0.4)
Eos: 0 %
Hematocrit: 50.5 % (ref 37.5–51.0)
Hemoglobin: 17.1 g/dL (ref 13.0–17.7)
Immature Grans (Abs): 0 10*3/uL (ref 0.0–0.1)
Immature Granulocytes: 0 %
Lymphocytes Absolute: 2.1 10*3/uL (ref 0.7–3.1)
Lymphs: 26 %
MCH: 31.1 pg (ref 26.6–33.0)
MCHC: 33.9 g/dL (ref 31.5–35.7)
MCV: 92 fL (ref 79–97)
Monocytes Absolute: 0.7 10*3/uL (ref 0.1–0.9)
Monocytes: 9 %
Neutrophils Absolute: 5.2 10*3/uL (ref 1.4–7.0)
Neutrophils: 64 %
Platelets: 241 10*3/uL (ref 150–450)
RBC: 5.49 x10E6/uL (ref 4.14–5.80)
RDW: 12.2 % (ref 11.6–15.4)
WBC: 8.1 10*3/uL (ref 3.4–10.8)

## 2023-07-11 ENCOUNTER — Other Ambulatory Visit: Payer: Medicare Other

## 2023-07-11 DIAGNOSIS — I10 Essential (primary) hypertension: Secondary | ICD-10-CM

## 2023-07-11 DIAGNOSIS — Z0000 Encounter for general adult medical examination without abnormal findings: Secondary | ICD-10-CM | POA: Diagnosis not present

## 2023-07-12 ENCOUNTER — Encounter: Payer: Self-pay | Admitting: Family Medicine

## 2023-07-12 LAB — COMPREHENSIVE METABOLIC PANEL
ALT: 18 [IU]/L (ref 0–44)
AST: 16 [IU]/L (ref 0–40)
Albumin: 4.3 g/dL (ref 3.9–4.9)
Alkaline Phosphatase: 69 [IU]/L (ref 44–121)
BUN/Creatinine Ratio: 15 (ref 10–24)
BUN: 15 mg/dL (ref 8–27)
Bilirubin Total: 0.7 mg/dL (ref 0.0–1.2)
CO2: 27 mmol/L (ref 20–29)
Calcium: 8.9 mg/dL (ref 8.6–10.2)
Chloride: 99 mmol/L (ref 96–106)
Creatinine, Ser: 0.98 mg/dL (ref 0.76–1.27)
Globulin, Total: 2.9 g/dL (ref 1.5–4.5)
Glucose: 118 mg/dL — ABNORMAL HIGH (ref 70–99)
Potassium: 3.6 mmol/L (ref 3.5–5.2)
Sodium: 138 mmol/L (ref 134–144)
Total Protein: 7.2 g/dL (ref 6.0–8.5)
eGFR: 85 mL/min/{1.73_m2} (ref >59)

## 2023-07-12 LAB — CBC WITH DIFFERENTIAL/PLATELET
Basophils Absolute: 0.1 10*3/uL (ref 0.0–0.2)
Basos: 1 %
EOS (ABSOLUTE): 0.2 10*3/uL (ref 0.0–0.4)
Eos: 2 %
Hematocrit: 40.7 % (ref 37.5–51.0)
Hemoglobin: 13.8 g/dL (ref 13.0–17.7)
Immature Grans (Abs): 0 10*3/uL (ref 0.0–0.1)
Immature Granulocytes: 0 %
Lymphocytes Absolute: 1.8 10*3/uL (ref 0.7–3.1)
Lymphs: 25 %
MCH: 31.2 pg (ref 26.6–33.0)
MCHC: 33.9 g/dL (ref 31.5–35.7)
MCV: 92 fL (ref 79–97)
Monocytes Absolute: 0.6 10*3/uL (ref 0.1–0.9)
Monocytes: 8 %
Neutrophils Absolute: 4.8 10*3/uL (ref 1.4–7.0)
Neutrophils: 64 %
Platelets: 393 10*3/uL (ref 150–450)
RBC: 4.43 x10E6/uL (ref 4.14–5.80)
RDW: 12.4 % (ref 11.6–15.4)
WBC: 7.5 10*3/uL (ref 3.4–10.8)

## 2023-07-12 LAB — LIPID PANEL
Chol/HDL Ratio: 2.9 {ratio} (ref 0.0–5.0)
Cholesterol, Total: 103 mg/dL (ref 100–199)
HDL: 36 mg/dL — ABNORMAL LOW (ref >39)
LDL Chol Calc (NIH): 45 mg/dL (ref 0–99)
Triglycerides: 123 mg/dL (ref 0–149)
VLDL Cholesterol Cal: 22 mg/dL (ref 5–40)

## 2023-07-12 LAB — TSH: TSH: 2.11 u[IU]/mL (ref 0.450–4.500)

## 2023-07-17 ENCOUNTER — Other Ambulatory Visit: Payer: Self-pay | Admitting: Family Medicine

## 2023-07-17 DIAGNOSIS — F410 Panic disorder [episodic paroxysmal anxiety] without agoraphobia: Secondary | ICD-10-CM

## 2023-07-17 DIAGNOSIS — F331 Major depressive disorder, recurrent, moderate: Secondary | ICD-10-CM

## 2023-07-17 DIAGNOSIS — F1210 Cannabis abuse, uncomplicated: Secondary | ICD-10-CM

## 2023-07-17 DIAGNOSIS — F4312 Post-traumatic stress disorder, chronic: Secondary | ICD-10-CM

## 2023-07-17 NOTE — Telephone Encounter (Signed)
Filled on 07/04/23, receipt confirmed by pharmacy.

## 2023-07-17 NOTE — Telephone Encounter (Signed)
Copied from CRM [id number]. Topic: Clinical - Medication Refill >> Jul 17, 2023  5:13 PM Eunice Blase wrote: Most Recent Primary Care Visit:  Provider: [facility]  Department: [facility]  Visit Type: LAB  Date: 07/11/2023  Medication: mirtazapine (REMERON) 15 MG tablet   Has the patient contacted their pharmacy? Yes (Agent: If no, request that the patient contact the pharmacy for the refill. If patient does not wish to contact the pharmacy document the reason why and proceed with request.) (Agent: If yes, when and what did the pharmacy advise?)  Is this the correct pharmacy for this prescription? Yes If no, delete pharmacy and type the correct one.  This is the patient's preferred pharmacy:    [facility] [place] - [address] [place] [address] Phone: [phone] Fax: [phone]   Has the prescription been filled recently? Yes  Is the patient out of the medication? Yes  Has the patient been seen for an appointment in the last year OR does the patient have an upcoming appointment? Yes  Can we respond through MyChart? Yes  Agent: Please be advised that Rx refills may take up to 3 business days. We ask that you follow-up with your pharmacy.

## 2023-07-18 ENCOUNTER — Encounter: Payer: Medicare Other | Admitting: Family Medicine

## 2023-07-18 ENCOUNTER — Other Ambulatory Visit: Payer: Self-pay | Admitting: Family Medicine

## 2023-07-18 DIAGNOSIS — F1210 Cannabis abuse, uncomplicated: Secondary | ICD-10-CM

## 2023-07-18 DIAGNOSIS — F4312 Post-traumatic stress disorder, chronic: Secondary | ICD-10-CM

## 2023-07-18 DIAGNOSIS — F410 Panic disorder [episodic paroxysmal anxiety] without agoraphobia: Secondary | ICD-10-CM

## 2023-07-18 DIAGNOSIS — F331 Major depressive disorder, recurrent, moderate: Secondary | ICD-10-CM

## 2023-07-18 MED ORDER — MIRTAZAPINE 15 MG PO TABS: 15.0000 mg | ORAL_TABLET | Freq: Every day | ORAL | 1 refills | Status: AC

## 2023-08-21 NOTE — Progress Notes (Unsigned)
   Annual physical  Subjective   Patient ID: Douglas Barnes, male    DOB: Feb 20, 1956  Age: 68 y.o. MRN: [id number]  No chief complaint on file.  HPI Douglas Barnes is a 68 y.o. old male here  for annual exam.   Work:*** Relationship:*** Children:*** Tobacco:*** Alcohol:*** Recreational drugs:***  Diet:*** Exercise:***  Family history of prostate or colorectal cancer:***  Advance directive:***  Other providers:***  HPI  Separate, acute concerns today: ***  The ASCVD Risk score (Arnett DK, et al., 2019) failed to calculate for the following reasons:   Risk score cannot be calculated because patient has a medical history suggesting prior/existing ASCVD  Health Maintenance Due  Topic Date Due   Medicare Annual Wellness (AWV)  Never done   Zoster Vaccines- Shingrix (1 of 2) Never done   COVID-19 Vaccine (3 - Pfizer risk series) 09/26/2019   Pneumonia Vaccine 25+ Years old (1 of 1 - PCV) Never done   INFLUENZA VACCINE  12/28/2022   Colonoscopy  11/11/2023      Objective:     There were no vitals taken for this visit. {Vitals History (Optional):23777}  Physical Exam   No results found for any visits on 08/22/23.      Assessment & Plan:   There are no diagnoses linked to this encounter.   No follow-ups on file.    Douglas Kitty, MD

## 2023-08-22 ENCOUNTER — Ambulatory Visit (INDEPENDENT_AMBULATORY_CARE_PROVIDER_SITE_OTHER): Payer: Medicare Other | Admitting: Family Medicine

## 2023-08-22 ENCOUNTER — Encounter: Payer: Self-pay | Admitting: Family Medicine

## 2023-08-22 VITALS — BP 129/97 | HR 89 | Ht 68.0 in | Wt 195.0 lb

## 2023-08-22 DIAGNOSIS — I10 Essential (primary) hypertension: Secondary | ICD-10-CM | POA: Diagnosis not present

## 2023-08-22 DIAGNOSIS — F410 Panic disorder [episodic paroxysmal anxiety] without agoraphobia: Secondary | ICD-10-CM

## 2023-08-22 DIAGNOSIS — Z0000 Encounter for general adult medical examination without abnormal findings: Secondary | ICD-10-CM | POA: Diagnosis not present

## 2023-08-22 DIAGNOSIS — E782 Mixed hyperlipidemia: Secondary | ICD-10-CM | POA: Diagnosis not present

## 2023-08-22 DIAGNOSIS — E663 Overweight: Secondary | ICD-10-CM

## 2023-08-22 DIAGNOSIS — R7303 Prediabetes: Secondary | ICD-10-CM

## 2023-08-22 LAB — POCT GLYCOSYLATED HEMOGLOBIN (HGB A1C): HbA1c POC (<> result, manual entry): 6.1 % (ref 4.0–5.6)

## 2023-08-22 NOTE — Patient Instructions (Addendum)
 It was nice to see you today,  We addressed the following topics today: -Your A1c was 6.1 which was what it was last year. - For your weight gain if we are not going to do a referral to a nutritionist for medications, I would recommend downloading a calorie counting app such as lose it, cronometer, or my fitness pal.  For 1 week I would like you to just document what you eat, everything you eat or drink every day without adjusting your diet. - Then once you get a baseline of your typical calorie consumption you can subtract 500 cal from that per day as a starting goal.  Or you can use the apps built in features and set your desired weight and timeframe and they can set the ideal calories per day. - For panic attacks I would recommend a daily SSRI medication. - I would also recommend talking to a therapist that specializes in cognitive behavioral therapy for panic disorder.  You can find someone like this by searching on [email or web address], typing in the ZIP Code and then filtering the results by CBT and panic disorder.  Have a great day,  Frederic Jericho, MD

## 2023-08-22 NOTE — Assessment & Plan Note (Signed)
 Compliant with his blood pressure medication.  Elevated today, especially the diastolic.  Advised patient to check his blood pressure at home regularly for the next 2 weeks and return the values to [place] so that we can decide if adjustments are needed.

## 2023-08-22 NOTE — Assessment & Plan Note (Signed)
 Patient has put on 20 pounds since starting Remeron.  He does not want to come off the Remeron due to its effectiveness with sleep.  We discussed other medication options.  These are limited.  He is not interested in referral to nutritional therapy or dietitian.  We discussed calorie counting using an app.  Recommended estimating his daily caloric intake for 1 week prior to dieting using the app and then adjusting accordingly.

## 2023-08-22 NOTE — Assessment & Plan Note (Signed)
 Occurring more frequently.  Patient denies any changes to stress or anxiety elsewhere in his life.  They occur at random times.  We discussed medication options.  Patient declines medications at this time.  We discussed therapy options.  Recommended patient seek out therapist who specializes in cognitive behavioral therapy for panic disorder.  Patient will consider this.  Continue deep breathing exercises as needed.

## 2023-08-22 NOTE — Assessment & Plan Note (Signed)
 Cholesterol level at goal.  Continue atorvastatin 80 mg.

## 2023-10-02 ENCOUNTER — Other Ambulatory Visit: Payer: Self-pay | Admitting: Urology

## 2023-10-02 DIAGNOSIS — C61 Malignant neoplasm of prostate: Secondary | ICD-10-CM

## 2023-11-04 ENCOUNTER — Ambulatory Visit
Admission: RE | Admit: 2023-11-04 | Discharge: 2023-11-04 | Disposition: A | Discharge status: Home / Self Care | Source: Ambulatory Visit | Attending: Urology | Admitting: Urology

## 2023-11-04 DIAGNOSIS — C61 Malignant neoplasm of prostate: Secondary | ICD-10-CM

## 2023-11-04 MED ORDER — GADOPICLENOL 0.5 MMOL/ML IV SOLN
9.0000 mL | Freq: Once | INTRAVENOUS | Status: AC | PRN
  Administered 2023-11-04: 9 mL via INTRAVENOUS

## 2023-11-19 DIAGNOSIS — R3912 Poor urinary stream: Secondary | ICD-10-CM | POA: Diagnosis not present

## 2024-01-29 ENCOUNTER — Telehealth: Payer: Self-pay | Admitting: *Deleted

## 2024-01-29 NOTE — Telephone Encounter (Signed)
 Copied from CRM [id number]. Topic: Appointments - Scheduling Inquiry for Clinic >> Jan 29, 2024  9:48 AM Mesmerise C wrote: Reason for CRM: Patient returning a call from [facility] advised was to schedule his AWV tried to schedule but showing no open availability for the rest of the year or the next please give patient a call at [phone] to schedule

## 2024-03-05 ENCOUNTER — Ambulatory Visit (INDEPENDENT_AMBULATORY_CARE_PROVIDER_SITE_OTHER): Admitting: Podiatry

## 2024-03-05 DIAGNOSIS — M722 Plantar fascial fibromatosis: Secondary | ICD-10-CM | POA: Diagnosis not present

## 2024-03-05 NOTE — Progress Notes (Signed)
 Subjective:  Patient ID: Douglas Barnes, male    DOB: 05/27/1956,  MRN: [id number]  Chief Complaint  Patient presents with   Foot Pain    Pt stated that he has been having some discomfort in his feet he stated that it feels like he has knots in his feet     68 y.o. male presents with the above complaint.  Patient presents with right heel pain that just came out of nowhere hurts with ambulation worse with pressure there are some discomfort.  He feels like there is a knot in his foot has not seen MRIs prior to seeing me denies any other acute complaints pain scale 7 out of 10 dull aching nature   Review of Systems: Negative except as noted in the HPI. Denies N/V/F/Ch.  Past Medical History:  Diagnosis Date   BPH (benign prostatic hypertrophy) with urinary retention    Complication of anesthesia    fear of not waking up   Foley catheter in place    Hepatitis C antibody test positive    dx 1997--  pt is getting treatment in near future (last enzymes 12 /2016 stable per pt and in epic)   History of colon polyps    History of hypertension    no medication since 2014 lost weight and decrease stress   Hypertension     Current Outpatient Medications:    Acetaminophen  500 MG capsule, Take by mouth., Disp: , Rfl:    atorvastatin  (LIPITOR) 80 MG tablet, Take 1 tablet (80 mg total) by mouth daily., Disp: 90 tablet, Rfl: 3   finasteride (PROSCAR) 5 MG tablet, Take 5 mg by mouth daily., Disp: , Rfl: 3   ibuprofen  (ADVIL ) 200 MG tablet, 4-6 tablets, Disp: , Rfl:    losartan -hydrochlorothiazide  (HYZAAR) 100-25 MG tablet, TAKE 1 TABLET BY MOUTH DAILY, Disp: 70 tablet, Rfl: 4   mirtazapine  (REMERON ) 15 MG tablet, Take 1 tablet (15 mg total) by mouth at bedtime., Disp: 90 tablet, Rfl: 1   omeprazole  (PRILOSEC) 20 MG capsule, TAKE 1 CAPSULE BY MOUTH AT  BEDTIME, Disp: 70 capsule, Rfl: 4   promethazine -dextromethorphan (PROMETHAZINE -DM) 6.25-15 MG/5ML syrup, Take 5 mLs by mouth at bedtime as  needed for cough., Disp: 118 mL, Rfl: 0   sildenafil  (VIAGRA ) 100 MG tablet, 1/2 TABLET EVERY DAY AS NEEDED, Disp: 10 tablet, Rfl: 1  Social History   Tobacco Use  Smoking Status Former   Current packs/day: 0.00   Average packs/day: 0.3 packs/day for 25.0 years (6.3 ttl pk-yrs)   Types: Cigarettes   Start date: 04/02/1989   Quit date: 04/02/2014   Years since quitting: 9.9  Smokeless Tobacco Never    No Known Allergies Objective:  There were no vitals filed for this visit. There is no height or weight on file to calculate BMI. Constitutional Well developed. Well nourished.  Vascular Dorsalis pedis pulses palpable bilaterally. Posterior tibial pulses palpable bilaterally. Capillary refill normal to all digits.  No cyanosis or clubbing noted. Pedal hair growth normal.  Neurologic Normal speech. Oriented to person, place, and time. Epicritic sensation to light touch grossly present bilaterally.  Dermatologic Nails well groomed and normal in appearance. No open wounds. No skin lesions.  Orthopedic: Normal joint ROM without pain or crepitus bilaterally. No visible deformities. Tender to palpation at the calcaneal tuber right. No pain with calcaneal squeeze right. Ankle ROM diminished range of motion right. Silfverskiold Test: positive right.   Radiographs: None  Assessment:  No diagnosis found. Plan:  Patient  was evaluated and treated and all questions answered.  Plantar Fasciitis, right - XR reviewed as above.  - Educated on icing and stretching. Instructions given.  - Injection delivered to the plantar fascia as below. - DME: Plantar fascial brace dispensed to support the medial longitudinal arch of the foot and offload pressure from the heel and prevent arch collapse during weightbearing - Pharmacologic management: None  Procedure: Injection Tendon/Ligament Location: Right plantar fascia at the glabrous junction; medial approach. Skin Prep: alcohol Injectate: 0.5  cc 0.5% marcaine plain, 0.5 cc of 1% Lidocaine , 0.5 cc kenalog 10. Disposition: Patient tolerated procedure well. Injection site dressed with a band-aid.  No follow-ups on file.

## 2024-03-19 ENCOUNTER — Other Ambulatory Visit: Payer: Self-pay | Admitting: Family Medicine

## 2024-03-19 DIAGNOSIS — K219 Gastro-esophageal reflux disease without esophagitis: Secondary | ICD-10-CM

## 2024-03-19 DIAGNOSIS — I10 Essential (primary) hypertension: Secondary | ICD-10-CM

## 2024-04-02 ENCOUNTER — Ambulatory Visit: Admitting: Podiatry

## 2024-04-02 DIAGNOSIS — M722 Plantar fascial fibromatosis: Secondary | ICD-10-CM

## 2024-04-02 NOTE — Progress Notes (Unsigned)
 Subjective:  Patient ID: Douglas Barnes, male    DOB: 1955-10-08,  MRN: [id number]  Chief Complaint  Patient presents with   Plantar Fasciitis    Pt stated that he is doing much better     68 y.o. male presents with the above complaint.  Patient presents with right plantar fasciitis he states he is doing a lot better.  Injection helped considerably he does not have any pain he is 100% improved denies any other acute complaints  Review of Systems: Negative except as noted in the HPI. Denies N/V/F/Ch.  Past Medical History:  Diagnosis Date   BPH (benign prostatic hypertrophy) with urinary retention    Complication of anesthesia    fear of not waking up   Foley catheter in place    Hepatitis C antibody test positive    dx 1997--  pt is getting treatment in near future (last enzymes 12 /2016 stable per pt and in epic)   History of colon polyps    History of hypertension    no medication since 2014 lost weight and decrease stress   Hypertension     Current Outpatient Medications:    Acetaminophen  500 MG capsule, Take by mouth., Disp: , Rfl:    atorvastatin  (LIPITOR) 80 MG tablet, Take 1 tablet (80 mg total) by mouth daily., Disp: 90 tablet, Rfl: 3   finasteride (PROSCAR) 5 MG tablet, Take 5 mg by mouth daily., Disp: , Rfl: 3   ibuprofen  (ADVIL ) 200 MG tablet, 4-6 tablets, Disp: , Rfl:    losartan -hydrochlorothiazide  (HYZAAR) 100-25 MG tablet, TAKE 1 TABLET BY MOUTH DAILY, Disp: 100 tablet, Rfl: 2   mirtazapine  (REMERON ) 15 MG tablet, Take 1 tablet (15 mg total) by mouth at bedtime., Disp: 90 tablet, Rfl: 1   omeprazole  (PRILOSEC) 20 MG capsule, TAKE 1 CAPSULE BY MOUTH AT  BEDTIME, Disp: 100 capsule, Rfl: 2   promethazine -dextromethorphan (PROMETHAZINE -DM) 6.25-15 MG/5ML syrup, Take 5 mLs by mouth at bedtime as needed for cough., Disp: 118 mL, Rfl: 0   sildenafil  (VIAGRA ) 100 MG tablet, 1/2 TABLET EVERY DAY AS NEEDED, Disp: 10 tablet, Rfl: 1  Social History   Tobacco Use   Smoking Status Former   Current packs/day: 0.00   Average packs/day: 0.3 packs/day for 25.0 years (6.3 ttl pk-yrs)   Types: Cigarettes   Start date: 04/02/1989   Quit date: 04/02/2014   Years since quitting: 10.0  Smokeless Tobacco Never    No Known Allergies Objective:  There were no vitals filed for this visit. There is no height or weight on file to calculate BMI. Constitutional Well developed. Well nourished.  Vascular Dorsalis pedis pulses palpable bilaterally. Posterior tibial pulses palpable bilaterally. Capillary refill normal to all digits.  No cyanosis or clubbing noted. Pedal hair growth normal.  Neurologic Normal speech. Oriented to person, place, and time. Epicritic sensation to light touch grossly present bilaterally.  Dermatologic Nails well groomed and normal in appearance. No open wounds. No skin lesions.  Orthopedic: Normal joint ROM without pain or crepitus bilaterally. No visible deformities. No further tender to palpation at the calcaneal tuber right. No pain with calcaneal squeeze right. Ankle ROM diminished range of motion right. Silfverskiold Test: positive right.   Radiographs: None  Assessment:   1. Plantar fasciitis of right foot    Plan:  Patient was evaluated and treated and all questions answered.  Plantar Fasciitis, right - Clinically healed and officially discharged from my care if any foot issues in the future he will  come back and see me.  He states that 1 injection resulted in 100% improvement in pain.  At this time I discussed prevention techniques along with shoe gear modification orthotics options he states understanding.  No follow-ups on file.

## 2024-05-02 ENCOUNTER — Other Ambulatory Visit: Payer: Self-pay | Admitting: Family Medicine

## 2024-05-02 DIAGNOSIS — M5416 Radiculopathy, lumbar region: Secondary | ICD-10-CM

## 2024-05-07 ENCOUNTER — Inpatient Hospital Stay: Admission: RE | Admit: 2024-05-07 | Discharge: 2024-05-07 | Attending: Family Medicine | Admitting: Family Medicine

## 2024-05-07 DIAGNOSIS — M5416 Radiculopathy, lumbar region: Secondary | ICD-10-CM

## 2024-05-08 ENCOUNTER — Encounter

## 2024-05-18 ENCOUNTER — Other Ambulatory Visit

## 2024-06-11 ENCOUNTER — Other Ambulatory Visit: Payer: Self-pay | Admitting: Orthopedic Surgery

## 2024-06-18 ENCOUNTER — Telehealth: Payer: Self-pay | Admitting: Family Medicine

## 2024-06-18 NOTE — Telephone Encounter (Signed)
 Can you look in the media tab and see the most recent message from [facility] regarding sending in a referral online to [email or web address]  I don't have a account with them set up so I set one up but now it is saying I need to associated it with a tax id number and who knows what else before they'll actually let me submit the referral online.  So can you call his insurance company and tell them I'll need a fax number and I'll just fax them the referral like we've been doing for the past 50 years?

## 2024-06-19 ENCOUNTER — Other Ambulatory Visit: Payer: Self-pay | Admitting: Family Medicine

## 2024-06-19 DIAGNOSIS — I10 Essential (primary) hypertension: Secondary | ICD-10-CM

## 2024-06-19 DIAGNOSIS — K219 Gastro-esophageal reflux disease without esophagitis: Secondary | ICD-10-CM

## 2024-06-19 DIAGNOSIS — E786 Lipoprotein deficiency: Secondary | ICD-10-CM

## 2024-06-19 DIAGNOSIS — E7800 Pure hypercholesterolemia, unspecified: Secondary | ICD-10-CM

## 2024-06-19 MED ORDER — ATORVASTATIN CALCIUM 80 MG PO TABS: 80.0000 mg | ORAL_TABLET | Freq: Every day | ORAL | 3 refills | Status: AC

## 2024-06-19 MED ORDER — OMEPRAZOLE 20 MG PO CPDR: 20.0000 mg | DELAYED_RELEASE_CAPSULE | Freq: Every day | ORAL | 3 refills | Status: AC

## 2024-06-19 MED ORDER — LOSARTAN POTASSIUM-HCTZ 100-25 MG PO TABS: 1.0000 | ORAL_TABLET | Freq: Every day | ORAL | 3 refills | Status: AC

## 2024-06-23 ENCOUNTER — Other Ambulatory Visit: Payer: Self-pay | Admitting: Family Medicine

## 2024-06-23 DIAGNOSIS — M5416 Radiculopathy, lumbar region: Secondary | ICD-10-CM

## 2024-06-23 NOTE — Telephone Encounter (Signed)
 LVM for pt to call and let us  know which insurance he currently has.  We show devoted on side bar but do not have a copy of a card and we have a [employer] card in media.

## 2024-06-23 NOTE — Telephone Encounter (Signed)
 I have put in the referral to Dr. Avanell through epic

## 2024-06-24 ENCOUNTER — Other Ambulatory Visit: Payer: Self-pay | Admitting: Family Medicine

## 2024-06-24 ENCOUNTER — Encounter: Payer: Self-pay | Admitting: Family Medicine

## 2024-06-24 MED ORDER — ALPRAZOLAM 1 MG PO TABS: 1.0000 mg | ORAL_TABLET | Freq: Every day | ORAL | 0 refills | Status: AC | PRN

## 2024-06-26 ENCOUNTER — Emergency Department (HOSPITAL_COMMUNITY)

## 2024-06-26 ENCOUNTER — Emergency Department (HOSPITAL_COMMUNITY)
Admission: EM | Admit: 2024-06-26 | Discharge: 2024-06-26 | Discharge status: Against Medical Advice | Attending: Emergency Medicine | Admitting: Emergency Medicine

## 2024-06-26 ENCOUNTER — Ambulatory Visit (HOSPITAL_COMMUNITY): Admission: RE | Admit: 2024-06-26 | Source: Home / Self Care | Admitting: Orthopedic Surgery

## 2024-06-26 ENCOUNTER — Encounter (HOSPITAL_COMMUNITY): Admission: RE | Payer: Self-pay | Source: Home / Self Care

## 2024-06-26 ENCOUNTER — Other Ambulatory Visit: Payer: Self-pay

## 2024-06-26 ENCOUNTER — Encounter (HOSPITAL_COMMUNITY): Payer: Self-pay

## 2024-06-26 DIAGNOSIS — Z5321 Procedure and treatment not carried out due to patient leaving prior to being seen by health care provider: Secondary | ICD-10-CM | POA: Insufficient documentation

## 2024-06-26 DIAGNOSIS — M545 Low back pain, unspecified: Secondary | ICD-10-CM | POA: Insufficient documentation

## 2024-06-26 SURGERY — POSTERIOR LUMBAR FUSION 1 LEVEL
Anesthesia: General

## 2024-06-26 MED ORDER — OXYCODONE-ACETAMINOPHEN 5-325 MG PO TABS
1.0000 | ORAL_TABLET | Freq: Once | ORAL | Status: AC
  Administered 2024-06-26: 1 via ORAL
  Filled 2024-06-26: qty 1

## 2024-06-26 NOTE — ED Triage Notes (Signed)
 Pt BIB [facility] from work where he had both feet slip out from under him on the ice while he was taking the trash. Landed on his lower back/buttocks & denies hitting his head, not on thinners. Does have Hx of back problems & had an injection placed in it last week (per EMS). A/Ox4, VSS.

## 2024-06-26 NOTE — ED Notes (Signed)
 Pt stated he could not wait any longer and was leaving. Moved OTF.

## 2024-06-26 NOTE — ED Provider Triage Note (Signed)
 Emergency Medicine Provider Triage Evaluation Note  Douglas Barnes , a 69 y.o. male  was evaluated in triage.  Pt complains of low back pain after slip and fall onto ice, hit buttocks. Hx of ddd in lumbar spine. No head injury, not on blood thinners. Rates pain 9/10.  No numbness, tingling, no bowel or bladder incontinence.  Review of Systems  Positive: Lumbar back pain Negative:   Physical Exam  BP (!) 143/100 (BP Location: Left Arm)   Pulse 87   Temp 98.4 F (36.9 C) (Oral)   Resp 14   Ht 5' 8 (1.727 m)   Wt 83.9 kg   SpO2 100%   BMI 28.13 kg/m  Gen:   Awake, no distress   Resp:  Normal effort  MSK:   Moves extremities without difficulty  Other:  Significant ttp in lumbar spine, midline and paraspinous muscles, normal sensation throughout  Medical Decision Making  Medically screening exam initiated at 10:42 AM.  Appropriate orders placed.  Douglas Barnes was informed that the remainder of the evaluation will be completed by another provider, this initial triage assessment does not replace that evaluation, and the importance of remaining in the ED until their evaluation is complete.  Workup initiated in triage   Douglas Barnes, [place] 06/26/24 1042

## 2024-08-14 ENCOUNTER — Other Ambulatory Visit

## 2024-08-21 ENCOUNTER — Encounter: Admitting: Family Medicine

## 2024-08-26 ENCOUNTER — Encounter: Admitting: Family Medicine
